# Patient Record
Sex: Female | Born: 1960 | Race: White | Hispanic: No | Marital: Married | Smoking: Former smoker
Health system: Southern US, Community
[De-identification: ages and names within clinical notes are randomized; demographics above are authoritative.]

## PROBLEM LIST (undated history)

## (undated) DIAGNOSIS — R42 Dizziness and giddiness: Secondary | ICD-10-CM

## (undated) DIAGNOSIS — T753XXA Motion sickness, initial encounter: Secondary | ICD-10-CM

## (undated) DIAGNOSIS — J189 Pneumonia, unspecified organism: Secondary | ICD-10-CM

## (undated) DIAGNOSIS — I10 Essential (primary) hypertension: Secondary | ICD-10-CM

## (undated) DIAGNOSIS — L409 Psoriasis, unspecified: Secondary | ICD-10-CM

## (undated) DIAGNOSIS — E785 Hyperlipidemia, unspecified: Secondary | ICD-10-CM

## (undated) DIAGNOSIS — Z972 Presence of dental prosthetic device (complete) (partial): Secondary | ICD-10-CM

## (undated) DIAGNOSIS — M8580 Other specified disorders of bone density and structure, unspecified site: Secondary | ICD-10-CM

## (undated) HISTORY — DX: Hyperlipidemia, unspecified: E78.5

## (undated) HISTORY — DX: Psoriasis, unspecified: L40.9

## (undated) HISTORY — DX: Other specified disorders of bone density and structure, unspecified site: M85.80

## (undated) HISTORY — PX: TUBAL LIGATION: SHX77

---

## 1988-04-09 HISTORY — PX: CHOLECYSTECTOMY: SHX55

## 2004-03-06 ENCOUNTER — Emergency Department: Payer: Self-pay | Admitting: Emergency Medicine

## 2004-03-07 ENCOUNTER — Emergency Department (HOSPITAL_COMMUNITY): Admission: EM | Admit: 2004-03-07 | Discharge: 2004-03-07 | Payer: Self-pay | Admitting: Emergency Medicine

## 2004-03-07 ENCOUNTER — Emergency Department: Payer: Self-pay | Admitting: General Practice

## 2009-06-14 ENCOUNTER — Ambulatory Visit: Payer: Self-pay | Admitting: Family Medicine

## 2010-10-03 ENCOUNTER — Ambulatory Visit: Payer: Self-pay | Admitting: Nephrology

## 2012-12-11 ENCOUNTER — Ambulatory Visit: Payer: Self-pay | Admitting: Family Medicine

## 2013-09-26 ENCOUNTER — Emergency Department: Payer: Self-pay | Admitting: Emergency Medicine

## 2014-06-22 ENCOUNTER — Encounter: Payer: Self-pay | Admitting: General Surgery

## 2014-06-22 ENCOUNTER — Ambulatory Visit (INDEPENDENT_AMBULATORY_CARE_PROVIDER_SITE_OTHER): Payer: BLUE CROSS/BLUE SHIELD | Admitting: General Surgery

## 2014-06-22 VITALS — BP 130/80 | HR 76 | Resp 12 | Ht 64.0 in | Wt 166.0 lb

## 2014-06-22 DIAGNOSIS — Z800 Family history of malignant neoplasm of digestive organs: Secondary | ICD-10-CM

## 2014-06-22 DIAGNOSIS — Z1211 Encounter for screening for malignant neoplasm of colon: Secondary | ICD-10-CM | POA: Diagnosis not present

## 2014-06-22 MED ORDER — POLYETHYLENE GLYCOL 3350 17 GM/SCOOP PO POWD: 1.0000 | Freq: Once | ORAL | Status: DC

## 2014-06-22 NOTE — Progress Notes (Signed)
Patient ID: Tanya Howell, female   DOB: August 03, 1960, 54 y.o.   MRN: [id number]  Chief Complaint  Patient presents with  . Other    screening colonoscopy    HPI Tanya Howell is a 54 y.o. female here today for evaluation of a screening colonoscopy. Patient states she has no GI problems except she noted a change in caliber of stool in recent past. Her brother had colon cancer. HPI  Past Medical History  Diagnosis Date  . Hyperlipidemia     Past Surgical History  Procedure Laterality Date  . Cholecystectomy  1990  . Tubal ligation      Family History  Problem Relation Age of Onset  . Colon cancer Brother     57   . Breast cancer Mother     Social History History  Substance Use Topics  . Smoking status: Former Smoker -- 1.00 packs/day for 28 years    Types: Cigarettes  . Smokeless tobacco: Not on file  . Alcohol Use: Not on file    No Known Allergies  Current Outpatient Prescriptions  Medication Sig Dispense Refill  . docusate sodium (COLACE) 100 MG capsule Take 100 mg by mouth daily.    . meclizine (ANTIVERT) 25 MG tablet Take 25 mg by mouth as needed for dizziness.    . polyethylene glycol powder (GLYCOLAX/MIRALAX) powder Take 255 g by mouth once. 255 g 0   No current facility-administered medications for this visit.    Review of Systems Review of Systems  Constitutional: Negative.   Respiratory: Negative.   Cardiovascular: Negative.   Gastrointestinal: Positive for constipation. Negative for nausea, vomiting, abdominal pain, diarrhea, blood in stool, abdominal distention, anal bleeding and rectal pain.    Blood pressure 130/80, pulse 76, resp. rate 12, height 5\' 4"  (1.626 m), weight 166 lb (75.297 kg).  Physical Exam Physical Exam  Constitutional: She is oriented to person, place, and time. She appears well-developed and well-nourished.  Eyes: Conjunctivae are normal. No scleral icterus.  Neck: Neck supple.  Cardiovascular: Normal rate, regular  rhythm and normal heart sounds.   Pulmonary/Chest: Effort normal and breath sounds normal.  Abdominal: Soft. Bowel sounds are normal. There is no tenderness.  Lymphadenopathy:    She has no cervical adenopathy.  Neurological: She is alert and oriented to person, place, and time.  Skin: Skin is warm and dry.    Data Reviewed Notes reviewed  Assessment FH of colon cancer. Pt needs colonoscopy    Plan    Discussed colonoscopy, procedure, risks and benefits explained. Pt agreeable. Patient is scheduled for a Colonoscopy at [facility] on 07/07/14. She is aware to pre register with the hospital at least 2 days prior. Miralax prescription has been sent into her pharmacy. Patient is aware of date and all instructions.        Keyla Milone G 06/23/2014, 2:58 PM

## 2014-06-22 NOTE — Patient Instructions (Addendum)
Colonoscopy A colonoscopy is an exam to look at the entire large intestine (colon). This exam can help find problems such as tumors, polyps, inflammation, and areas of bleeding. The exam takes about 1 hour.  LET [facility] CARE PROVIDER KNOW ABOUT:   Any allergies you have.  All medicines you are taking, including vitamins, herbs, eye drops, creams, and over-the-counter medicines.  Previous problems you or members of your family have had with the use of anesthetics.  Any blood disorders you have.  Previous surgeries you have had.  Medical conditions you have. RISKS AND COMPLICATIONS  Generally, this is a safe procedure. However, as with any procedure, complications can occur. Possible complications include:  Bleeding.  Tearing or rupture of the colon wall.  Reaction to medicines given during the exam.  Infection (rare). BEFORE THE PROCEDURE   Ask your health care provider about changing or stopping your regular medicines.  You may be prescribed an oral bowel prep. This involves drinking a large amount of medicated liquid, starting the day before your procedure. The liquid will cause you to have multiple loose stools until your stool is almost clear or light green. This cleans out your colon in preparation for the procedure.  Do not eat or drink anything else once you have started the bowel prep, unless your health care provider tells you it is safe to do so.  Arrange for someone to drive you home after the procedure. PROCEDURE   You will be given medicine to help you relax (sedative).  You will lie on your side with your knees bent.  A long, flexible tube with a light and camera on the end (colonoscope) will be inserted through the rectum and into the colon. The camera sends video back to a computer screen as it moves through the colon. The colonoscope also releases carbon dioxide gas to inflate the colon. This helps your health care provider see the area better.  During  the exam, your health care provider may take a small tissue sample (biopsy) to be examined under a microscope if any abnormalities are found.  The exam is finished when the entire colon has been viewed. AFTER THE PROCEDURE   Do not drive for 24 hours after the exam.  You may have a small amount of blood in your stool.  You may pass moderate amounts of gas and have mild abdominal cramping or bloating. This is caused by the gas used to inflate your colon during the exam.  Ask when your test results will be ready and how you will get your results. Make sure you get your test results. Document Released: 03/23/2000 Document Revised: 01/14/2013 Document Reviewed: 12/01/2012 [facility] Patient Information 2015 [place], [place]. This information is not intended to replace advice given to you by your health care provider. Make sure you discuss any questions you have with your health care provider.  Patient is scheduled for a Colonoscopy at [facility] on 07/07/14. She is aware to pre register with the hospital at least 2 days prior. Miralax prescription has been sent into her pharmacy. Patient is aware of date and all instructions.

## 2014-06-23 ENCOUNTER — Encounter: Payer: Self-pay | Admitting: General Surgery

## 2014-06-30 ENCOUNTER — Other Ambulatory Visit: Payer: Self-pay | Admitting: General Surgery

## 2014-06-30 DIAGNOSIS — Z1211 Encounter for screening for malignant neoplasm of colon: Secondary | ICD-10-CM

## 2014-07-07 ENCOUNTER — Ambulatory Visit
Admit: 2014-07-07 | Disposition: A | Discharge status: Home / Self Care | Payer: Self-pay | Attending: General Surgery | Admitting: General Surgery

## 2014-07-07 DIAGNOSIS — D125 Benign neoplasm of sigmoid colon: Secondary | ICD-10-CM | POA: Diagnosis not present

## 2014-07-07 DIAGNOSIS — Z1211 Encounter for screening for malignant neoplasm of colon: Secondary | ICD-10-CM | POA: Diagnosis not present

## 2014-07-09 ENCOUNTER — Encounter: Payer: Self-pay | Admitting: General Surgery

## 2014-07-13 ENCOUNTER — Telehealth: Payer: Self-pay | Admitting: *Deleted

## 2014-07-13 NOTE — Telephone Encounter (Signed)
-----   Message from Christene Lye, MD sent at 07/09/2014 12:48 PM EDT ----- Please let pt pt know the pathology was normal. She will need colonoscopy in 5 yrs

## 2014-07-13 NOTE — Telephone Encounter (Signed)
Pt placed in recalls.

## 2014-07-13 NOTE — Telephone Encounter (Signed)
Pt aware for results and that she needs to repeat colonoscopy in 5 years, pt placed in recalls

## 2014-08-02 LAB — SURGICAL PATHOLOGY

## 2014-11-01 ENCOUNTER — Telehealth: Payer: Self-pay | Admitting: Family Medicine

## 2014-11-01 MED ORDER — SCOPOLAMINE 1 MG/3DAYS TD PT72: 1.0000 | MEDICATED_PATCH | TRANSDERMAL | Status: DC

## 2014-11-01 NOTE — Telephone Encounter (Signed)
okay

## 2014-11-01 NOTE — Telephone Encounter (Signed)
PT CALLED IN REQUESTING PATCH FOR MOTION SICKNESS. SHE WOULD LIKE IT TO GO TO [address].

## 2015-11-01 ENCOUNTER — Other Ambulatory Visit: Payer: Self-pay | Admitting: Family Medicine

## 2015-11-01 ENCOUNTER — Encounter: Payer: Self-pay | Admitting: Family Medicine

## 2015-11-01 ENCOUNTER — Ambulatory Visit (INDEPENDENT_AMBULATORY_CARE_PROVIDER_SITE_OTHER): Payer: BLUE CROSS/BLUE SHIELD | Admitting: Family Medicine

## 2015-11-01 DIAGNOSIS — Z1239 Encounter for other screening for malignant neoplasm of breast: Secondary | ICD-10-CM | POA: Diagnosis not present

## 2015-11-01 DIAGNOSIS — Z0000 Encounter for general adult medical examination without abnormal findings: Secondary | ICD-10-CM | POA: Diagnosis not present

## 2015-11-01 DIAGNOSIS — Z124 Encounter for screening for malignant neoplasm of cervix: Secondary | ICD-10-CM | POA: Insufficient documentation

## 2015-11-01 DIAGNOSIS — N889 Noninflammatory disorder of cervix uteri, unspecified: Secondary | ICD-10-CM | POA: Insufficient documentation

## 2015-11-01 DIAGNOSIS — M8580 Other specified disorders of bone density and structure, unspecified site: Secondary | ICD-10-CM

## 2015-11-01 HISTORY — DX: Other specified disorders of bone density and structure, unspecified site: M85.80

## 2015-11-01 NOTE — Assessment & Plan Note (Signed)
Order DEXA, weight-bearing exercise, 3 servings calcium daily; 1000 iu vit D3 daily when not outdoors

## 2015-11-01 NOTE — Assessment & Plan Note (Signed)
Order mammo 

## 2015-11-01 NOTE — Assessment & Plan Note (Signed)
Refer to GYN. 

## 2015-11-01 NOTE — Progress Notes (Signed)
Patient ID: Tanya Howell, female   DOB: May 20, 1960, 55 y.o.   MRN: [id number]   Subjective:   Tanya Howell is a 55 y.o. female here for a complete physical exam  Interim issues since last visit: had some dental surgery, bottom row pulled; now all dentures; fitting fair, new since May  USPSTF grade A and B recommendations Alcohol: no Depression:  Depression screen [facility] 2/9 11/01/2015  Decreased Interest 0  Down, Depressed, Hopeless 0  PHQ - 2 Score 0   Hypertension: controlled Obesity: overweight, not obese; weight loss since last visit Tobacco use: former smoker; quit 2011 HIV, hep B, hep C: declined STD testing and prevention (chl/gon/syphilis): declined Lipids: today Glucose: today, last in Jan 103 Colorectal cancer: just done 2016, next due 2021 Breast cancer: order BRCA gene screening:no ovarian cancer Intimate partner violence: no Cervical cancer screening: due today Lung cancer: start low dose chest CT at age 70 Osteoporosis: no steroids; osteopenia previously, order DEXA Fall prevention/vitamin D: start 1000 iu  AAA: n/a Aspirin: n/a Diet: room for improvement Exercise: no regular exercise, but goes hiking, tries to stay active Skin cancer: spots on back   Past Medical History:  Diagnosis Date  . Hyperlipidemia   . Osteopenia 11/01/2015   Past Surgical History:  Procedure Laterality Date  . CHOLECYSTECTOMY  1990  . TUBAL LIGATION    MD notes: dental extraction  Family History  Problem Relation Age of Onset  . Colon cancer Brother     20   . Breast cancer Mother    Social History  Substance Use Topics  . Smoking status: Former Smoker    Packs/day: 1.00    Years: 28.00    Types: Cigarettes  . Smokeless tobacco: Not on file  . Alcohol use Not on file   Review of Systems  Constitutional: Negative for unexpected weight change (lost some with dentures).  Respiratory: Negative for cough and shortness of breath.   Cardiovascular: Negative for  chest pain and leg swelling.  Gastrointestinal: Negative for blood in stool.  Genitourinary: Negative for hematuria and pelvic pain.  Hematological: Does not bruise/bleed easily.   Objective:   Vitals:   11/01/15 1134  BP: 118/82  Pulse: 85  Resp: 14  Temp: 99.1 F (37.3 C)  TempSrc: Oral  SpO2: 96%  Weight: 158 lb (71.7 kg)   Body mass index is 27.12 kg/m. Wt Readings from Last 3 Encounters:  11/01/15 158 lb (71.7 kg)  06/22/14 166 lb (75.3 kg)   Physical Exam  Constitutional: She appears well-developed and well-nourished.  HENT:  Head: Normocephalic and atraumatic.  Eyes: Conjunctivae and EOM are normal. Right eye exhibits no hordeolum. Left eye exhibits no hordeolum. No scleral icterus.  Neck: Carotid bruit is not present. No thyromegaly present.  Cardiovascular: Normal rate, regular rhythm, S1 normal, S2 normal and normal heart sounds.   No extrasystoles are present.  Pulmonary/Chest: Effort normal and breath sounds normal. No respiratory distress. Right breast exhibits no inverted nipple, no mass, no nipple discharge, no skin change and no tenderness. Left breast exhibits no inverted nipple, no mass, no nipple discharge, no skin change and no tenderness. Breasts are symmetrical.  Abdominal: Soft. Normal appearance and bowel sounds are normal. She exhibits no distension, no abdominal bruit, no pulsatile midline mass and no mass. There is no hepatosplenomegaly. There is no tenderness. No hernia.  Genitourinary: Uterus normal. Pelvic exam was performed with patient prone. There is no rash or lesion on the right labia.  There is no rash or lesion on the left labia. Cervix exhibits no motion tenderness, no discharge and no friability. Right adnexum displays no mass, no tenderness and no fullness. Left adnexum displays no mass, no tenderness and no fullness. No erythema or bleeding in the vagina. No vaginal discharge found.  Genitourinary Comments: Transition zone versus abnormal  tissue at os with what appears to be polyp or irregular erythematous tissue, almost glandular in appearance at the 7 and 8 o'clock region of the cervix  Musculoskeletal: Normal range of motion. She exhibits no edema.  Lymphadenopathy:       Head (right side): No submandibular adenopathy present.       Head (left side): No submandibular adenopathy present.    She has no cervical adenopathy.    She has no axillary adenopathy.  Neurological: She is alert. She displays no tremor. No cranial nerve deficit. She exhibits normal muscle tone. Gait normal.  Skin: Skin is warm and dry. No bruising and no ecchymosis noted. No cyanosis. No pallor.  Psychiatric: Her speech is normal and behavior is normal. Thought content normal. Her mood appears not anxious. She does not exhibit a depressed mood.    Assessment/Plan:   Problem List Items Addressed This Visit      Musculoskeletal and Integument   Osteopenia    Order DEXA, weight-bearing exercise, 3 servings calcium daily; 1000 iu vit D3 daily when not outdoors      Relevant Orders   DG Bone Density     Genitourinary   Abnormal appearance of cervix    Refer to GYN      Relevant Orders   Ambulatory referral to Gynecology     Other   Preventative health care    USPSTF grade A and B recommendations reviewed with patient; age-appropriate recommendations, preventive care, screening tests, etc discussed and encouraged; healthy living encouraged; see AVS for patient education given to patient      Relevant Orders   Comprehensive metabolic panel   Lipid panel   Pap smear for cervical cancer screening    No abnormals previously, pap smear today      Relevant Orders   Pap liquid-based and HPV (high risk)   Breast cancer screening    Order mammo      Relevant Orders   MM DIGITAL SCREENING BILATERAL    Other Visit Diagnoses   None.     No orders of the defined types were placed in this encounter.  Orders Placed This Encounter   Procedures  . DG Bone Density    Order Specific Question:   Reason for Exam (SYMPTOM  OR DIAGNOSIS REQUIRED)    Answer:   screening    Order Specific Question:   Preferred imaging location?    Answer:   [facility]    Order Specific Question:   Is the patient pregnant?    Answer:   No  . MM DIGITAL SCREENING BILATERAL    Standing Status:   Future    Standing Expiration Date:   10/31/2016    Order Specific Question:   Reason for Exam (SYMPTOM  OR DIAGNOSIS REQUIRED)    Answer:   screening    Order Specific Question:   Is the patient pregnant?    Answer:   No    Order Specific Question:   Preferred imaging location?    Answer:   [facility]  . Comprehensive metabolic panel    Order Specific Question:   Has the patient fasted?  Answer:   Yes  . Lipid panel    Order Specific Question:   Has the patient fasted?    Answer:   Yes  . Ambulatory referral to Gynecology    Referral Priority:   Routine    Referral Type:   Consultation    Referral Reason:   Specialty Services Required    Requested Specialty:   Gynecology    Number of Visits Requested:   1   Follow up plan: Return in about 1 year (around 10/31/2016) for complete physical.  An after-visit summary was printed and given to the patient at [facility].  Please see the patient instructions which may contain other information and recommendations beyond what is mentioned above in the assessment and plan.

## 2015-11-01 NOTE — Patient Instructions (Addendum)
I've put in a referral for gynecologist to have them evaluate your cervix We'll get labs today If you have not heard anything from my staff in a week about any orders/referrals/studies from today, please contact us here to follow-up [phone] I'll be happy to see you soon for any other health issues  Health Maintenance, Female Adopting a healthy lifestyle and getting preventive care can go a long way to promote health and wellness. Talk with your health care provider about what schedule of regular examinations is right for you. This is a good chance for you to check in with your provider about disease prevention and staying healthy. In between checkups, there are plenty of things you can do on your own. Experts have done a lot of research about which lifestyle changes and preventive measures are most likely to keep you healthy. Ask your health care provider for more information. WEIGHT AND DIET  Eat a healthy diet  Be sure to include plenty of vegetables, fruits, low-fat dairy products, and lean protein.  Do not eat a lot of foods high in solid fats, added sugars, or salt.  Get regular exercise. This is one of the most important things you can do for your health.  Most adults should exercise for at least 150 minutes each week. The exercise should increase your heart rate and make you sweat (moderate-intensity exercise).  Most adults should also do strengthening exercises at least twice a week. This is in addition to the moderate-intensity exercise.  Maintain a healthy weight  Body mass index (BMI) is a measurement that can be used to identify possible weight problems. It estimates body fat based on height and weight. Your health care provider can help determine your BMI and help you achieve or maintain a healthy weight.  For females 65 years of age and older:   A BMI below 18.5 is considered underweight.  A BMI of 18.5 to 24.9 is normal.  A BMI of 25 to 29.9 is considered  overweight.  A BMI of 30 and above is considered obese.  Watch levels of cholesterol and blood lipids  You should start having your blood tested for lipids and cholesterol at 55 years of age, then have this test every 5 years.  You may need to have your cholesterol levels checked more often if:  Your lipid or cholesterol levels are high.  You are older than 55 years of age.  You are at high risk for heart disease.  CANCER SCREENING   Lung Cancer  Lung cancer screening is recommended for adults 32-57 years old who are at high risk for lung cancer because of a history of smoking.  A yearly low-dose CT scan of the lungs is recommended for people who:  Currently smoke.  Have quit within the past 15 years.  Have at least a 30-pack-year history of smoking. A pack year is smoking an average of one pack of cigarettes a day for 1 year.  Yearly screening should continue until it has been 15 years since you quit.  Yearly screening should stop if you develop a health problem that would prevent you from having lung cancer treatment.  Breast Cancer  Practice breast self-awareness. This means understanding how your breasts normally appear and feel.  It also means doing regular breast self-exams. Let your health care provider know about any changes, no matter how small.  If you are in your 20s or 30s, you should have a clinical breast exam (CBE) by a health  care provider every 1-3 years as part of a regular health exam.  If you are 74 or older, have a CBE every year. Also consider having a breast X-ray (mammogram) every year.  If you have a family history of breast cancer, talk to your health care provider about genetic screening.  If you are at high risk for breast cancer, talk to your health care provider about having an MRI and a mammogram every year.  Breast cancer gene (BRCA) assessment is recommended for women who have family members with BRCA-related cancers. BRCA-related  cancers include:  Breast.  Ovarian.  Tubal.  Peritoneal cancers.  Results of the assessment will determine the need for genetic counseling and BRCA1 and BRCA2 testing. Cervical Cancer Your health care provider may recommend that you be screened regularly for cancer of the pelvic organs (ovaries, uterus, and vagina). This screening involves a pelvic examination, including checking for microscopic changes to the surface of your cervix (Pap test). You may be encouraged to have this screening done every 3 years, beginning at age 69.  For women ages 62-65, health care providers may recommend pelvic exams and Pap testing every 3 years, or they may recommend the Pap and pelvic exam, combined with testing for human papilloma virus (HPV), every 5 years. Some types of HPV increase your risk of cervical cancer. Testing for HPV may also be done on women of any age with unclear Pap test results.  Other health care providers may not recommend any screening for nonpregnant women who are considered low risk for pelvic cancer and who do not have symptoms. Ask your health care provider if a screening pelvic exam is right for you.  If you have had past treatment for cervical cancer or a condition that could lead to cancer, you need Pap tests and screening for cancer for at least 20 years after your treatment. If Pap tests have been discontinued, your risk factors (such as having a new sexual partner) need to be reassessed to determine if screening should resume. Some women have medical problems that increase the chance of getting cervical cancer. In these cases, your health care provider may recommend more frequent screening and Pap tests. Colorectal Cancer  This type of cancer can be detected and often prevented.  Routine colorectal cancer screening usually begins at 55 years of age and continues through 55 years of age.  Your health care provider may recommend screening at an earlier age if you have risk  factors for colon cancer.  Your health care provider may also recommend using home test kits to check for hidden blood in the stool.  A small camera at the end of a tube can be used to examine your colon directly (sigmoidoscopy or colonoscopy). This is done to check for the earliest forms of colorectal cancer.  Routine screening usually begins at age 53.  Direct examination of the colon should be repeated every 5-10 years through 55 years of age. However, you may need to be screened more often if early forms of precancerous polyps or small growths are found. Skin Cancer  Check your skin from head to toe regularly.  Tell your health care provider about any new moles or changes in moles, especially if there is a change in a mole's shape or color.  Also tell your health care provider if you have a mole that is larger than the size of a pencil eraser.  Always use sunscreen. Apply sunscreen liberally and repeatedly throughout the day.  Protect  yourself by wearing long sleeves, pants, a wide-brimmed hat, and sunglasses whenever you are outside. HEART DISEASE, DIABETES, AND HIGH BLOOD PRESSURE   High blood pressure causes heart disease and increases the risk of stroke. High blood pressure is more likely to develop in:  People who have blood pressure in the high end of the normal range (130-139/85-89 mm Hg).  People who are overweight or obese.  People who are African American.  If you are 54-24 years of age, have your blood pressure checked every 3-5 years. If you are 27 years of age or older, have your blood pressure checked every year. You should have your blood pressure measured twice--once when you are at a hospital or clinic, and once when you are not at a hospital or clinic. Record the average of the two measurements. To check your blood pressure when you are not at a hospital or clinic, you can use:  An automated blood pressure machine at a pharmacy.  A home blood pressure  monitor.  If you are between 57 years and 2 years old, ask your health care provider if you should take aspirin to prevent strokes.  Have regular diabetes screenings. This involves taking a blood sample to check your fasting blood sugar level.  If you are at a normal weight and have a low risk for diabetes, have this test once every three years after 55 years of age.  If you are overweight and have a high risk for diabetes, consider being tested at a younger age or more often. PREVENTING INFECTION  Hepatitis B  If you have a higher risk for hepatitis B, you should be screened for this virus. You are considered at high risk for hepatitis B if:  You were born in a country where hepatitis B is common. Ask your health care provider which countries are considered high risk.  Your parents were born in a high-risk country, and you have not been immunized against hepatitis B (hepatitis B vaccine).  You have HIV or AIDS.  You use needles to inject street drugs.  You live with someone who has hepatitis B.  You have had sex with someone who has hepatitis B.  You get hemodialysis treatment.  You take certain medicines for conditions, including cancer, organ transplantation, and autoimmune conditions. Hepatitis C  Blood testing is recommended for:  Everyone born from [birth year] through 1965.  Anyone with known risk factors for hepatitis C. Sexually transmitted infections (STIs)  You should be screened for sexually transmitted infections (STIs) including gonorrhea and chlamydia if:  You are sexually active and are younger than 55 years of age.  You are older than 55 years of age and your health care provider tells you that you are at risk for this type of infection.  Your sexual activity has changed since you were last screened and you are at an increased risk for chlamydia or gonorrhea. Ask your health care provider if you are at risk.  If you do not have HIV, but are at risk, it may be  recommended that you take a prescription medicine daily to prevent HIV infection. This is called pre-exposure prophylaxis (PrEP). You are considered at risk if:  You are sexually active and do not regularly use condoms or know the HIV status of your partner(s).  You take drugs by injection.  You are sexually active with a partner who has HIV. Talk with your health care provider about whether you are at high risk of being infected with  HIV. If you choose to begin PrEP, you should first be tested for HIV. You should then be tested every 3 months for as long as you are taking PrEP.  PREGNANCY   If you are premenopausal and you may become pregnant, ask your health care provider about preconception counseling.  If you may become pregnant, take 400 to 800 micrograms (mcg) of folic acid every day.  If you want to prevent pregnancy, talk to your health care provider about birth control (contraception). OSTEOPOROSIS AND MENOPAUSE   Osteoporosis is a disease in which the bones lose minerals and strength with aging. This can result in serious bone fractures. Your risk for osteoporosis can be identified using a bone density scan.  If you are 72 years of age or older, or if you are at risk for osteoporosis and fractures, ask your health care provider if you should be screened.  Ask your health care provider whether you should take a calcium or vitamin D supplement to lower your risk for osteoporosis.  Menopause may have certain physical symptoms and risks.  Hormone replacement therapy may reduce some of these symptoms and risks. Talk to your health care provider about whether hormone replacement therapy is right for you.  HOME CARE INSTRUCTIONS   Schedule regular health, dental, and eye exams.  Stay current with your immunizations.   Do not use any tobacco products including cigarettes, chewing tobacco, or electronic cigarettes.  If you are pregnant, do not drink alcohol.  If you are  breastfeeding, limit how much and how often you drink alcohol.  Limit alcohol intake to no more than 1 drink per day for nonpregnant women. One drink equals 12 ounces of beer, 5 ounces of wine, or 1 ounces of hard liquor.  Do not use street drugs.  Do not share needles.  Ask your health care provider for help if you need support or information about quitting drugs.  Tell your health care provider if you often feel depressed.  Tell your health care provider if you have ever been abused or do not feel safe at home.   This information is not intended to replace advice given to you by your health care provider. Make sure you discuss any questions you have with your health care provider.   Document Released: 10/09/2010 Document Revised: 04/16/2014 Document Reviewed: 02/25/2013 Elsevier Interactive Patient Education [employer].

## 2015-11-01 NOTE — Assessment & Plan Note (Signed)
USPSTF grade A and B recommendations reviewed with patient; age-appropriate recommendations, preventive care, screening tests, etc discussed and encouraged; healthy living encouraged; see AVS for patient education given to patient  

## 2015-11-01 NOTE — Assessment & Plan Note (Signed)
No abnormals previously, pap smear today

## 2015-11-02 LAB — COMPREHENSIVE METABOLIC PANEL
ALT: 14 U/L (ref 6–29)
AST: 13 U/L (ref 10–35)
Albumin: 4.4 g/dL (ref 3.6–5.1)
Alkaline Phosphatase: 52 U/L (ref 33–130)
BUN: 13 mg/dL (ref 7–25)
CHLORIDE: 105 mmol/L (ref 98–110)
CO2: 26 mmol/L (ref 20–31)
CREATININE: 0.89 mg/dL (ref 0.50–1.05)
Calcium: 9.3 mg/dL (ref 8.6–10.4)
Glucose, Bld: 81 mg/dL (ref 65–99)
POTASSIUM: 4.5 mmol/L (ref 3.5–5.3)
SODIUM: 140 mmol/L (ref 135–146)
Total Bilirubin: 0.4 mg/dL (ref 0.2–1.2)
Total Protein: 6.7 g/dL (ref 6.1–8.1)

## 2015-11-02 LAB — LIPID PANEL
CHOLESTEROL: 203 mg/dL — AB (ref 125–200)
HDL: 51 mg/dL (ref >=46)
LDL Cholesterol: 123 mg/dL (ref <130)
TRIGLYCERIDES: 146 mg/dL (ref <150)
Total CHOL/HDL Ratio: 4 Ratio (ref <=5.0)
VLDL: 29 mg/dL (ref <30)

## 2015-11-07 ENCOUNTER — Telehealth: Payer: Self-pay | Admitting: Family Medicine

## 2015-11-08 DIAGNOSIS — Z0000 Encounter for general adult medical examination without abnormal findings: Secondary | ICD-10-CM | POA: Diagnosis not present

## 2015-11-08 LAB — PAP IG AND HPV HIGH-RISK: HPV DNA HIGH RISK: NOT DETECTED

## 2015-11-09 LAB — COMPREHENSIVE METABOLIC PANEL
ALK PHOS: 55 U/L (ref 33–130)
ALT: 17 U/L (ref 6–29)
AST: 18 U/L (ref 10–35)
Albumin: 4.5 g/dL (ref 3.6–5.1)
BILIRUBIN TOTAL: 0.4 mg/dL (ref 0.2–1.2)
BUN: 12 mg/dL (ref 7–25)
CALCIUM: 9.8 mg/dL (ref 8.6–10.4)
CO2: 25 mmol/L (ref 20–31)
Chloride: 104 mmol/L (ref 98–110)
Creat: 0.99 mg/dL (ref 0.50–1.05)
GLUCOSE: 99 mg/dL (ref 65–99)
POTASSIUM: 4.6 mmol/L (ref 3.5–5.3)
Sodium: 138 mmol/L (ref 135–146)
TOTAL PROTEIN: 6.7 g/dL (ref 6.1–8.1)

## 2015-11-09 LAB — LIPID PANEL
CHOL/HDL RATIO: 4.6 ratio (ref <=5.0)
CHOLESTEROL: 225 mg/dL — AB (ref 125–200)
HDL: 49 mg/dL (ref >=46)
LDL CALC: 139 mg/dL — AB (ref <130)
Triglycerides: 186 mg/dL — ABNORMAL HIGH (ref <150)
VLDL: 37 mg/dL — AB (ref <30)

## 2015-11-22 NOTE — Telephone Encounter (Signed)
DONE

## 2015-11-24 ENCOUNTER — Ambulatory Visit
Admission: RE | Admit: 2015-11-24 | Discharge: 2015-11-24 | Disposition: A | Discharge status: Home / Self Care | Payer: BLUE CROSS/BLUE SHIELD | Source: Ambulatory Visit | Attending: Family Medicine | Admitting: Family Medicine

## 2015-11-24 ENCOUNTER — Other Ambulatory Visit: Payer: Self-pay | Admitting: Family Medicine

## 2015-11-24 DIAGNOSIS — M8588 Other specified disorders of bone density and structure, other site: Secondary | ICD-10-CM | POA: Diagnosis not present

## 2015-11-24 DIAGNOSIS — Z13820 Encounter for screening for osteoporosis: Secondary | ICD-10-CM | POA: Insufficient documentation

## 2015-11-24 DIAGNOSIS — Z1239 Encounter for other screening for malignant neoplasm of breast: Secondary | ICD-10-CM

## 2015-11-24 DIAGNOSIS — Z780 Asymptomatic menopausal state: Secondary | ICD-10-CM | POA: Diagnosis not present

## 2015-11-24 DIAGNOSIS — Z1231 Encounter for screening mammogram for malignant neoplasm of breast: Secondary | ICD-10-CM | POA: Diagnosis not present

## 2015-11-28 ENCOUNTER — Ambulatory Visit: Payer: BLUE CROSS/BLUE SHIELD | Admitting: Family Medicine

## 2015-11-30 ENCOUNTER — Encounter: Payer: Self-pay | Admitting: Family Medicine

## 2015-11-30 ENCOUNTER — Ambulatory Visit (INDEPENDENT_AMBULATORY_CARE_PROVIDER_SITE_OTHER): Payer: BLUE CROSS/BLUE SHIELD | Admitting: Family Medicine

## 2015-11-30 DIAGNOSIS — L409 Psoriasis, unspecified: Secondary | ICD-10-CM

## 2015-11-30 DIAGNOSIS — E663 Overweight: Secondary | ICD-10-CM

## 2015-11-30 DIAGNOSIS — R42 Dizziness and giddiness: Secondary | ICD-10-CM

## 2015-11-30 DIAGNOSIS — L920 Granuloma annulare: Secondary | ICD-10-CM | POA: Diagnosis not present

## 2015-11-30 MED ORDER — CLOBETASOL PROPIONATE 0.05 % EX OINT: 1.0000 "application " | TOPICAL_OINTMENT | Freq: Two times a day (BID) | CUTANEOUS | 3 refills | Status: DC | PRN

## 2015-11-30 MED ORDER — MECLIZINE HCL 25 MG PO TABS: 25.0000 mg | ORAL_TABLET | Freq: Three times a day (TID) | ORAL | 3 refills | Status: DC | PRN

## 2015-11-30 NOTE — Assessment & Plan Note (Signed)
Try clobetasol on this area BID for 1-2 weeks; if not getting better in 2 weeks, refer to derm

## 2015-11-30 NOTE — Progress Notes (Signed)
BP 114/70   Pulse 83   Temp 98.7 F (37.1 C) (Oral)   Resp 14   Wt 158 lb 14.4 oz (72.1 kg)   SpO2 98%   BMI 27.28 kg/m    Subjective:    Patient ID: Tanya Howell, female    DOB: 1960-09-22, 55 y.o.   MRN: [id number]  HPI: Tanya Howell is a 55 y.o. female  Chief Complaint  Patient presents with  . Medication Refill    rash   Psoriasis; has had it for five years; not on scalp; usually on knees; may get patch on hands; nail changes; clobetasol helps  Intermittent inner ear and vertigo; meclizine helps  She has been working with life coach, has really improved her overall health through work; BMI improved  She has issues with stuff on her back, thinks it is a fungal thing; looks like ringworm, tried antifungal but it helped just a little bit; sometimes itching no scale and no redness of feet; no lumbar support   Relevant past medical, surgical, family and social history reviewed Past Medical History:  Diagnosis Date  . Hyperlipidemia   . Osteopenia 11/01/2015   Past Surgical History:  Procedure Laterality Date  . CHOLECYSTECTOMY  1990  . TUBAL LIGATION     Family History  Problem Relation Age of Onset  . Colon cancer Brother     33   . Breast cancer Mother   . Breast cancer Maternal Aunt    Social History  Substance Use Topics  . Smoking status: Former Smoker    Packs/day: 1.00    Years: 28.00    Types: Cigarettes  . Smokeless tobacco: Not on file  . Alcohol use Not on file  quit smoking Aug 2011  Interim medical history since last visit reviewed. Allergies and medications reviewed  Review of Systems Per HPI unless specifically indicated above     Objective:    BP 114/70   Pulse 83   Temp 98.7 F (37.1 C) (Oral)   Resp 14   Wt 158 lb 14.4 oz (72.1 kg)   SpO2 98%   BMI 27.28 kg/m   Wt Readings from Last 3 Encounters:  11/30/15 158 lb 14.4 oz (72.1 kg)  11/01/15 158 lb (71.7 kg)  06/22/14 166 lb (75.3 kg)    Physical Exam    Constitutional: She appears well-developed and well-nourished.  Overweight; weight down 8 pounds over last 5 months  HENT:  Right Ear: Hearing, tympanic membrane, external ear and ear canal normal.  Left Ear: Hearing, tympanic membrane, external ear and ear canal normal.  Nose: No rhinorrhea.  Mouth/Throat: Oropharynx is clear and moist.  Cardiovascular: Normal rate.   Pulmonary/Chest: Effort normal.  Skin: Lesion (several lesions on the lower back across left and right sides, left a little more than right, but easily cross the midline; not vesicular; slightly brownish-red, most are nummular, less than dime-sized; no discrete scale) noted.  Nail changes c/w psoriasis  Psychiatric: She has a normal mood and affect.    Results for orders placed or performed in visit on 11/01/15  Comprehensive metabolic panel  Result Value Ref Range   Sodium 140 135 - 146 mmol/L   Potassium 4.5 3.5 - 5.3 mmol/L   Chloride 105 98 - 110 mmol/L   CO2 26 20 - 31 mmol/L   Glucose, Bld 81 65 - 99 mg/dL   BUN 13 7 - 25 mg/dL   Creat 0.89 0.50 - 1.05 mg/dL  Total Bilirubin 0.4 0.2 - 1.2 mg/dL   Alkaline Phosphatase 52 33 - 130 U/L   AST 13 10 - 35 U/L   ALT 14 6 - 29 U/L   Total Protein 6.7 6.1 - 8.1 g/dL   Albumin 4.4 3.6 - 5.1 g/dL   Calcium 9.3 8.6 - 10.4 mg/dL  Lipid panel  Result Value Ref Range   Cholesterol 203 (H) 125 - 200 mg/dL   Triglycerides 146 <150 mg/dL   HDL 51 >=46 mg/dL   Total CHOL/HDL Ratio 4.0 <=5.0 Ratio   VLDL 29 <30 mg/dL   LDL Cholesterol 123 <130 mg/dL      Assessment & Plan:   Problem List Items Addressed This Visit      Musculoskeletal and Integument   Psoriasis    May use clobetasol for psoriatic lesions; cream is too strong for face, axilla, groin      Granuloma annulare    Try clobetasol on this area BID for 1-2 weeks; if not getting better in 2 weeks, refer to derm        Other   Vertigo    May use meclizine PRN      Overweight (BMI 25.0-29.9)     Praise given for patient's healthy lifestyle changes; she is trying to lose weight and eat better       Other Visit Diagnoses   None.      Follow up plan: Return if symptoms worsen or fail to improve.  An after-visit summary was printed and given to the patient at [facility].  Please see the patient instructions which may contain other information and recommendations beyond what is mentioned above in the assessment and plan.  Meds ordered this encounter  Medications  . clobetasol ointment (TEMOVATE) 0.05 %    Sig: Apply 1 application topically 2 (two) times daily as needed.    Dispense:  30 g    Refill:  3  . meclizine (ANTIVERT) 25 MG tablet    Sig: Take 1 tablet (25 mg total) by mouth 3 (three) times daily as needed for dizziness.    Dispense:  30 tablet    Refill:  3    No orders of the defined types were placed in this encounter.

## 2015-11-30 NOTE — Patient Instructions (Signed)
Use the clobetasol on the rash on your back and call me if not resolving in 2 weeks Keep up the good job with your lifestyle changes

## 2015-12-03 DIAGNOSIS — R42 Dizziness and giddiness: Secondary | ICD-10-CM | POA: Insufficient documentation

## 2015-12-03 DIAGNOSIS — L409 Psoriasis, unspecified: Secondary | ICD-10-CM | POA: Insufficient documentation

## 2015-12-03 DIAGNOSIS — E663 Overweight: Secondary | ICD-10-CM | POA: Insufficient documentation

## 2015-12-03 NOTE — Assessment & Plan Note (Signed)
Praise given for patient's healthy lifestyle changes; she is trying to lose weight and eat better

## 2015-12-03 NOTE — Assessment & Plan Note (Signed)
May use clobetasol for psoriatic lesions; cream is too strong for face, axilla, groin

## 2015-12-03 NOTE — Assessment & Plan Note (Signed)
May use meclizine PRN

## 2015-12-13 ENCOUNTER — Ambulatory Visit (INDEPENDENT_AMBULATORY_CARE_PROVIDER_SITE_OTHER): Payer: BLUE CROSS/BLUE SHIELD | Admitting: Obstetrics and Gynecology

## 2015-12-13 ENCOUNTER — Encounter: Payer: Self-pay | Admitting: Obstetrics and Gynecology

## 2015-12-13 VITALS — BP 133/78 | HR 80 | Ht 64.5 in | Wt 158.0 lb

## 2015-12-13 DIAGNOSIS — N841 Polyp of cervix uteri: Secondary | ICD-10-CM

## 2015-12-13 NOTE — Progress Notes (Signed)
GYN ENCOUNTER NOTE  Subjective:       Tanya Howell is a 55 y.o. G46P1001 female is here for gynecologic evaluation of the following issues:  1. Abnormal appearing cervix-referral from Dr Sanda Klein  Patient reports no history of abnormal Pap smears. Patient is not experiencing any abnormal uterine bleeding or vaginal discharge. Patient is not experiencing any pelvic pain or pain with intercourse. Lesion on cervix was noted at time of routine physical.     Gynecologic History No LMP recorded. Patient is postmenopausal.  Obstetric History OB History  Gravida Para Term Preterm AB Living  1 1 1     1   SAB TAB Ectopic Multiple Live Births          1    # Outcome Date GA Lbr Len/2nd Weight Sex Delivery Anes PTL Lv  1 Term 1986   6 lb (2.722 kg) F Vag-Spont   LIV      Past Medical History:  Diagnosis Date  . Hyperlipidemia   . Osteopenia 11/01/2015  . Psoriasis     Past Surgical History:  Procedure Laterality Date  . CHOLECYSTECTOMY  1990  . TUBAL LIGATION      Current Outpatient Prescriptions on File Prior to Visit  Medication Sig Dispense Refill  . clobetasol ointment (TEMOVATE) [id number] % Apply 1 application topically 2 (two) times daily as needed. 30 g 3  . meclizine (ANTIVERT) 25 MG tablet Take 1 tablet (25 mg total) by mouth 3 (three) times daily as needed for dizziness. 30 tablet 3   No current facility-administered medications on file prior to visit.     No Known Allergies  Social History   Social History  . Marital status: Married    Spouse name: N/A  . Number of children: N/A  . Years of education: N/A   Occupational History  . Not on file.   Social History Main Topics  . Smoking status: Former Smoker    Packs/day: 1.00    Years: 28.00    Types: Cigarettes  . Smokeless tobacco: Not on file  . Alcohol use No  . Drug use: No  . Sexual activity: Yes    Birth control/ protection: Surgical   Other Topics Concern  . Not on file   Social History  Narrative  . No narrative on file    Family History  Problem Relation Age of Onset  . Colon cancer Brother     63   . Breast cancer Mother   . Breast cancer Maternal Aunt     The following portions of the patient's history were reviewed and updated as appropriate: allergies, current medications, past family history, past medical history, past social history, past surgical history and problem list.  Review of Systems Review of Systems -per history of present illness Aunt Objective:   BP 133/78   Pulse 80   Ht 5' 4.5" (1.638 m)   Wt 158 lb (71.7 kg)   BMI 26.70 kg/m  CONSTITUTIONAL: Well-developed, well-nourished female in no acute distress.  HENT:  Normocephalic, atraumatic.  NECK: Not examined SKIN: Skin is warm and dry. No rash noted. Not diaphoretic. No erythema. No pallor. [place]: Alert and oriented to person, place, and time. PSYCHIATRIC: Normal mood and affect. Normal behavior. Normal judgment and thought content. CARDIOVASCULAR:Not Examined RESPIRATORY: Not Examined BREASTS: Not Examined ABDOMEN: Soft, non distended; Non tender.  No Organomegaly. PELVIC:  External Genitalia: Normal  BUS: Normal  Vagina: Normal  Cervix: 7 mm vascular cystic follow-up at os;  10 cm fleshy polyp at os  Uterus: Normal size, shape,consistency, mobile  Adnexa: Normal  RV: Normal   Bladder: Nontender MUSCULOSKELETAL: Normal range of motion. No tenderness.  No cyanosis, clubbing, or edema.   PROCEDURE: Cervical biopsy 2 Verbal consent is obtained. Patient was placed in the dorsal lithotomy position. Speculum was placed to provide visualization of the cervix. The 2 polyps were then removed using Tischler biopsy forceps with multiple biopsies. The 2 separate polyps were sent in separate pathology containers. Monsel solution was applied for hemostasis. Blood loss-minimal. Procedure was well-tolerated without complication.  Assessment:    1.  Cervical polyp 2 (7 mm vascular cystic  polyp; 10 mm fleshy polyp)  Plan:   1. Cervical biopsy 2 as noted 2. Post biopsy instructions given 3. Biopsy results will be made available to patient along with further instructions for follow-up.  Brayton Mars, MD  Note: This dictation was prepared with Dragon dictation along with smaller phrase technology. Any transcriptional errors that result from this process are unintentional.

## 2015-12-13 NOTE — Patient Instructions (Signed)
Cervical Biopsy, Care After Refer to this sheet in the next few weeks. These instructions provide you with information about caring for yourself after your procedure. Your health care provider may also give you more specific instructions. Your treatment has been planned according to current medical practices, but problems sometimes occur. Call your health care provider if you have any problems or questions after your procedure. WHAT TO EXPECT AFTER THE PROCEDURE After your procedure, it is common to have:  Cramping or mild pain for a few days. Slight bleeding from the vagina for a few days. Dark-colored vaginal discharge for a few days. HOME CARE INSTRUCTIONS Take over-the-counter and prescription medicines only as told by your health care provider. Return to your normal activities as told by your health care provider. Ask your health care provider what activities are safe for you. Use a sanitary napkin until bleeding and discharge stop. Do not use tampons until your health care provider approves. Do not douche until your health care provider approves. Do not have sex until your health care provider approves. Keep all follow-up visits as told by your health care provider. This is important. SEEK MEDICAL CARE IF:  You have a fever or chills. You have bad-smelling vaginal discharge. You have itching or irritation around the vagina. You have lower abdominal pain. SEEK IMMEDIATE MEDICAL CARE IF:  You develop heavy vaginal bleeding that soaks more than one sanitary pad an hour. You faint. You have very bad lower abdominal pain.   This information is not intended to replace advice given to you by your health care provider. Make sure you discuss any questions you have with your health care provider.   Document Released: 12/15/2014 Document Reviewed: 08/11/2014 Elsevier Interactive Patient Education 2016 [employer]   Cervical Biopsy A cervical biopsy is a procedure to remove a small  sample of tissue from the cervix. The cervix is the lowest part of the womb (uterus), which opens into the vagina (birth canal). You may have this procedure to check for cancer or growths that may become cancer. This procedure may also be done if the results of your Pap test were abnormal or if your health care provider saw an abnormality during a pelvic exam. LET [facility] CARE PROVIDER KNOW ABOUT:   Any allergies you have.   All medicines you are taking, including vitamins, herbs, eye drops, creams, and over-the-counter medicines.  Previous problems you or members of your family have had with the use of anesthetics.   Any blood disorders you have.  Previous surgeries you have had.  Any medical conditions you have.  Whether you are pregnant or may be pregnant.  Whether you are having your menstrual period or will be having your period at the time of the procedure. RISKS AND COMPLICATIONS Generally, this is a safe procedure. However, problems may occur, including:  Infection.  Bleeding.  Allergic reactions to medicines or dyes.  Damage to other structures or organs. BEFORE THE PROCEDURE  Do not douche, have sex, use tampons, or use any vaginal medicines before the procedure as told by your health care provider.  Follow instructions from your health care provider about eating or drinking restrictions.  Ask your health care provider about:  Changing or stopping your regular medicines. This is especially important if you are taking diabetes medicines or blood thinners.  Taking medicines such as aspirin and ibuprofen. These medicines can thin your blood. Do not take these medicines before your procedure if your health care provider instructs  you not to.  You may be given an over-the-counter pain medicine to take right before the procedure.  You may be asked to empty your bladder and bowel right before the procedure. PROCEDURE   You will undress from the waist  down.  You will lie on an examining table and put your feet in stirrups.  To reduce your risk of infection:  Your health care team will wash or sanitize their hands.  Your skin will be washed with soap.  Your health care provider will use a lubricated instrument (speculum) to open your vagina. An instrument that has a magnifying lens and a light (colposcope) will let your health care provider examine the cervix more closely.  You may be given a medicine to numb the area (local anesthetic).  Your health care provider will apply a solution to your cervix. This turns abnormal areas a pale color.  Your health care provider will use an instrument (biopsy forceps) to take one or more small pieces of tissue that appear abnormal.  If there seems to be an abnormal area in the part of your cervix that leads to the uterus (endocervical canal), your health care provider will use an instrument (curette) to scrape tissue from that area. This is called endocervical curettage.  Your health care provider will apply a paste over the biopsy areas to help control bleeding. The procedure may vary among health care providers and hospitals. AFTER THE PROCEDURE It is your responsibility to get the results of your procedure. Ask your health care provider or the department performing the procedure when your results will be ready.   This information is not intended to replace advice given to you by your health care provider. Make sure you discuss any questions you have with your health care provider.   Document Released: 03/26/2005 Document Revised: 12/15/2014 Document Reviewed: 08/11/2014 Elsevier Interactive Patient Education [employer].

## 2015-12-14 DIAGNOSIS — N841 Polyp of cervix uteri: Secondary | ICD-10-CM | POA: Diagnosis not present

## 2015-12-14 DIAGNOSIS — N840 Polyp of corpus uteri: Secondary | ICD-10-CM | POA: Diagnosis not present

## 2015-12-19 LAB — PATHOLOGY

## 2015-12-20 LAB — PATHOLOGY

## 2016-02-24 ENCOUNTER — Other Ambulatory Visit: Payer: Self-pay | Admitting: Family Medicine

## 2016-02-24 DIAGNOSIS — Z87891 Personal history of nicotine dependence: Secondary | ICD-10-CM | POA: Insufficient documentation

## 2016-02-24 NOTE — Assessment & Plan Note (Signed)
Order chest CT for lung cancer screen starting at age 55

## 2016-02-24 NOTE — Progress Notes (Signed)
Ordered chest CT; just turned 55

## 2016-03-09 ENCOUNTER — Telehealth: Payer: Self-pay | Admitting: *Deleted

## 2016-03-09 NOTE — Telephone Encounter (Signed)
Received referral for initial lung cancer screening scan. Contacted patient and obtained smoking history. Patient request to wait until after holidays for screening scan. Will call in January.

## 2016-04-04 ENCOUNTER — Telehealth: Payer: Self-pay | Admitting: *Deleted

## 2016-04-04 NOTE — Telephone Encounter (Signed)
Received referral for initial lung cancer screening scan. Contacted patient as requested to arrange for scan in January. Patient request to call me back when she is ready for scan, after holidays are over. Given contact number.

## 2016-04-04 NOTE — Telephone Encounter (Signed)
error 

## 2016-06-11 ENCOUNTER — Encounter: Payer: Self-pay | Admitting: *Deleted

## 2016-10-12 ENCOUNTER — Telehealth: Payer: Self-pay | Admitting: *Deleted

## 2016-10-12 NOTE — Telephone Encounter (Signed)
Received referral for initial lung cancer screening scan. Contacted patient who refuses lung screening scan at this time.  

## 2016-10-22 DIAGNOSIS — M79671 Pain in right foot: Secondary | ICD-10-CM | POA: Diagnosis not present

## 2016-10-24 DIAGNOSIS — M2580 Other specified joint disorders, unspecified joint: Secondary | ICD-10-CM | POA: Diagnosis not present

## 2016-10-24 DIAGNOSIS — M79671 Pain in right foot: Secondary | ICD-10-CM | POA: Diagnosis not present

## 2016-10-24 DIAGNOSIS — S93601A Unspecified sprain of right foot, initial encounter: Secondary | ICD-10-CM | POA: Diagnosis not present

## 2016-11-07 DIAGNOSIS — M2580 Other specified joint disorders, unspecified joint: Secondary | ICD-10-CM | POA: Diagnosis not present

## 2017-05-14 ENCOUNTER — Ambulatory Visit (INDEPENDENT_AMBULATORY_CARE_PROVIDER_SITE_OTHER): Payer: BLUE CROSS/BLUE SHIELD | Admitting: Family Medicine

## 2017-05-14 ENCOUNTER — Encounter: Payer: Self-pay | Admitting: Family Medicine

## 2017-05-14 VITALS — BP 136/76 | HR 97 | Temp 98.2°F | Resp 14 | Ht 64.0 in | Wt 171.1 lb

## 2017-05-14 DIAGNOSIS — Z0000 Encounter for general adult medical examination without abnormal findings: Secondary | ICD-10-CM

## 2017-05-14 DIAGNOSIS — Z1239 Encounter for other screening for malignant neoplasm of breast: Secondary | ICD-10-CM

## 2017-05-14 DIAGNOSIS — Z1231 Encounter for screening mammogram for malignant neoplasm of breast: Secondary | ICD-10-CM

## 2017-05-14 MED ORDER — MECLIZINE HCL 25 MG PO TABS: 25.0000 mg | ORAL_TABLET | Freq: Three times a day (TID) | ORAL | 3 refills | Status: DC | PRN

## 2017-05-14 NOTE — Patient Instructions (Addendum)
Your next bone density will be due on or after November 24, 2017 Vitamin D3 1000 iu daily if not outdoors  Consider getting the new shingles vaccine called Shingrix; that is available for individuals 57 years of age and older, and is recommended even if you have had shingles in the past and/or already received the old shingles vaccine (Zostavax); it is a two-part series, and is available at many local pharmacies  Return for fasting labs at your convenience Health Maintenance, Female Adopting a healthy lifestyle and getting preventive care can go a long way to promote health and wellness. Talk with your health care provider about what schedule of regular examinations is right for you. This is a good chance for you to check in with your provider about disease prevention and staying healthy. In between checkups, there are plenty of things you can do on your own. Experts have done a lot of research about which lifestyle changes and preventive measures are most likely to keep you healthy. Ask your health care provider for more information. Weight and diet Eat a healthy diet  Be sure to include plenty of vegetables, fruits, low-fat dairy products, and lean protein.  Do not eat a lot of foods high in solid fats, added sugars, or salt.  Get regular exercise. This is one of the most important things you can do for your health. ? Most adults should exercise for at least 150 minutes each week. The exercise should increase your heart rate and make you sweat (moderate-intensity exercise). ? Most adults should also do strengthening exercises at least twice a week. This is in addition to the moderate-intensity exercise.  Maintain a healthy weight  Body mass index (BMI) is a measurement that can be used to identify possible weight problems. It estimates body fat based on height and weight. Your health care provider can help determine your BMI and help you achieve or maintain a healthy weight.  For females 49  years of age and older: ? A BMI below 18.5 is considered underweight. ? A BMI of 18.5 to 24.9 is normal. ? A BMI of 25 to 29.9 is considered overweight. ? A BMI of 30 and above is considered obese.  Watch levels of cholesterol and blood lipids  You should start having your blood tested for lipids and cholesterol at 57 years of age, then have this test every 5 years.  You may need to have your cholesterol levels checked more often if: ? Your lipid or cholesterol levels are high. ? You are older than 57 years of age. ? You are at high risk for heart disease.  Cancer screening Lung Cancer  Lung cancer screening is recommended for adults 45-52 years old who are at high risk for lung cancer because of a history of smoking.  A yearly low-dose CT scan of the lungs is recommended for people who: ? Currently smoke. ? Have quit within the past 15 years. ? Have at least a 30-pack-year history of smoking. A pack year is smoking an average of one pack of cigarettes a day for 1 year.  Yearly screening should continue until it has been 15 years since you quit.  Yearly screening should stop if you develop a health problem that would prevent you from having lung cancer treatment.  Breast Cancer  Practice breast self-awareness. This means understanding how your breasts normally appear and feel.  It also means doing regular breast self-exams. Let your health care provider know about any changes, no matter  how small.  If you are in your 20s or 30s, you should have a clinical breast exam (CBE) by a health care provider every 1-3 years as part of a regular health exam.  If you are 37 or older, have a CBE every year. Also consider having a breast X-ray (mammogram) every year.  If you have a family history of breast cancer, talk to your health care provider about genetic screening.  If you are at high risk for breast cancer, talk to your health care provider about having an MRI and a mammogram every  year.  Breast cancer gene (BRCA) assessment is recommended for women who have family members with BRCA-related cancers. BRCA-related cancers include: ? Breast. ? Ovarian. ? Tubal. ? Peritoneal cancers.  Results of the assessment will determine the need for genetic counseling and BRCA1 and BRCA2 testing.  Cervical Cancer Your health care provider may recommend that you be screened regularly for cancer of the pelvic organs (ovaries, uterus, and vagina). This screening involves a pelvic examination, including checking for microscopic changes to the surface of your cervix (Pap test). You may be encouraged to have this screening done every 3 years, beginning at age 19.  For women ages 40-65, health care providers may recommend pelvic exams and Pap testing every 3 years, or they may recommend the Pap and pelvic exam, combined with testing for human papilloma virus (HPV), every 5 years. Some types of HPV increase your risk of cervical cancer. Testing for HPV may also be done on women of any age with unclear Pap test results.  Other health care providers may not recommend any screening for nonpregnant women who are considered low risk for pelvic cancer and who do not have symptoms. Ask your health care provider if a screening pelvic exam is right for you.  If you have had past treatment for cervical cancer or a condition that could lead to cancer, you need Pap tests and screening for cancer for at least 20 years after your treatment. If Pap tests have been discontinued, your risk factors (such as having a new sexual partner) need to be reassessed to determine if screening should resume. Some women have medical problems that increase the chance of getting cervical cancer. In these cases, your health care provider may recommend more frequent screening and Pap tests.  Colorectal Cancer  This type of cancer can be detected and often prevented.  Routine colorectal cancer screening usually begins at 57  years of age and continues through 57 years of age.  Your health care provider may recommend screening at an earlier age if you have risk factors for colon cancer.  Your health care provider may also recommend using home test kits to check for hidden blood in the stool.  A small camera at the end of a tube can be used to examine your colon directly (sigmoidoscopy or colonoscopy). This is done to check for the earliest forms of colorectal cancer.  Routine screening usually begins at age 4.  Direct examination of the colon should be repeated every 5-10 years through 57 years of age. However, you may need to be screened more often if early forms of precancerous polyps or small growths are found.  Skin Cancer  Check your skin from head to toe regularly.  Tell your health care provider about any new moles or changes in moles, especially if there is a change in a mole's shape or color.  Also tell your health care provider if you have a  mole that is larger than the size of a pencil eraser.  Always use sunscreen. Apply sunscreen liberally and repeatedly throughout the day.  Protect yourself by wearing long sleeves, pants, a wide-brimmed hat, and sunglasses whenever you are outside.  Heart disease, diabetes, and high blood pressure  High blood pressure causes heart disease and increases the risk of stroke. High blood pressure is more likely to develop in: ? People who have blood pressure in the high end of the normal range (130-139/85-89 mm Hg). ? People who are overweight or obese. ? People who are African American.  If you are 48-13 years of age, have your blood pressure checked every 3-5 years. If you are 67 years of age or older, have your blood pressure checked every year. You should have your blood pressure measured twice-once when you are at a hospital or clinic, and once when you are not at a hospital or clinic. Record the average of the two measurements. To check your blood pressure  when you are not at a hospital or clinic, you can use: ? An automated blood pressure machine at a pharmacy. ? A home blood pressure monitor.  If you are between 44 years and 38 years old, ask your health care provider if you should take aspirin to prevent strokes.  Have regular diabetes screenings. This involves taking a blood sample to check your fasting blood sugar level. ? If you are at a normal weight and have a low risk for diabetes, have this test once every three years after 57 years of age. ? If you are overweight and have a high risk for diabetes, consider being tested at a younger age or more often. Preventing infection Hepatitis B  If you have a higher risk for hepatitis B, you should be screened for this virus. You are considered at high risk for hepatitis B if: ? You were born in a country where hepatitis B is common. Ask your health care provider which countries are considered high risk. ? Your parents were born in a high-risk country, and you have not been immunized against hepatitis B (hepatitis B vaccine). ? You have HIV or AIDS. ? You use needles to inject street drugs. ? You live with someone who has hepatitis B. ? You have had sex with someone who has hepatitis B. ? You get hemodialysis treatment. ? You take certain medicines for conditions, including cancer, organ transplantation, and autoimmune conditions.  Hepatitis C  Blood testing is recommended for: ? Everyone born from [birth year] through 1965. ? Anyone with known risk factors for hepatitis C.  Sexually transmitted infections (STIs)  You should be screened for sexually transmitted infections (STIs) including gonorrhea and chlamydia if: ? You are sexually active and are younger than 57 years of age. ? You are older than 57 years of age and your health care provider tells you that you are at risk for this type of infection. ? Your sexual activity has changed since you were last screened and you are at an increased  risk for chlamydia or gonorrhea. Ask your health care provider if you are at risk.  If you do not have HIV, but are at risk, it may be recommended that you take a prescription medicine daily to prevent HIV infection. This is called pre-exposure prophylaxis (PrEP). You are considered at risk if: ? You are sexually active and do not regularly use condoms or know the HIV status of your partner(s). ? You take drugs by injection. ? You  are sexually active with a partner who has HIV.  Talk with your health care provider about whether you are at high risk of being infected with HIV. If you choose to begin PrEP, you should first be tested for HIV. You should then be tested every 3 months for as long as you are taking PrEP. Pregnancy  If you are premenopausal and you may become pregnant, ask your health care provider about preconception counseling.  If you may become pregnant, take 400 to 800 micrograms (mcg) of folic acid every day.  If you want to prevent pregnancy, talk to your health care provider about birth control (contraception). Osteoporosis and menopause  Osteoporosis is a disease in which the bones lose minerals and strength with aging. This can result in serious bone fractures. Your risk for osteoporosis can be identified using a bone density scan.  If you are 19 years of age or older, or if you are at risk for osteoporosis and fractures, ask your health care provider if you should be screened.  Ask your health care provider whether you should take a calcium or vitamin D supplement to lower your risk for osteoporosis.  Menopause may have certain physical symptoms and risks.  Hormone replacement therapy may reduce some of these symptoms and risks. Talk to your health care provider about whether hormone replacement therapy is right for you. Follow these instructions at home:  Schedule regular health, dental, and eye exams.  Stay current with your immunizations.  Do not use any  tobacco products including cigarettes, chewing tobacco, or electronic cigarettes.  If you are pregnant, do not drink alcohol.  If you are breastfeeding, limit how much and how often you drink alcohol.  Limit alcohol intake to no more than 1 drink per day for nonpregnant women. One drink equals 12 ounces of beer, 5 ounces of wine, or 1 ounces of hard liquor.  Do not use street drugs.  Do not share needles.  Ask your health care provider for help if you need support or information about quitting drugs.  Tell your health care provider if you often feel depressed.  Tell your health care provider if you have ever been abused or do not feel safe at home. This information is not intended to replace advice given to you by your health care provider. Make sure you discuss any questions you have with your health care provider. Document Released: 10/09/2010 Document Revised: 09/01/2015 Document Reviewed: 12/28/2014 Elsevier Interactive Patient Education  [employer].

## 2017-05-14 NOTE — Progress Notes (Signed)
Patient ID: Tanya Howell, female   DOB: 28-Apr-1960, 57 y.o.   MRN: [id number]   Subjective:   Tanya Howell is a 57 y.o. female here for a complete physical exam  Interim issues since last visit: had the cervical polyp removed by Dr. Keturah Barre USPSTF grade A and B recommendations Depression:  Depression screen [facility] 2/9 05/14/2017 11/01/2015  Decreased Interest 0 0  Down, Depressed, Hopeless 0 0  PHQ - 2 Score 0 0   Hypertension:; stressful week at work, really good usually BP Readings from Last 3 Encounters:  05/14/17 136/76  12/13/15 133/78  11/30/15 114/70   Obesity: put some weight on Wt Readings from Last 3 Encounters:  05/14/17 171 lb 1.6 oz (77.6 kg)  12/13/15 158 lb (71.7 kg)  11/30/15 158 lb 14.4 oz (72.1 kg)   BMI Readings from Last 3 Encounters:  05/14/17 29.37 kg/m  12/13/15 26.70 kg/m  11/30/15 27.28 kg/m    Skin cancer: no worrisome moles Lung cancer:  Previous smoker, quit 8-9 years ago; started around [age] or so, less than 1 ppd x 30 years Breast cancer: no lumps; staff will order Colorectal cancer: due 2021; half-brother had colon cancer (maternal side, no one else on mother's side has it) Cervical cancer screening: last pap normal  BRCA gene screening: family hx of breast and/or ovarian cancer and/or metastatic prostate cancer? Mother had breast cancer; maternal aunt breast cancer; no ovarian cancer; no met prostate cancer; not interested in talking with geneticist HIV, hep B, hep C: not intersted STD testing and prevention (chl/gon/syphilis): not interested Intimate partner violence: no abuse Contraception: n/a Osteoporosis: next DEXA due August 2019 Fall prevention/vitamin D: discussed Immunizations: declined flu shot; discussed shingrix Diet: in between, good days and bad days Exercise: not as active in winter, better in summer Alcohol: no Tobacco use: quit AAA: n/a Aspirin: will check Glucose: forgot to fast, had food Glucose, Bld  Date  Value Ref Range Status  11/08/2015 99 65 - 99 mg/dL Final  11/01/2015 81 65 - 99 mg/dL Final   Lipids:  Lab Results  Component Value Date   CHOL 225 (H) 11/08/2015   CHOL 203 (H) 11/01/2015   Lab Results  Component Value Date   HDL 49 11/08/2015   HDL 51 11/01/2015   Lab Results  Component Value Date   LDLCALC 139 (H) 11/08/2015   LDLCALC 123 11/01/2015   Lab Results  Component Value Date   TRIG 186 (H) 11/08/2015   TRIG 146 11/01/2015   Lab Results  Component Value Date   CHOLHDL 4.6 11/08/2015   CHOLHDL 4.0 11/01/2015   No results found for: LDLDIRECT   Past Medical History:  Diagnosis Date  . Hyperlipidemia   . Osteopenia 11/01/2015  . Psoriasis    Past Surgical History:  Procedure Laterality Date  . CHOLECYSTECTOMY  1990  . TUBAL LIGATION     Family History  Problem Relation Age of Onset  . Colon cancer Brother        73   . Breast cancer Mother   . Breast cancer Maternal Aunt   . Pneumonia Father   . Diabetes Maternal Grandmother    Social History   Tobacco Use  . Smoking status: Former Smoker    Packs/day: 1.00    Years: 28.00    Pack years: 28.00    Types: Cigarettes  . Smokeless tobacco: Never Used  Substance Use Topics  . Alcohol use: No    Alcohol/week: 0.0 oz  .  Drug use: No   Review of Systems  Objective:   Vitals:   05/14/17 1415  BP: 136/76  Pulse: 97  Resp: 14  Temp: 98.2 F (36.8 C)  TempSrc: Oral  SpO2: 95%  Weight: 171 lb 1.6 oz (77.6 kg)  Height: '5\' 4"'  (1.626 m)   Body mass index is 29.37 kg/m. Wt Readings from Last 3 Encounters:  05/14/17 171 lb 1.6 oz (77.6 kg)  12/13/15 158 lb (71.7 kg)  11/30/15 158 lb 14.4 oz (72.1 kg)   Physical Exam  Constitutional: She appears well-developed and well-nourished.  HENT:  Head: Normocephalic and atraumatic.  Right Ear: Hearing, tympanic membrane, external ear and ear canal normal.  Left Ear: Hearing, tympanic membrane, external ear and ear canal normal.  Eyes:  Conjunctivae and EOM are normal. Right eye exhibits no hordeolum. Left eye exhibits no hordeolum. No scleral icterus.  Neck: Carotid bruit is not present. No thyromegaly present.  Cardiovascular: Normal rate, regular rhythm, S1 normal, S2 normal and normal heart sounds.  No extrasystoles are present.  Pulmonary/Chest: Effort normal and breath sounds normal. No respiratory distress. Right breast exhibits no inverted nipple, no mass, no nipple discharge, no skin change and no tenderness. Left breast exhibits no inverted nipple, no mass, no nipple discharge, no skin change and no tenderness. Breasts are symmetrical.  Abdominal: Soft. Normal appearance and bowel sounds are normal. She exhibits no distension, no abdominal bruit, no pulsatile midline mass and no mass. There is no hepatosplenomegaly. There is no tenderness. No hernia.  Genitourinary: Cervix exhibits no discharge and no friability. No vaginal discharge found.  Musculoskeletal: Normal range of motion. She exhibits no edema.  Lymphadenopathy:       Head (right side): No submandibular adenopathy present.       Head (left side): No submandibular adenopathy present.    She has no cervical adenopathy.    She has no axillary adenopathy.  Neurological: She is alert. She displays no tremor. No cranial nerve deficit. She exhibits normal muscle tone. Gait normal.  Reflex Scores:      Patellar reflexes are 2+ on the right side and 2+ on the left side. Skin: Skin is warm and dry. No bruising and no ecchymosis noted. No cyanosis. No pallor.  Psychiatric: Her speech is normal and behavior is normal. Thought content normal. Her mood appears not anxious. She does not exhibit a depressed mood.    Assessment/Plan:   Problem List Items Addressed This Visit      Other   Preventative health care - Primary    USPSTF grade A and B recommendations reviewed with patient; age-appropriate recommendations, preventive care, screening tests, etc discussed and  encouraged; healthy living encouraged; see AVS for patient education given to patient       Relevant Orders   CBC with Differential/Platelet   COMPLETE METABOLIC PANEL WITH GFR   Lipid panel   TSH    Other Visit Diagnoses    Screening for breast cancer       Relevant Orders   MM Digital Screening       Meds ordered this encounter  Medications  . meclizine (ANTIVERT) 25 MG tablet    Sig: Take 1 tablet (25 mg total) by mouth 3 (three) times daily as needed for dizziness.    Dispense:  30 tablet    Refill:  3   Orders Placed This Encounter  Procedures  . MM Digital Screening    Standing Status:   Future  Standing Expiration Date:   07/13/2018    Order Specific Question:   Reason for Exam (SYMPTOM  OR DIAGNOSIS REQUIRED)    Answer:   screening for breast cancer    Order Specific Question:   Is the patient pregnant?    Answer:   No    Order Specific Question:   Preferred imaging location?    Answer:   [facility]  . CBC with Differential/Platelet  . COMPLETE METABOLIC PANEL WITH GFR  . Lipid panel  . TSH    Follow up plan: Return in about 1 year (around 05/14/2018) for complete physical.  An After Visit Summary was printed and given to the patient.

## 2017-05-14 NOTE — Assessment & Plan Note (Signed)
USPSTF grade A and B recommendations reviewed with patient; age-appropriate recommendations, preventive care, screening tests, etc discussed and encouraged; healthy living encouraged; see AVS for patient education given to patient  

## 2017-05-15 DIAGNOSIS — Z0000 Encounter for general adult medical examination without abnormal findings: Secondary | ICD-10-CM | POA: Diagnosis not present

## 2017-05-15 LAB — COMPLETE METABOLIC PANEL WITH GFR
AG Ratio: 1.8 (calc) (ref 1.0–2.5)
ALT: 20 U/L (ref 6–29)
AST: 15 U/L (ref 10–35)
Albumin: 4.6 g/dL (ref 3.6–5.1)
Alkaline phosphatase (APISO): 60 U/L (ref 33–130)
BUN: 13 mg/dL (ref 7–25)
CALCIUM: 9.9 mg/dL (ref 8.6–10.4)
CO2: 26 mmol/L (ref 20–32)
CREATININE: 0.87 mg/dL (ref 0.50–1.05)
Chloride: 107 mmol/L (ref 98–110)
GFR, EST AFRICAN AMERICAN: 86 mL/min/{1.73_m2} (ref >=60)
GFR, EST NON AFRICAN AMERICAN: 74 mL/min/{1.73_m2} (ref >=60)
GLUCOSE: 99 mg/dL (ref 65–99)
Globulin: 2.5 g/dL (calc) (ref 1.9–3.7)
Potassium: 4.2 mmol/L (ref 3.5–5.3)
Sodium: 141 mmol/L (ref 135–146)
TOTAL PROTEIN: 7.1 g/dL (ref 6.1–8.1)
Total Bilirubin: 0.4 mg/dL (ref 0.2–1.2)

## 2017-05-15 LAB — CBC WITH DIFFERENTIAL/PLATELET
BASOS PCT: 1.3 %
Basophils Absolute: 98 cells/uL (ref 0–200)
Eosinophils Absolute: 285 cells/uL (ref 15–500)
Eosinophils Relative: 3.8 %
HCT: 40.8 % (ref 35.0–45.0)
Hemoglobin: 14.1 g/dL (ref 11.7–15.5)
Lymphs Abs: 2355 cells/uL (ref 850–3900)
MCH: 28.7 pg (ref 27.0–33.0)
MCHC: 34.6 g/dL (ref 32.0–36.0)
MCV: 82.9 fL (ref 80.0–100.0)
MPV: 10.9 fL (ref 7.5–12.5)
Monocytes Relative: 6.9 %
Neutro Abs: 4245 cells/uL (ref 1500–7800)
Neutrophils Relative %: 56.6 %
PLATELETS: 317 10*3/uL (ref 140–400)
RBC: 4.92 10*6/uL (ref 3.80–5.10)
RDW: 12.8 % (ref 11.0–15.0)
TOTAL LYMPHOCYTE: 31.4 %
WBC: 7.5 10*3/uL (ref 3.8–10.8)
WBCMIX: 518 {cells}/uL (ref 200–950)

## 2017-05-15 LAB — LIPID PANEL
CHOL/HDL RATIO: 4.8 (calc) (ref <5.0)
Cholesterol: 249 mg/dL — ABNORMAL HIGH (ref <200)
HDL: 52 mg/dL (ref >50)
LDL CHOLESTEROL (CALC): 163 mg/dL — AB
NON-HDL CHOLESTEROL (CALC): 197 mg/dL — AB (ref <130)
TRIGLYCERIDES: 184 mg/dL — AB (ref <150)

## 2017-05-15 LAB — TSH: TSH: 1.4 m[IU]/L (ref 0.40–4.50)

## 2017-09-20 DIAGNOSIS — J209 Acute bronchitis, unspecified: Secondary | ICD-10-CM | POA: Diagnosis not present

## 2017-10-18 ENCOUNTER — Other Ambulatory Visit: Payer: Self-pay | Admitting: Family Medicine

## 2017-10-18 ENCOUNTER — Ambulatory Visit
Admission: RE | Admit: 2017-10-18 | Discharge: 2017-10-18 | Disposition: A | Discharge status: Home / Self Care | Payer: BLUE CROSS/BLUE SHIELD | Source: Ambulatory Visit | Attending: Family Medicine | Admitting: Family Medicine

## 2017-10-18 DIAGNOSIS — Z1239 Encounter for other screening for malignant neoplasm of breast: Secondary | ICD-10-CM

## 2017-10-18 DIAGNOSIS — Z1231 Encounter for screening mammogram for malignant neoplasm of breast: Secondary | ICD-10-CM | POA: Insufficient documentation

## 2017-10-21 ENCOUNTER — Other Ambulatory Visit: Payer: Self-pay | Admitting: Family Medicine

## 2017-10-21 DIAGNOSIS — N6310 Unspecified lump in the right breast, unspecified quadrant: Secondary | ICD-10-CM

## 2017-10-21 DIAGNOSIS — R928 Other abnormal and inconclusive findings on diagnostic imaging of breast: Secondary | ICD-10-CM

## 2017-10-24 ENCOUNTER — Ambulatory Visit
Admission: RE | Admit: 2017-10-24 | Discharge: 2017-10-24 | Disposition: A | Discharge status: Home / Self Care | Payer: BLUE CROSS/BLUE SHIELD | Source: Ambulatory Visit | Attending: Family Medicine | Admitting: Family Medicine

## 2017-10-24 DIAGNOSIS — N6310 Unspecified lump in the right breast, unspecified quadrant: Secondary | ICD-10-CM | POA: Diagnosis not present

## 2017-10-24 DIAGNOSIS — R922 Inconclusive mammogram: Secondary | ICD-10-CM | POA: Diagnosis not present

## 2017-10-24 DIAGNOSIS — R928 Other abnormal and inconclusive findings on diagnostic imaging of breast: Secondary | ICD-10-CM | POA: Diagnosis not present

## 2017-10-24 DIAGNOSIS — N6312 Unspecified lump in the right breast, upper inner quadrant: Secondary | ICD-10-CM | POA: Diagnosis not present

## 2017-10-29 ENCOUNTER — Telehealth: Payer: Self-pay | Admitting: Family Medicine

## 2017-10-29 DIAGNOSIS — M8580 Other specified disorders of bone density and structure, unspecified site: Secondary | ICD-10-CM

## 2017-10-29 NOTE — Telephone Encounter (Signed)
Please let pt know how to order her DEXA scan I've ordered it; due after November 24, 2017

## 2017-10-29 NOTE — Assessment & Plan Note (Signed)
Due for DEXA in August; order

## 2017-10-30 NOTE — Telephone Encounter (Signed)
Pt.notified

## 2018-04-02 ENCOUNTER — Telehealth: Payer: Self-pay | Admitting: Family Medicine

## 2018-04-02 DIAGNOSIS — R928 Other abnormal and inconclusive findings on diagnostic imaging of breast: Secondary | ICD-10-CM

## 2018-04-02 NOTE — Telephone Encounter (Signed)
Please let patient know that she will be due for repeat breast imaging on or after April 26, 2018

## 2018-04-02 NOTE — Telephone Encounter (Signed)
-----   Message from Arnetha Courser, MD sent at 10/26/2017  5:01 PM EDT ----- Regarding: Follow-up breast imaging due January 2020 RECOMMENDATION: Six-month follow-up right breast ultrasound is recommended.

## 2018-04-03 NOTE — Telephone Encounter (Signed)
Pt.notified

## 2018-04-08 ENCOUNTER — Other Ambulatory Visit: Payer: BLUE CROSS/BLUE SHIELD

## 2018-04-28 ENCOUNTER — Other Ambulatory Visit: Payer: BLUE CROSS/BLUE SHIELD

## 2018-05-06 ENCOUNTER — Other Ambulatory Visit: Payer: BLUE CROSS/BLUE SHIELD

## 2018-05-06 ENCOUNTER — Ambulatory Visit: Payer: BLUE CROSS/BLUE SHIELD | Admitting: Nurse Practitioner

## 2018-05-06 ENCOUNTER — Encounter: Payer: Self-pay | Admitting: Nurse Practitioner

## 2018-05-06 VITALS — BP 128/82 | HR 96 | Temp 98.5°F | Resp 16 | Wt 170.9 lb

## 2018-05-06 DIAGNOSIS — R05 Cough: Secondary | ICD-10-CM | POA: Diagnosis not present

## 2018-05-06 DIAGNOSIS — J029 Acute pharyngitis, unspecified: Secondary | ICD-10-CM | POA: Diagnosis not present

## 2018-05-06 DIAGNOSIS — R5383 Other fatigue: Secondary | ICD-10-CM | POA: Diagnosis not present

## 2018-05-06 DIAGNOSIS — J069 Acute upper respiratory infection, unspecified: Secondary | ICD-10-CM

## 2018-05-06 DIAGNOSIS — R059 Cough, unspecified: Secondary | ICD-10-CM

## 2018-05-06 LAB — POCT INFLUENZA A/B
INFLUENZA A, POC: NEGATIVE
Influenza B, POC: NEGATIVE

## 2018-05-06 MED ORDER — PROMETHAZINE-DM 6.25-15 MG/5ML PO SYRP: 2.5000 mL | ORAL_SOLUTION | Freq: Four times a day (QID) | ORAL | 0 refills | Status: DC | PRN

## 2018-05-06 NOTE — Progress Notes (Signed)
Name: Tanya Howell MRN: [id number] DOB: [birth year] Date:05/06/2018 Progress Note Subjective Chief Complaint Chief Complaint  Patient presents with  . Fatigue    started yesterday  . Sore Throat    left side predominantly - that started on Sunday. she stated that it is mostly gone now   HPI  Patient presents to the clinic with a dry, hacking nonproductive cough since Sunday. Denies fever or chills. Endorses a sore throat, however it has improved some.Denies chest pain. Recently in contact with someone with known influenza virus. Afraid she might have flu due to this. Complains of fatigue that started yesterday and feeling more tired than usual when completing normal daily tasks. Did not receive annual flu vaccine.    Patient Active Problem List   Diagnosis Date Noted  . Hx of tobacco use, presenting hazards to health 02/24/2016  . Psoriasis 12/03/2015  . Overweight (BMI 25.0-29.9) 12/03/2015  . Vertigo 12/03/2015  . 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 Preventative health care 11/01/2015  . Breast cancer screening 11/01/2015  . Osteopenia 11/01/2015  . Pap smear for cervical cancer screening 11/01/2015  . Abnormal appearance of cervix 11/01/2015   Past Medical History:  Diagnosis Date  . Hyperlipidemia   . Osteopenia 11/01/2015  . Psoriasis    Past Surgical History:  Procedure Laterality Date  . CHOLECYSTECTOMY  1990  . TUBAL LIGATION     Social History   Tobacco Use  . Smoking status: Former Smoker    Packs/day: 1.00    Years: 28.00    Pack years: 28.00    Types: Cigarettes  . Smokeless tobacco: Never Used  Substance Use Topics  . Alcohol use: No    Alcohol/week: 0.0 standard drinks    Current Outpatient Medications:  .  clobetasol ointment (TEMOVATE) 9.67 %, Apply 1 application topically 2 (two) times daily as needed., Disp: 30 g, Rfl: 3 .  meclizine (ANTIVERT) 25 MG tablet, Take 1 tablet (25 mg total) by mouth 3 (three) times daily as needed for dizziness.,  Disp: 30 tablet, Rfl: 3 .  promethazine-dextromethorphan (PROMETHAZINE-DM) 6.25-15 MG/5ML syrup, Take 2.5 mLs by mouth 4 (four) times daily as needed., Disp: 118 mL, Rfl: 0 No Known Allergies   Review of Systems  HENT: Negative for sinus pain.   Respiratory: Negative for shortness of breath and wheezing.   See HPI for other related symptoms  No other specific complaints in a complete review of systems (except as listed in HPI above). Objective Vitals:   05/06/18 0915  BP: 128/82  Pulse: 96  Resp: 16  Temp: 98.5 F (36.9 C)  TempSrc: Oral  SpO2: 97%  Weight: 170 lb 14.4 oz (77.5 kg)    Body mass index is 29.33 kg/m. Nursing Note and Vital Signs reviewed. Physical Exam HENT:     Head: Normocephalic.     Right Ear: Tympanic membrane normal.     Left Ear: Tympanic membrane normal.     Nose: Congestion present.     Mouth/Throat:     Mouth: Mucous membranes are moist.     Pharynx: Uvula midline. No pharyngeal swelling or posterior oropharyngeal erythema.     Tonsils: No tonsillar exudate. Swelling: 0 on the right. 0 on the left.  Cardiovascular:     Rate and Rhythm: Normal rate and regular rhythm.     Heart sounds: Normal heart sounds.  Pulmonary:     Effort: Pulmonary effort is normal.     Breath sounds: Normal breath sounds.  Lymphadenopathy:  Head:     Right side of head: No submental, tonsillar or preauricular adenopathy.     Left side of head: No submental, tonsillar or preauricular adenopathy.     Cervical: No cervical adenopathy.  Neurological:     Mental Status: She is alert.     ? Results for orders placed or performed in visit on 05/06/18 (from the past 48 hour(s))  POCT Influenza A/B     Status: Normal   Collection Time: 05/06/18  9:54 AM  Result Value Ref Range   Influenza A, POC Negative Negative   Influenza B, POC Negative Negative   Assessment & Plan  1. Fatigue, unspecified type Provided education about getting rest and drinking fluids. If  fatigue does not improve or accompanied with other symptoms, such as fever or chest pain then it is important to follow up at the clinic.   - POCT Influenza A/B  2. Sore throat Sore throat is currently improving per patient. Important to continue drinking fluids and getting rest. If sore throat worsen, then follow-up may be necessary.  - POCT Influenza A/B  3. Cough Non-productive cough present. Cough should improve within 1 week. Promethazine-DM prescribed for cough as needed. - POCT Influenza A/B - promethazine-dextromethorphan (PROMETHAZINE-DM) 6.25-15 MG/5ML syrup; Take 2.5 mLs by mouth 4 (four) times daily as needed.  Dispense: 118 mL; Refill: 0  4. Upper respiratory tract infection, unspecified type Negative for flu; likely viral URI. Three day course of cough and sore throat. POCT Influenza A/B negative. Education provided about proper fluids and rest. Negative fever, sinus tenderness, or lymphadenopathy. No concerns for bacterial infection given patient presentation. Education provided about symptoms that would indicate a bacterial infection, such as fever or chest pain.    -Red flags and when to present for emergency care or RTC including fever >101.67F, chest pain, shortness of breath, new/worsening/un-resolving symptoms, reviewed with patient at time of visit. Follow up and care instructions discussed and provided in AVS.

## 2018-05-06 NOTE — Patient Instructions (Signed)
You likely have a viral upper respiratory infection (URI). Antibiotics will not reduce the number of days you are ill or prevent you from getting bacterial rhinosinusitis. A URI can take up to 14 days to resolve, but typically last between 7-11 days. Your body is so smart and strong that it will be fighting this illness off for you but it is important that you drink plenty of fluids, rest. Cover your nose/mouth when you cough or sneeze and wash your hands well and often. Here are some helpful things you can use or pick up over the counter from the pharmacy to help with your symptoms:   For Fever/Pain: Acetaminophen every 6 hours as needed (maximum of 3000mg  a day). If you are still uncomfortable you can add ibuprofen OR naproxen  For coughing: try dextromethorphan for a cough suppressant, and/or a cool mist humidifier, lozenges  For sore throat: saline gargles, honey herbal tea, lozenges, throat spray  To dry out your nose: try an antihistamine like loratadine (non-sedating) or diphenhydramine (sedating) or others To relieve a stuffy nose: try an oral decongestant  Like pseudoephedrine if you are under the age of 66 and do not have high blood pressure, neti pot To make blowing your nose easier and relieve chest congestion: guaifenesin 400mg  every 4-6 hours of guaifenesin ER [phone] mg every 12 hours. Do not take more than 2,400mg  a day.

## 2018-05-14 ENCOUNTER — Ambulatory Visit
Admission: RE | Admit: 2018-05-14 | Discharge: 2018-05-14 | Disposition: A | Discharge status: Home / Self Care | Payer: BLUE CROSS/BLUE SHIELD | Source: Ambulatory Visit | Attending: Family Medicine | Admitting: Family Medicine

## 2018-05-14 DIAGNOSIS — R928 Other abnormal and inconclusive findings on diagnostic imaging of breast: Secondary | ICD-10-CM | POA: Diagnosis not present

## 2018-05-14 DIAGNOSIS — N6011 Diffuse cystic mastopathy of right breast: Secondary | ICD-10-CM | POA: Diagnosis not present

## 2018-05-15 ENCOUNTER — Other Ambulatory Visit: Payer: Self-pay | Admitting: Family Medicine

## 2018-05-15 DIAGNOSIS — N6310 Unspecified lump in the right breast, unspecified quadrant: Secondary | ICD-10-CM

## 2018-05-15 DIAGNOSIS — Z1231 Encounter for screening mammogram for malignant neoplasm of breast: Secondary | ICD-10-CM

## 2018-05-16 ENCOUNTER — Encounter: Payer: BLUE CROSS/BLUE SHIELD | Admitting: Family Medicine

## 2018-11-04 DIAGNOSIS — Z23 Encounter for immunization: Secondary | ICD-10-CM | POA: Diagnosis not present

## 2018-11-04 DIAGNOSIS — S61012A Laceration without foreign body of left thumb without damage to nail, initial encounter: Secondary | ICD-10-CM | POA: Diagnosis not present

## 2018-11-06 ENCOUNTER — Encounter: Payer: Self-pay | Admitting: General Surgery

## 2019-05-07 DIAGNOSIS — Z20828 Contact with and (suspected) exposure to other viral communicable diseases: Secondary | ICD-10-CM | POA: Diagnosis not present

## 2019-06-16 ENCOUNTER — Telehealth: Payer: Self-pay

## 2019-06-16 NOTE — Telephone Encounter (Signed)
The patient is due for her 5 year Colonoscopy exam. Message left for the patient to see where she would like to be referred in order to have her Colonoscopy as Dr Bary Castilla is no longer with this office.

## 2019-08-19 IMAGING — MG MM DIGITAL SCREENING BILAT W/ TOMO W/ CAD
6 of 10 series · 6 of 30 positions shown · non-contrast
Comparison: Previous exam(s).

CLINICAL DATA: Screening.

EXAM:
DIGITAL SCREENING BILATERAL MAMMOGRAM WITH TOMO AND CAD

[L CC synth-2D]
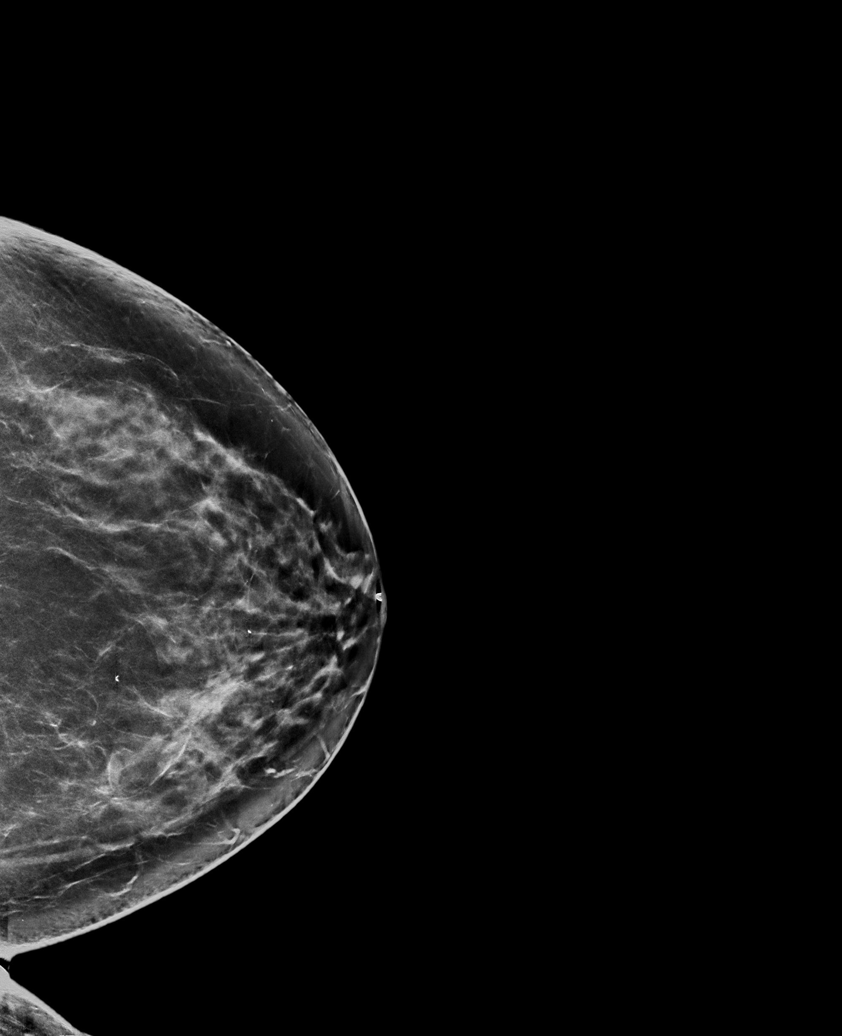

[L MLO synth-2D]
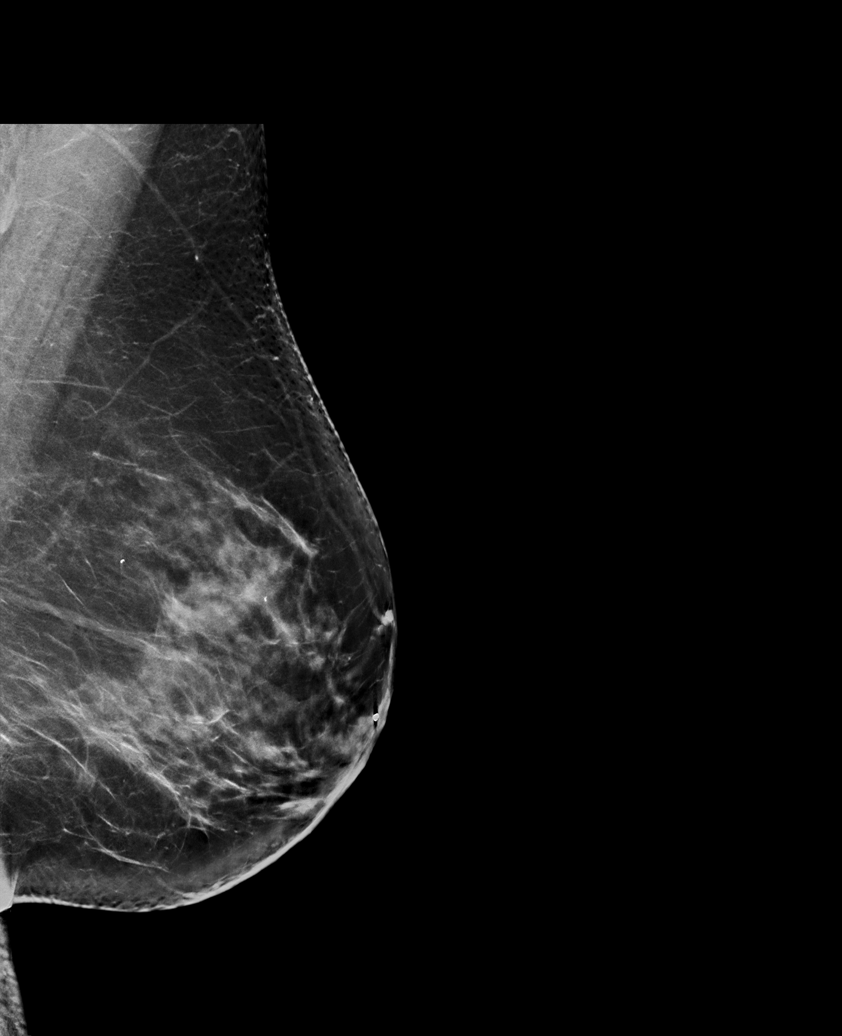

[R MLO synth-2D (1 of 2)]
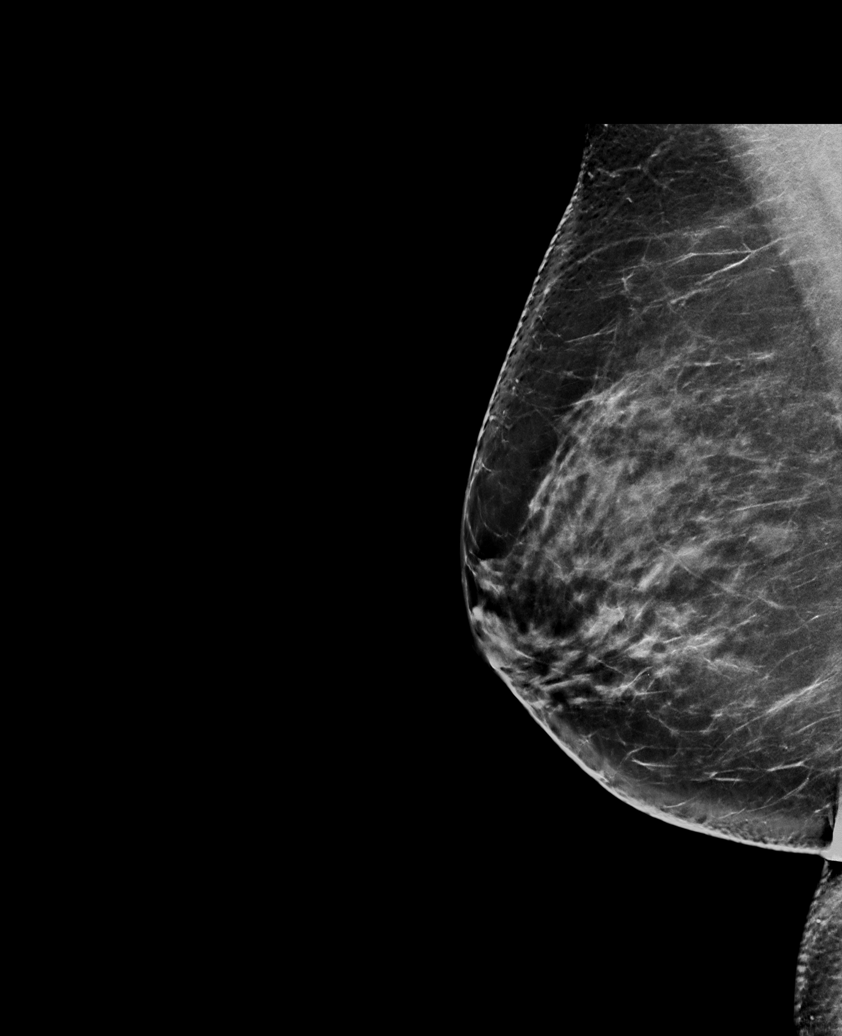

[R CC synth-2D]
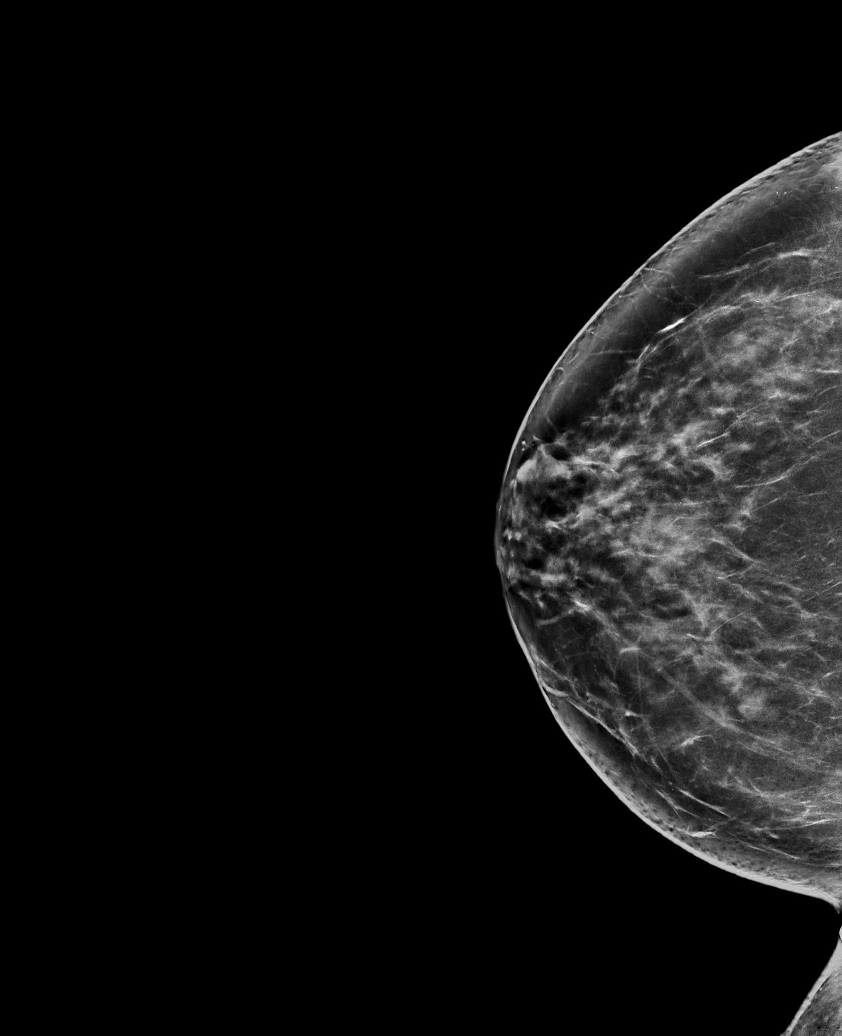

[R MLO synth-2D (2 of 2)]
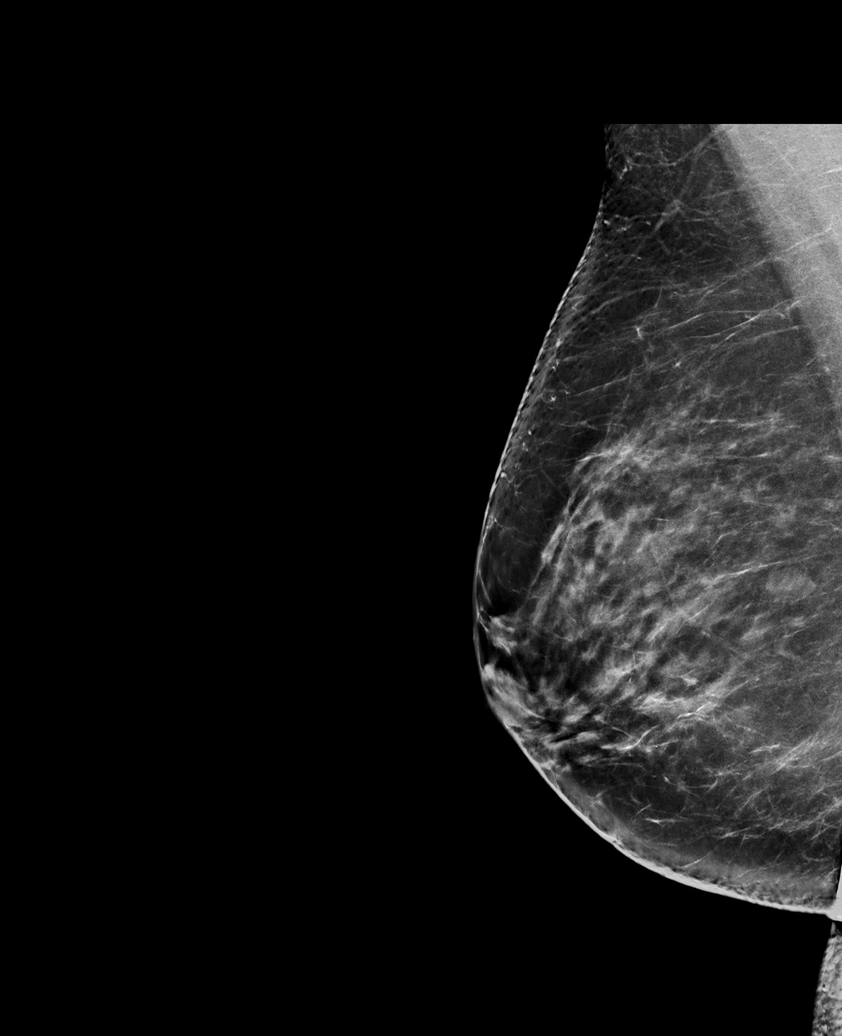

[R MLO tomo · tomo slice 43/86.0]
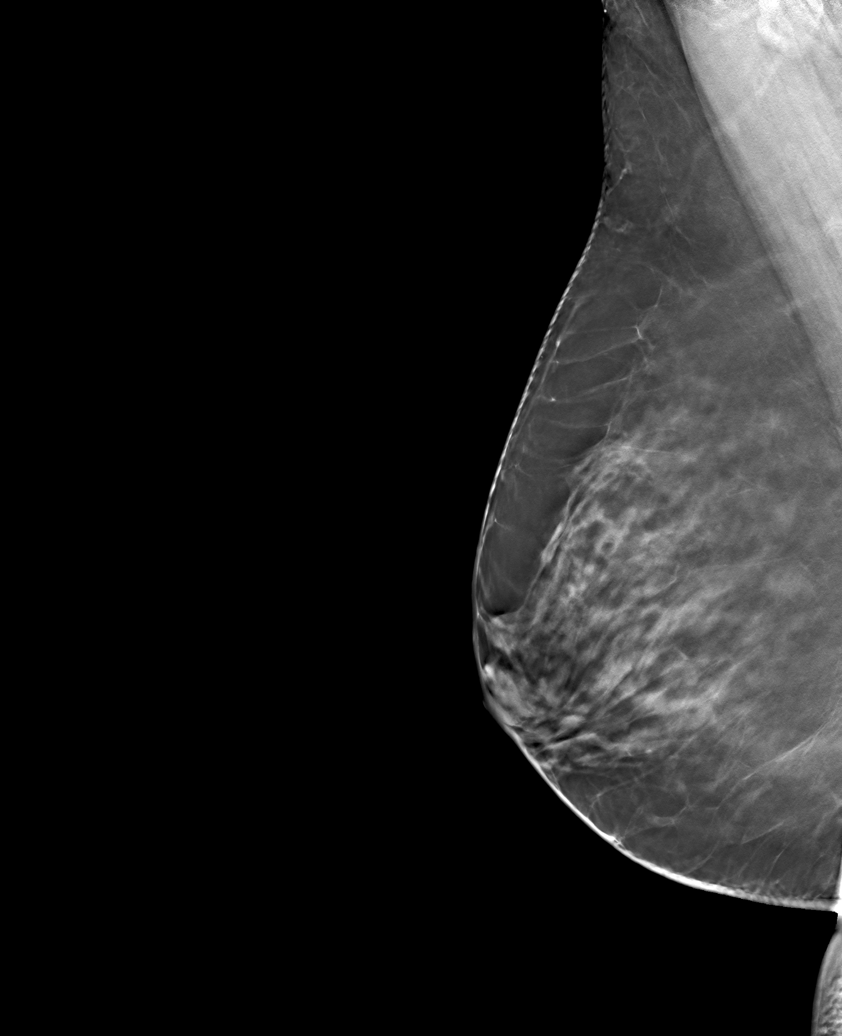

[6 of 30 positions shown; findings below may reference images not displayed]

ACR Breast Density Category c: The breast tissue is heterogeneously
dense, which may obscure small masses.
FINDINGS: In the right breast, a possible mass warrants further evaluation.
This possible mass is seen within the inner RIGHT breast, at
posterior depth, tomosynthesis CC slice 44 and MLO slice 58.

In the left breast, no findings suspicious for malignancy. Images
were processed with CAD.
IMPRESSION: Further evaluation is suggested for possible mass in the right
breast.

RECOMMENDATION:
Diagnostic mammogram and possibly ultrasound of the right breast.
(Code:UT-4-NN1)

The patient will be contacted regarding the findings, and additional
imaging will be scheduled.

BI-RADS CATEGORY  0: Incomplete. Need additional imaging evaluation
and/or prior mammograms for comparison.

## 2019-11-10 ENCOUNTER — Ambulatory Visit (INDEPENDENT_AMBULATORY_CARE_PROVIDER_SITE_OTHER): Payer: BC Managed Care – PPO | Admitting: Family Medicine

## 2019-11-10 ENCOUNTER — Other Ambulatory Visit (HOSPITAL_COMMUNITY)
Admission: RE | Admit: 2019-11-10 | Discharge: 2019-11-10 | Disposition: A | Discharge status: Home / Self Care | Payer: BC Managed Care – PPO | Source: Ambulatory Visit | Attending: Family Medicine | Admitting: Family Medicine

## 2019-11-10 ENCOUNTER — Other Ambulatory Visit: Payer: Self-pay

## 2019-11-10 ENCOUNTER — Encounter: Payer: Self-pay | Admitting: Family Medicine

## 2019-11-10 VITALS — BP 130/78 | HR 94 | Temp 97.5°F | Resp 16 | Ht 64.0 in | Wt 169.1 lb

## 2019-11-10 DIAGNOSIS — Z124 Encounter for screening for malignant neoplasm of cervix: Secondary | ICD-10-CM | POA: Diagnosis not present

## 2019-11-10 DIAGNOSIS — Z0000 Encounter for general adult medical examination without abnormal findings: Secondary | ICD-10-CM | POA: Insufficient documentation

## 2019-11-10 DIAGNOSIS — E785 Hyperlipidemia, unspecified: Secondary | ICD-10-CM

## 2019-11-10 DIAGNOSIS — Z1159 Encounter for screening for other viral diseases: Secondary | ICD-10-CM | POA: Diagnosis not present

## 2019-11-10 DIAGNOSIS — Z780 Asymptomatic menopausal state: Secondary | ICD-10-CM

## 2019-11-10 DIAGNOSIS — R928 Other abnormal and inconclusive findings on diagnostic imaging of breast: Secondary | ICD-10-CM | POA: Diagnosis not present

## 2019-11-10 DIAGNOSIS — Z1231 Encounter for screening mammogram for malignant neoplasm of breast: Secondary | ICD-10-CM

## 2019-11-10 NOTE — Progress Notes (Signed)
  Patient: Tanya Howell, Female    DOB: 12/18/1960, 58 y.o.   MRN: [id number] Lada, Melinda P, MD Visit Date: 11/10/2019  Today's Provider: Leisa Tapia, PA-C   Chief Complaint  Patient presents with  . Annual Exam    with pap   Subjective:  Pt new to me, last CPE was 2019 with prior PCP Dr. Lada who left the practice last year, only seen once since for acute illness, here for CPE today  Annual physical exam:  Tanya Howell is a 58 y.o. female who presents today for complete physical exam:  Exercise/Activity:  Working from home sitting a lot more -  Diet/nutrition:  No particular diet  Sleep:  Not the best sleeper   The 10-year ASCVD risk score (Goff DC Jr., et al., 2013) is: 3.5%   Values used to calculate the score:     Age: 58 years     Sex: Female     Is Non-Hispanic African American: No     Diabetic: No     Tobacco smoker: No     Systolic Blood Pressure: 130 mmHg     Is BP treated: No     HDL Cholesterol: 52 mg/dL     Total Cholesterol: 249 mg/dL   Hyperlipidemia Currently treated - not on meds Took statins in the past with Dr. Lada - LDL very high in the past Last Lipids: Lab Results  Component Value Date   CHOL 249 (H) 05/15/2017   HDL 52 05/15/2017   LDLCALC 163 (H) 05/15/2017   TRIG 184 (H) 05/15/2017   CHOLHDL 4.8 05/15/2017   - Denies: Chest pain, shortness of breath, myalgias, claudication   Advised pt of separate visit billing/coding  USPSTF grade A and B recommendations - reviewed and addressed today  Depression:  Phq 9 completed today by patient, was reviewed by me with patient in the room PHQ score is neg, pt feels good, has some anxiety PHQ 2/9 Scores 05/06/2018 05/14/2017 11/01/2015  PHQ - 2 Score 0 0 0   Depression screen PHQ 2/9 05/06/2018 05/14/2017 11/01/2015  Decreased Interest 0 0 0  Down, Depressed, Hopeless 0 0 0  PHQ - 2 Score 0 0 0    Alcohol screening:   Office Visit from 05/06/2018 in [facility]    AUDIT-C Score 0      Immunizations and Health Maintenance: Health Maintenance  Topic Date Due  . Hepatitis C Screening  Never done  . COVID-19 Vaccine (1) Never done  . HIV Screening  Never done  . TETANUS/TDAP  Never done  . MAMMOGRAM  10/19/2018  . PAP SMEAR-Modifier  11/01/2018  . COLONOSCOPY  07/07/2019  . INFLUENZA VACCINE  11/08/2019     Hep C Screening: due  STD testing and prevention (HIV/chl/gon/syphilis):  see above, no additional testing desired by pt today  Intimate partner violence:  Safe   Sexual History/Pain during Intercourse: Married  Menstrual History/LMP/Abnormal Bleeding:  Menopause  No LMP recorded. Patient is postmenopausal.  Incontinence Symptoms: denies  Breast cancer:  Overdue for close f/up from last mammogram RECOMMENDATION: Short-term interval follow-up bilateral mammogram and right breast ultrasound in July of 2020 is recommended. Last Mammogram: *see HM list above BRCA gene screening: no known past testing  Cervical cancer screening: due today   Osteoporosis:   Discussion on osteoporosis per age, including high calcium and vitamin D supplementation, weight bearing exercises  Skin cancer:  Hx of skin CA -  NO     Discussed atypical lesions   Colorectal cancer:   Colonoscopy is due - 5 year repeat went to [facility] Discussed concerning signs and sx of CRC, pt denies melena, hematochezia no bowel changes   Lung cancer:   Low Dose CT Chest recommended if Age 55-80 years, 30 pack-year currently smoking OR have quit w/in 15years. Patient does qualify.    Quit in 2011?  Social History   Tobacco Use  . Smoking status: Former Smoker    Packs/day: 1.00    Years: 28.00    Pack years: 28.00    Types: Cigarettes  . Smokeless tobacco: Never Used  Vaping Use  . Vaping Use: Former  Substance Use Topics  . Alcohol use: No    Alcohol/week: 0.0 standard drinks  . Drug use: No       Office Visit from 05/06/2018 in [facility]  AUDIT-C Score 0      Family History  Problem Relation Age of Onset  . Colon cancer Brother        68   . Breast cancer Mother   . Breast cancer Maternal Aunt   . Pneumonia Father   . Diabetes Maternal Grandmother      Blood pressure/Hypertension: BP Readings from Last 3 Encounters:  11/10/19 130/78  05/06/18 128/82  05/14/17 136/76    Weight/Obesity: Wt Readings from Last 3 Encounters:  05/06/18 170 lb 14.4 oz (77.5 kg)  05/14/17 171 lb 1.6 oz (77.6 kg)  12/13/15 158 lb (71.7 kg)   BMI Readings from Last 3 Encounters:  05/06/18 29.33 kg/m  05/14/17 29.37 kg/m  12/13/15 26.70 kg/m     Lipids:  Lab Results  Component Value Date   CHOL 249 (H) 05/15/2017   CHOL 225 (H) 11/08/2015   CHOL 203 (H) 11/01/2015   Lab Results  Component Value Date   HDL 52 05/15/2017   HDL 49 11/08/2015   HDL 51 11/01/2015   Lab Results  Component Value Date   LDLCALC 163 (H) 05/15/2017   LDLCALC 139 (H) 11/08/2015   LDLCALC 123 11/01/2015   Lab Results  Component Value Date   TRIG 184 (H) 05/15/2017   TRIG 186 (H) 11/08/2015   TRIG 146 11/01/2015   Lab Results  Component Value Date   CHOLHDL 4.8 05/15/2017   CHOLHDL 4.6 11/08/2015   CHOLHDL 4.0 11/01/2015   No results found for: LDLDIRECT Based on the results of lipid panel his/her cardiovascular risk factor ( using Poole Cohort )  in the next 10 years is: The 10-year ASCVD risk score (Goff DC Jr., et al., 2013) is: 3.4%   Values used to calculate the score:     Age: 58 years     Sex: Female     Is Non-Hispanic African American: No     Diabetic: No     Tobacco smoker: No     Systolic Blood Pressure: 130 mmHg     Is BP treated: No     HDL Cholesterol: 50 mg/dL     Total Cholesterol: 230 mg/dL Glucose:  Glucose, Bld  Date Value Ref Range Status  05/15/2017 99 65 - 99 mg/dL Final    Comment:    .            Fasting reference interval .   11/08/2015 99 65 - 99 mg/dL Final  11/01/2015 81 65 - 99  mg/dL Final   Hypertension: BP Readings from Last 3 Encounters:  11/10/19 130/78  05/06/18 128/82  05/14/17   136/76   Obesity: Wt Readings from Last 3 Encounters:  11/10/19 169 lb 1.6 oz (76.7 kg)  05/06/18 170 lb 14.4 oz (77.5 kg)  05/14/17 171 lb 1.6 oz (77.6 kg)   BMI Readings from Last 3 Encounters:  11/10/19 29.03 kg/m  05/06/18 29.33 kg/m  05/14/17 29.37 kg/m     Advanced Care Planning:  A voluntary discussion about advance care planning including the explanation and discussion of advance directives.     Social History      She        Social History   Socioeconomic History  . Marital status: Married    Spouse name: Mark  . Number of children: 1  . Years of education: Not on file  . Highest education level: High school graduate  Occupational History  . Not on file  Tobacco Use  . Smoking status: Former Smoker    Packs/day: 1.00    Years: 28.00    Pack years: 28.00    Types: Cigarettes  . Smokeless tobacco: Never Used  Vaping Use  . Vaping Use: Former  Substance and Sexual Activity  . Alcohol use: No    Alcohol/week: 0.0 standard drinks  . Drug use: No  . Sexual activity: Yes    Birth control/protection: Surgical  Other Topics Concern  . Not on file  Social History Narrative  . Not on file   Social Determinants of Health   Financial Resource Strain:   . Difficulty of Paying Living Expenses:   Food Insecurity:   . Worried About Running Out of Food in the Last Year:   . Ran Out of Food in the Last Year:   Transportation Needs: No Transportation Needs  . Lack of Transportation (Medical): No  . Lack of Transportation (Non-Medical): No  Physical Activity:   . Days of Exercise per Week:   . Minutes of Exercise per Session:   Stress:   . Feeling of Stress :   Social Connections:   . Frequency of Communication with Friends and Family:   . Frequency of Social Gatherings with Friends and Family:   . Attends Religious Services:   . Active Member  of Clubs or Organizations:   . Attends Club or Organization Meetings:   . Marital Status:     Family History        Family History  Problem Relation Age of Onset  . Colon cancer Brother        68   . Breast cancer Mother   . Breast cancer Maternal Aunt   . Pneumonia Father   . Diabetes Maternal Grandmother     Patient Active Problem List   Diagnosis Date Noted  . Hx of tobacco use, presenting hazards to health 02/24/2016  . Psoriasis 12/03/2015  . Overweight (BMI 25.0-29.9) 12/03/2015  . Vertigo 12/03/2015  . 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 Preventative health care 11/01/2015  . Breast cancer screening 11/01/2015  . Osteopenia 11/01/2015  . Pap smear for cervical cancer screening 11/01/2015  . Abnormal appearance of cervix 11/01/2015    Past Surgical History:  Procedure Laterality Date  . CHOLECYSTECTOMY  1990  . TUBAL LIGATION       Current Outpatient Medications:  .  clobetasol ointment (TEMOVATE) 0.05 %, Apply 1 application topically 2 (two) times daily as needed., Disp: 30 g, Rfl: 3 .  meclizine (ANTIVERT) 25 MG tablet, Take 1 tablet (25 mg total) by mouth 3 (three) times daily as needed for dizziness., Disp: 30 tablet, Rfl:   3 .  promethazine-dextromethorphan (PROMETHAZINE-DM) 6.25-15 MG/5ML syrup, Take 2.5 mLs by mouth 4 (four) times daily as needed., Disp: 118 mL, Rfl: 0  No Known Allergies  Patient Care Team: Tapia, Leisa, PA-C as PCP - General (Family Medicine) Lada, Melinda P, MD as Attending Physician (Family Medicine) Sankar, Seeplaputhur G, MD (General Surgery)  Review of Systems  10 Systems reviewed and are negative for acute change except as noted in the HPI.   I personally reviewed active problem list, medication list, allergies, family history, social history, health maintenance, notes from last encounter, lab results, imaging with the patient/caregiver today.        Objective:   Vitals:  Vitals:   11/10/19 1121  BP: 130/78  Pulse: 94    Resp: 16  Temp: (!) 97.5 F (36.4 C)  TempSrc: Temporal  SpO2: 99%  Weight: 169 lb 1.6 oz (76.7 kg)  Height: 5' 4" (1.626 m)    Body mass index is 29.03 kg/m.  Physical Exam Vitals and nursing note reviewed. Exam conducted with a chaperone present.  Constitutional:      General: She is not in acute distress.    Appearance: Normal appearance. She is well-developed. She is not toxic-appearing or diaphoretic.  HENT:     Head: Normocephalic and atraumatic.     Right Ear: External ear normal.     Left Ear: External ear normal.     Nose: Nose normal.     Mouth/Throat:     Pharynx: Uvula midline.  Eyes:     General: Lids are normal.     Conjunctiva/sclera: Conjunctivae normal.     Pupils: Pupils are equal, round, and reactive to light.  Neck:     Trachea: Phonation normal. No tracheal deviation.  Cardiovascular:     Rate and Rhythm: Normal rate and regular rhythm.     Pulses: Normal pulses.          Radial pulses are 2+ on the right side and 2+ on the left side.       Posterior tibial pulses are 2+ on the right side and 2+ on the left side.     Heart sounds: Normal heart sounds. No murmur heard.  No friction rub. No gallop.   Pulmonary:     Effort: Pulmonary effort is normal. No respiratory distress.     Breath sounds: Normal breath sounds. No stridor. No wheezing, rhonchi or rales.  Chest:     Chest wall: No tenderness.  Abdominal:     General: Bowel sounds are normal. There is no distension.     Palpations: Abdomen is soft.     Tenderness: There is no abdominal tenderness. There is no guarding or rebound.  Genitourinary:    Vagina: Normal.     Cervix: Normal.     Uterus: Normal.      Adnexa: Left adnexa normal.     Comments: PAP completed Musculoskeletal:        General: No deformity. Normal range of motion.     Cervical back: Normal range of motion and neck supple.  Lymphadenopathy:     Cervical: No cervical adenopathy.  Skin:    General: Skin is warm and dry.      Capillary Refill: Capillary refill takes less than 2 seconds.     Coloration: Skin is not pale.     Findings: No rash.  Neurological:     Mental Status: She is alert and oriented to person, place, and time.     Motor:   No abnormal muscle tone.     Gait: Gait normal.  Psychiatric:        Speech: Speech normal.        Behavior: Behavior normal.       Fall Risk: Fall Risk  11/10/2019 05/06/2018 05/14/2017 11/01/2015  Falls in the past year? 0 0 No No  Number falls in past yr: 0 0 - -  Injury with Fall? 0 0 - -  Follow up Falls evaluation completed - - -    Functional Status Survey: Is the patient deaf or have difficulty hearing?: No Does the patient have difficulty seeing, even when wearing glasses/contacts?: No Does the patient have difficulty concentrating, remembering, or making decisions?: No Does the patient have difficulty walking or climbing stairs?: No Does the patient have difficulty dressing or bathing?: No Does the patient have difficulty doing errands alone such as visiting a doctor's office or shopping?: No   Assessment & Plan:    CPE completed today  . USPSTF grade A and B recommendations reviewed with patient; age-appropriate recommendations, preventive care, screening tests, etc discussed and encouraged; healthy living encouraged; see AVS for patient education given to patient  . Discussed importance of 150 minutes of physical activity weekly, AHA exercise recommendations given to pt in AVS/handout  . Discussed importance of healthy diet:  eating lean meats and proteins, avoiding trans fats and saturated fats, avoid simple sugars and excessive carbs in diet, eat 6 servings of fruit/vegetables daily and drink plenty of water and avoid sweet beverages.    . Recommended pt to do annual eye exam and routine dental exams/cleanings  . Depression, alcohol, fall screening completed as documented above and per flowsheets  . Reviewed Health Maintenance: Health  Maintenance  Topic Date Due  . Hepatitis C Screening  Never done  . COVID-19 Vaccine (1) Never done  . HIV Screening  Never done  . TETANUS/TDAP  Never done  . MAMMOGRAM  10/19/2018  . PAP SMEAR-Modifier  11/01/2018  . COLONOSCOPY  07/07/2019  . INFLUENZA VACCINE  11/08/2019      ICD-10-CM   1. Annual physical exam  Z00.00 Hepatitis C Antibody    Cytology - PAP    Lipid panel    CBC w/Diff/Platelet    COMPLETE METABOLIC PANEL WITH GFR    TSH  2. Screening for cervical cancer  Z12.4 Cytology - PAP   hx of abnormal pap in the past  3. Encounter for hepatitis C screening test for low risk patient  Z11.59 Hepatitis C Antibody  4. Abnormal finding on breast imaging  R92.8 US BREAST LTD UNI RIGHT INC AXILLA   diagnostic bilateral mammogram and unilateral US breast and axilla ordered - pt will f/up at [place]  5. Encounter for screening mammogram for malignant neoplasm of breast  Z12.31 US BREAST LTD UNI RIGHT INC AXILLA  6. Postmenopausal estrogen deficiency  Z78.0 DG Bone Density  7. Hyperlipidemia, unspecified hyperlipidemia type  E78.5    very high LDL in the past, discussed diet/lifestyle and statins with pt today        Leisa Tapia, PA-C 11/10/19 11:19 AM  [facility] 

## 2019-11-10 NOTE — Patient Instructions (Signed)
[facility] at [facility] [address] [place],  [place]  [address] Get Driving Directions Main: [phone]   The 10-year ASCVD risk score Denman George DC Montez Hageman., et al., 2013) is: 3.5%   Values used to calculate the score:     Age: 59 years     Sex: Female     Is Non-Hispanic African American: No     Diabetic: No     Tobacco smoker: No     Systolic Blood Pressure: 130 mmHg     Is BP treated: No     HDL Cholesterol: 52 mg/dL     Total Cholesterol: 249 mg/dL   Recommendations on cholesterol and starting statins.    There is a benefit from LDL-C (bad cholesterol) lowering with statin therapy at virtually all levels of cardiovascular risk.  If statin therapy had no side effects and caused no financial burden, it might be reasonable to recommend it to virtually all at-risk individuals, similar to a healthy diet and exercise  It is this good of a medication!!  It reduces risk in almost everyone.   There are possible side effects with ALL medications and with statins there is a small subset of the population who may not metabolize it well, which causing muscle aches as a side effect (~5%).  We monitor for this, can test for this, and usually are careful to work with you to get a medication that gives you the benefits with minimal side effects.  Some people are sensitive to medications in general and we try to use the highest dose tolerated to give the most benefit.   American Heart Association/American College of Cardiology cholesterol and statin guidelines are as follows:  In adults 44 to 59 years of age without diabetes mellitus and with LDL-C levels ?70, at a 10-year atherosclerotic cardiovascular disease risk of ?7.5 percent, start a moderate-intensity statin if a discussion of treatment options favors statin therapy  If LDL is >160, statins are indicated.  Patients with other significant risk factors would also benefit from statin.  Some of these factors include a  family history of premature cardiovascular disease, chronic kidney disease, or chronic inflammatory disorder (such as chronic human immunodeficiency viral infection).   Can get more information at the following website:  CobrandedAffiliateProgram.fi       Preventive Care 12-33 Years Old, Female Preventive care refers to visits with your health care provider and lifestyle choices that can promote health and wellness. This includes:  A yearly physical exam. This may also be called an annual well check.  Regular dental visits and eye exams.  Immunizations.  Screening for certain conditions.  Healthy lifestyle choices, such as eating a healthy diet, getting regular exercise, not using drugs or products that contain nicotine and tobacco, and limiting alcohol use. What can I expect for my preventive care visit? Physical exam Your health care provider will check your:  Height and weight. This may be used to calculate body mass index (BMI), which tells if you are at a healthy weight.  Heart rate and blood pressure.  Skin for abnormal spots. Counseling Your health care provider may ask you questions about your:  Alcohol, tobacco, and drug use.  Emotional well-being.  Home and relationship well-being.  Sexual activity.  Eating habits.  Work and work Astronomer.  Method of birth control.  Menstrual cycle.  Pregnancy history. What immunizations do I need?  Influenza (flu) vaccine  This is recommended every year. Tetanus, diphtheria, and pertussis (Tdap)  vaccine  You may need a Td booster every 10 years. Varicella (chickenpox) vaccine  You may need this if you have not been vaccinated. Zoster (shingles) vaccine  You may need this after age 35. Measles, mumps, and rubella (MMR) vaccine  You may need at least one dose of MMR if you were born in 1957 or later. You may also need a second dose. Pneumococcal  conjugate (PCV13) vaccine  You may need this if you have certain conditions and were not previously vaccinated. Pneumococcal polysaccharide (PPSV23) vaccine  You may need one or two doses if you smoke cigarettes or if you have certain conditions. Meningococcal conjugate (MenACWY) vaccine  You may need this if you have certain conditions. Hepatitis A vaccine  You may need this if you have certain conditions or if you travel or work in places where you may be exposed to hepatitis A. Hepatitis B vaccine  You may need this if you have certain conditions or if you travel or work in places where you may be exposed to hepatitis B. Haemophilus influenzae type b (Hib) vaccine  You may need this if you have certain conditions. Human papillomavirus (HPV) vaccine  If recommended by your health care provider, you may need three doses over 6 months. You may receive vaccines as individual doses or as more than one vaccine together in one shot (combination vaccines). Talk with your health care provider about the risks and benefits of combination vaccines. What tests do I need? Blood tests  Lipid and cholesterol levels. These may be checked every 5 years, or more frequently if you are over 2 years old.  Hepatitis C test.  Hepatitis B test. Screening  Lung cancer screening. You may have this screening every year starting at age 72 if you have a 30-pack-year history of smoking and currently smoke or have quit within the past 15 years.  Colorectal cancer screening. All adults should have this screening starting at age 9 and continuing until age 31. Your health care provider may recommend screening at age 85 if you are at increased risk. You will have tests every 1-10 years, depending on your results and the type of screening test.  Diabetes screening. This is done by checking your blood sugar (glucose) after you have not eaten for a while (fasting). You may have this done every 1-3  years.  Mammogram. This may be done every 1-2 years. Talk with your health care provider about when you should start having regular mammograms. This may depend on whether you have a family history of breast cancer.  BRCA-related cancer screening. This may be done if you have a family history of breast, ovarian, tubal, or peritoneal cancers.  Pelvic exam and Pap test. This may be done every 3 years starting at age 1. Starting at age 72, this may be done every 5 years if you have a Pap test in combination with an HPV test. Other tests  Sexually transmitted disease (STD) testing.  Bone density scan. This is done to screen for osteoporosis. You may have this scan if you are at high risk for osteoporosis. Follow these instructions at home: Eating and drinking  Eat a diet that includes fresh fruits and vegetables, whole grains, lean protein, and low-fat dairy.  Take vitamin and mineral supplements as recommended by your health care provider.  Do not drink alcohol if: ? Your health care provider tells you not to drink. ? You are pregnant, may be pregnant, or are planning to become pregnant.  If you drink alcohol: ? Limit how much you have to 0-1 drink a day. ? Be aware of how much alcohol is in your drink. In the [place] one drink equals one 12 oz bottle of beer (355 mL), one 5 oz glass of wine (148 mL), or one 1 oz glass of hard liquor (44 mL). Lifestyle  Take daily care of your teeth and gums.  Stay active. Exercise for at least 30 minutes on 5 or more days each week.  Do not use any products that contain nicotine or tobacco, such as cigarettes, e-cigarettes, and chewing tobacco. If you need help quitting, ask your health care provider.  If you are sexually active, practice safe sex. Use a condom or other form of birth control (contraception) in order to prevent pregnancy and STIs (sexually transmitted infections).  If told by your health care provider, take low-dose aspirin daily  starting at age 73. What's next?  Visit your health care provider once a year for a well check visit.  Ask your health care provider how often you should have your eyes and teeth checked.  Stay up to date on all vaccines. This information is not intended to replace advice given to you by your health care provider. Make sure you discuss any questions you have with your health care provider. Document Revised: 12/05/2017 Document Reviewed: 12/05/2017 Elsevier Patient Education  2020 [employer].

## 2019-11-11 DIAGNOSIS — Z0000 Encounter for general adult medical examination without abnormal findings: Secondary | ICD-10-CM | POA: Diagnosis not present

## 2019-11-11 DIAGNOSIS — Z1159 Encounter for screening for other viral diseases: Secondary | ICD-10-CM | POA: Diagnosis not present

## 2019-11-12 LAB — CBC WITH DIFFERENTIAL/PLATELET
Absolute Monocytes: 483 cells/uL (ref 200–950)
Basophils Absolute: 104 cells/uL (ref 0–200)
Basophils Relative: 1.5 %
Eosinophils Absolute: 200 cells/uL (ref 15–500)
Eosinophils Relative: 2.9 %
HCT: 41.2 % (ref 35.0–45.0)
Hemoglobin: 13.8 g/dL (ref 11.7–15.5)
Lymphs Abs: 2594 cells/uL (ref 850–3900)
MCH: 28.7 pg (ref 27.0–33.0)
MCHC: 33.5 g/dL (ref 32.0–36.0)
MCV: 85.7 fL (ref 80.0–100.0)
MPV: 11.1 fL (ref 7.5–12.5)
Monocytes Relative: 7 %
Neutro Abs: 3519 cells/uL (ref 1500–7800)
Neutrophils Relative %: 51 %
Platelets: 277 10*3/uL (ref 140–400)
RBC: 4.81 10*6/uL (ref 3.80–5.10)
RDW: 13.1 % (ref 11.0–15.0)
Total Lymphocyte: 37.6 %
WBC: 6.9 10*3/uL (ref 3.8–10.8)

## 2019-11-12 LAB — COMPLETE METABOLIC PANEL WITH GFR
AG Ratio: 1.8 (calc) (ref 1.0–2.5)
ALT: 22 U/L (ref 6–29)
AST: 17 U/L (ref 10–35)
Albumin: 4.6 g/dL (ref 3.6–5.1)
Alkaline phosphatase (APISO): 61 U/L (ref 37–153)
BUN: 14 mg/dL (ref 7–25)
CO2: 27 mmol/L (ref 20–32)
Calcium: 9.9 mg/dL (ref 8.6–10.4)
Chloride: 105 mmol/L (ref 98–110)
Creat: 0.91 mg/dL (ref 0.50–1.05)
GFR, Est African American: 81 mL/min/{1.73_m2} (ref >=60)
GFR, Est Non African American: 70 mL/min/{1.73_m2} (ref >=60)
Globulin: 2.6 g/dL (calc) (ref 1.9–3.7)
Glucose, Bld: 93 mg/dL (ref 65–99)
Potassium: 4 mmol/L (ref 3.5–5.3)
Sodium: 140 mmol/L (ref 135–146)
Total Bilirubin: 0.5 mg/dL (ref 0.2–1.2)
Total Protein: 7.2 g/dL (ref 6.1–8.1)

## 2019-11-12 LAB — HEPATITIS C ANTIBODY
Hepatitis C Ab: NONREACTIVE
SIGNAL TO CUT-OFF: 0.01 (ref <1.00)

## 2019-11-12 LAB — LIPID PANEL
Cholesterol: 230 mg/dL — ABNORMAL HIGH (ref <200)
HDL: 50 mg/dL (ref >=50)
LDL Cholesterol (Calc): 144 mg/dL (calc) — ABNORMAL HIGH
Non-HDL Cholesterol (Calc): 180 mg/dL (calc) — ABNORMAL HIGH (ref <130)
Total CHOL/HDL Ratio: 4.6 (calc) (ref <5.0)
Triglycerides: 218 mg/dL — ABNORMAL HIGH (ref <150)

## 2019-11-12 LAB — TSH: TSH: 1.55 mIU/L (ref 0.40–4.50)

## 2019-11-13 ENCOUNTER — Encounter: Payer: Self-pay | Admitting: Family Medicine

## 2019-11-13 LAB — CYTOLOGY - PAP
Comment: NEGATIVE
Diagnosis: NEGATIVE
High risk HPV: NEGATIVE

## 2019-11-15 ENCOUNTER — Encounter: Payer: Self-pay | Admitting: Family Medicine

## 2019-11-16 ENCOUNTER — Encounter: Payer: Self-pay | Admitting: Family Medicine

## 2019-11-16 DIAGNOSIS — R928 Other abnormal and inconclusive findings on diagnostic imaging of breast: Secondary | ICD-10-CM

## 2019-12-01 ENCOUNTER — Ambulatory Visit
Admission: RE | Admit: 2019-12-01 | Discharge: 2019-12-01 | Disposition: A | Discharge status: Home / Self Care | Payer: BC Managed Care – PPO | Source: Ambulatory Visit | Attending: Family Medicine | Admitting: Family Medicine

## 2019-12-01 ENCOUNTER — Other Ambulatory Visit: Payer: Self-pay

## 2019-12-01 DIAGNOSIS — Z1231 Encounter for screening mammogram for malignant neoplasm of breast: Secondary | ICD-10-CM | POA: Diagnosis not present

## 2019-12-01 DIAGNOSIS — R928 Other abnormal and inconclusive findings on diagnostic imaging of breast: Secondary | ICD-10-CM

## 2019-12-01 DIAGNOSIS — R922 Inconclusive mammogram: Secondary | ICD-10-CM | POA: Diagnosis not present

## 2019-12-01 DIAGNOSIS — N6011 Diffuse cystic mastopathy of right breast: Secondary | ICD-10-CM | POA: Diagnosis not present

## 2020-02-18 DIAGNOSIS — Z20822 Contact with and (suspected) exposure to covid-19: Secondary | ICD-10-CM | POA: Diagnosis not present

## 2020-02-18 DIAGNOSIS — R42 Dizziness and giddiness: Secondary | ICD-10-CM | POA: Diagnosis not present

## 2020-04-20 ENCOUNTER — Encounter: Payer: Self-pay | Admitting: Family Medicine

## 2020-04-20 DIAGNOSIS — Z1211 Encounter for screening for malignant neoplasm of colon: Secondary | ICD-10-CM

## 2020-05-09 ENCOUNTER — Telehealth: Payer: Self-pay

## 2020-05-09 DIAGNOSIS — Z1211 Encounter for screening for malignant neoplasm of colon: Secondary | ICD-10-CM

## 2020-05-09 NOTE — Telephone Encounter (Signed)
Copied from [place] [phone]. Topic: General - Other >> May 09, 2020  9:17 AM Hinda Lenis D wrote: PT requesting her lab work from 11/11/19 to be print so she can pick it up / please advise

## 2020-05-09 NOTE — Telephone Encounter (Signed)
Patient notified paperwork ay front desk to be picked up

## 2020-05-19 ENCOUNTER — Other Ambulatory Visit: Payer: Self-pay

## 2020-05-19 ENCOUNTER — Telehealth (INDEPENDENT_AMBULATORY_CARE_PROVIDER_SITE_OTHER): Payer: Self-pay | Admitting: Gastroenterology

## 2020-05-19 DIAGNOSIS — Z86010 Personal history of colonic polyps: Secondary | ICD-10-CM

## 2020-05-19 MED ORDER — PEG 3350-KCL-NA BICARB-NACL 420 G PO SOLR: 4000.0000 mL | Freq: Once | ORAL | 0 refills | Status: AC

## 2020-05-19 NOTE — Progress Notes (Signed)
Gastroenterology Pre-Procedure Review  Request Date: 07/15/20 Requesting Physician: Dr. Allen Norris  PATIENT REVIEW QUESTIONS: The patient responded to the following health history questions as indicated:    1. Are you having any GI issues? no 2. Do you have a personal history of Polyps? yes (07/07/14 performed by Dr. Jamal Collin) 3. Do you have a family history of Colon Cancer or Polyps? yes (half brother colon cancer) 4. Diabetes Mellitus? no 5. Joint replacements in the past 12 months?no 6. Major health problems in the past 3 months?no 7. Any artificial heart valves, MVP, or defibrillator?no    MEDICATIONS & ALLERGIES:    Patient reports the following regarding taking any anticoagulation/antiplatelet therapy:   Plavix, Coumadin, Eliquis, Xarelto, Lovenox, Pradaxa, Brilinta, or Effient? no Aspirin? no  Patient confirms/reports the following medications:  Current Outpatient Medications  Medication Sig Dispense Refill  . clobetasol ointment (TEMOVATE) 7.02 % Apply 1 application topically 2 (two) times daily as needed. 30 g 3  . meclizine (ANTIVERT) 25 MG tablet Take 1 tablet (25 mg total) by mouth 3 (three) times daily as needed for dizziness. 30 tablet 3  . promethazine-dextromethorphan (PROMETHAZINE-DM) 6.25-15 MG/5ML syrup Take 2.5 mLs by mouth 4 (four) times daily as needed. (Patient not taking: No sig reported) 118 mL 0   No current facility-administered medications for this visit.    Patient confirms/reports the following allergies:  No Known Allergies  No orders of the defined types were placed in this encounter.   AUTHORIZATION INFORMATION Primary Insurance: 1D#: Group #:  Secondary Insurance: 1D#: Group #:  SCHEDULE INFORMATION: Date: 07/15/20 Time: Location: [place]

## 2020-07-11 ENCOUNTER — Other Ambulatory Visit: Payer: Self-pay

## 2020-07-11 ENCOUNTER — Encounter: Payer: Self-pay | Admitting: Gastroenterology

## 2020-07-13 ENCOUNTER — Other Ambulatory Visit
Admission: RE | Admit: 2020-07-13 | Discharge: 2020-07-13 | Disposition: A | Discharge status: Home / Self Care | Payer: BC Managed Care – PPO | Source: Ambulatory Visit | Attending: Gastroenterology | Admitting: Gastroenterology

## 2020-07-13 ENCOUNTER — Other Ambulatory Visit: Payer: Self-pay

## 2020-07-13 DIAGNOSIS — Z8616 Personal history of COVID-19: Secondary | ICD-10-CM | POA: Diagnosis not present

## 2020-07-13 DIAGNOSIS — K640 First degree hemorrhoids: Secondary | ICD-10-CM | POA: Diagnosis not present

## 2020-07-13 DIAGNOSIS — Z01812 Encounter for preprocedural laboratory examination: Secondary | ICD-10-CM | POA: Insufficient documentation

## 2020-07-13 DIAGNOSIS — Z86010 Personal history of colonic polyps: Secondary | ICD-10-CM | POA: Diagnosis not present

## 2020-07-13 DIAGNOSIS — Z1211 Encounter for screening for malignant neoplasm of colon: Secondary | ICD-10-CM | POA: Diagnosis not present

## 2020-07-13 DIAGNOSIS — Z87891 Personal history of nicotine dependence: Secondary | ICD-10-CM | POA: Diagnosis not present

## 2020-07-13 DIAGNOSIS — Z20822 Contact with and (suspected) exposure to covid-19: Secondary | ICD-10-CM | POA: Insufficient documentation

## 2020-07-13 LAB — SARS CORONAVIRUS 2 (TAT 6-24 HRS): SARS Coronavirus 2: NEGATIVE

## 2020-07-14 NOTE — Discharge Instructions (Signed)

## 2020-07-15 ENCOUNTER — Ambulatory Visit: Payer: BC Managed Care – PPO | Admitting: Anesthesiology

## 2020-07-15 ENCOUNTER — Ambulatory Visit
Admission: RE | Admit: 2020-07-15 | Discharge: 2020-07-15 | Disposition: A | Discharge status: Home / Self Care | Payer: BC Managed Care – PPO | Attending: Gastroenterology | Admitting: Gastroenterology

## 2020-07-15 ENCOUNTER — Encounter
Admission: RE | Disposition: A | Discharge status: Home / Self Care | Payer: Self-pay | Source: Home / Self Care | Attending: Gastroenterology

## 2020-07-15 ENCOUNTER — Other Ambulatory Visit: Payer: Self-pay

## 2020-07-15 ENCOUNTER — Encounter: Payer: Self-pay | Admitting: Gastroenterology

## 2020-07-15 DIAGNOSIS — K640 First degree hemorrhoids: Secondary | ICD-10-CM | POA: Diagnosis not present

## 2020-07-15 DIAGNOSIS — Z87891 Personal history of nicotine dependence: Secondary | ICD-10-CM | POA: Insufficient documentation

## 2020-07-15 DIAGNOSIS — Z860100 Personal history of colon polyps, unspecified: Secondary | ICD-10-CM

## 2020-07-15 DIAGNOSIS — Z8616 Personal history of COVID-19: Secondary | ICD-10-CM | POA: Insufficient documentation

## 2020-07-15 DIAGNOSIS — Z1211 Encounter for screening for malignant neoplasm of colon: Secondary | ICD-10-CM | POA: Diagnosis not present

## 2020-07-15 DIAGNOSIS — Z20822 Contact with and (suspected) exposure to covid-19: Secondary | ICD-10-CM | POA: Diagnosis not present

## 2020-07-15 HISTORY — DX: Dizziness and giddiness: R42

## 2020-07-15 HISTORY — DX: Presence of dental prosthetic device (complete) (partial): Z97.2

## 2020-07-15 HISTORY — DX: Motion sickness, initial encounter: T75.3XXA

## 2020-07-15 HISTORY — PX: COLONOSCOPY WITH PROPOFOL: SHX5780

## 2020-07-15 SURGERY — COLONOSCOPY WITH PROPOFOL
Anesthesia: General | Site: Rectum

## 2020-07-15 MED ORDER — STERILE WATER FOR IRRIGATION IR SOLN: Status: DC | PRN

## 2020-07-15 MED ORDER — LIDOCAINE HCL (CARDIAC) PF 100 MG/5ML IV SOSY
PREFILLED_SYRINGE | INTRAVENOUS | Status: DC | PRN
  Administered 2020-07-15: 50 mg via INTRAVENOUS

## 2020-07-15 MED ORDER — PROPOFOL 10 MG/ML IV BOLUS
INTRAVENOUS | Status: DC | PRN
  Administered 2020-07-15: 100 mg via INTRAVENOUS
  Administered 2020-07-15 (×2): 50 mg via INTRAVENOUS

## 2020-07-15 MED ORDER — ACETAMINOPHEN 325 MG PO TABS: 325.0000 mg | ORAL_TABLET | Freq: Once | ORAL | Status: DC

## 2020-07-15 MED ORDER — ACETAMINOPHEN 160 MG/5ML PO SOLN: 325.0000 mg | Freq: Once | ORAL | Status: DC

## 2020-07-15 MED ORDER — LACTATED RINGERS IV SOLN: INTRAVENOUS | Status: DC

## 2020-07-15 SURGICAL SUPPLY — 6 items
GOWN CVR UNV OPN BCK APRN NK (MISCELLANEOUS) ×2 IMPLANT
GOWN ISOL THUMB LOOP REG UNIV (MISCELLANEOUS) ×4
KIT PRC NS LF DISP ENDO (KITS) ×1 IMPLANT
KIT PROCEDURE OLYMPUS (KITS) ×2
MANIFOLD NEPTUNE II (INSTRUMENTS) ×2 IMPLANT
WATER STERILE IRR 250ML POUR (IV SOLUTION) ×2 IMPLANT

## 2020-07-15 NOTE — Transfer of Care (Signed)
Immediate Anesthesia Transfer of Care Note  Patient: Tanya Howell  Procedure(s) Performed: COLONOSCOPY WITH PROPOFOL (N/A Rectum)  Patient Location: PACU  Anesthesia Type: General  Level of Consciousness: awake, alert  and patient cooperative  Airway and Oxygen Therapy: Patient Spontanous Breathing and Patient connected to supplemental oxygen  Post-op Assessment: Post-op Vital signs reviewed, Patient's Cardiovascular Status Stable, Respiratory Function Stable, Patent Airway and No signs of Nausea or vomiting  Post-op Vital Signs: Reviewed and stable  Complications: No complications documented.

## 2020-07-15 NOTE — H&P (Signed)
Tanya Lame, MD [address] [place], [place] [address] [phone] Fax : [phone]  Primary Care Physician:  Delsa Grana, PA-C Primary Gastroenterologist:  Dr. Allen Norris  Pre-Procedure History & Physical: HPI:  Tanya Howell is a 60 y.o. female is here for an colonoscopy.   Past Medical History:  Diagnosis Date  . COVID-19 07/2018  . Hyperlipidemia   . Motion sickness    amuesment park rides  . Osteopenia 11/01/2015  . Psoriasis   . Vertigo    none for over 5 yrs  . Wears dentures    full upper and lower    Past Surgical History:  Procedure Laterality Date  . CHOLECYSTECTOMY  1990  . TUBAL LIGATION      Prior to Admission medications   Medication Sig Start Date End Date Taking? Authorizing Provider  clobetasol ointment (TEMOVATE) 2.95 % Apply 1 application topically 2 (two) times daily as needed. 11/30/15  Yes Lada, Satira Anis, MD  meclizine (ANTIVERT) 25 MG tablet Take 1 tablet (25 mg total) by mouth 3 (three) times daily as needed for dizziness. 05/14/17  Yes Arnetha Courser, MD    Allergies as of 05/19/2020  . (No Known Allergies)    Family History  Problem Relation Age of Onset  . Colon cancer Brother        32   . Breast cancer Mother   . Breast cancer Maternal Aunt   . Pneumonia Father   . Diabetes Maternal Grandmother     Social History   Socioeconomic History  . Marital status: Married    Spouse name: Elta Guadeloupe  . Number of children: 1  . Years of education: Not on file  . Highest education level: High school graduate  Occupational History  . Not on file  Tobacco Use  . Smoking status: Former Smoker    Packs/day: 1.00    Years: 28.00    Pack years: 28.00    Types: Cigarettes    Quit date: 2011    Years since quitting: 11.2  . Smokeless tobacco: Never Used  Vaping Use  . Vaping Use: Former  Substance and Sexual Activity  . Alcohol use: No    Alcohol/week: 0.0 standard drinks    Comment: rare  . Drug use: No  .  Sexual activity: Yes    Birth control/protection: Surgical  Other Topics Concern  . Not on file  Social History Narrative  . Not on file   Social Determinants of Health   Financial Resource Strain: Not on file  Food Insecurity: Not on file  Transportation Needs: No Transportation Needs  . Lack of Transportation (Medical): No  . Lack of Transportation (Non-Medical): No  Physical Activity: Not on file  Stress: Not on file  Social Connections: Not on file  Intimate Partner Violence: Not At Risk  . Fear of Current or Ex-Partner: No  . Emotionally Abused: No  . Physically Abused: No  . Sexually Abused: No    Review of Systems: See HPI, otherwise negative ROS  Physical Exam: BP (!) 155/80   Pulse 94   Temp (!) 97 F (36.1 C) (Temporal)   Ht 5\' 4"  (1.626 m)   Wt 77.1 kg   SpO2 100%   BMI 29.18 kg/m  General:   Alert,  pleasant and cooperative in NAD Head:  Normocephalic and atraumatic. Neck:  Supple; no masses or thyromegaly. Lungs:  Clear throughout to auscultation.    Heart:  Regular rate and rhythm. Abdomen:  Soft, nontender and nondistended. Normal bowel sounds, without guarding, and without rebound.   Neurologic:  Alert and  oriented x4;  grossly normal neurologically.  Impression/Plan: Tanya Howell is here for an colonoscopy to be performed for a history of adenomatous polyps on 06/2014   Risks, benefits, limitations, and alternatives regarding  colonoscopy have been reviewed with the patient.  Questions have been answered.  All parties agreeable.   Tanya Lame, MD  07/15/2020, 10:07 AM

## 2020-07-15 NOTE — Anesthesia Preprocedure Evaluation (Signed)
Anesthesia Evaluation  Patient identified by MRN, date of birth, ID band Patient awake    Reviewed: Allergy & Precautions, H&P , NPO status , Patient's Chart, lab work & pertinent test results  Airway Mallampati: II  TM Distance: >3 FB Neck ROM: full    Dental no notable dental hx.    Pulmonary former smoker,    Pulmonary exam normal breath sounds clear to auscultation       Cardiovascular Normal cardiovascular exam Rhythm:regular Rate:Normal     Neuro/Psych    GI/Hepatic   Endo/Other    Renal/GU      Musculoskeletal   Abdominal   Peds  Hematology   Anesthesia Other Findings   Reproductive/Obstetrics                             Anesthesia Physical Anesthesia Plan  ASA: II  Anesthesia Plan: General   Post-op Pain Management:    Induction: Intravenous  PONV Risk Score and Plan: 3 and Treatment may vary due to age or medical condition, Propofol infusion and TIVA  Airway Management Planned: Natural Airway  Additional Equipment:   Intra-op Plan:   Post-operative Plan:   Informed Consent: I have reviewed the patients History and Physical, chart, labs and discussed the procedure including the risks, benefits and alternatives for the proposed anesthesia with the patient or authorized representative who has indicated his/her understanding and acceptance.     Dental Advisory Given  Plan Discussed with: CRNA  Anesthesia Plan Comments:         Anesthesia Quick Evaluation

## 2020-07-15 NOTE — Anesthesia Postprocedure Evaluation (Signed)
Anesthesia Post Note  Patient: Tanya Howell  Procedure(s) Performed: COLONOSCOPY WITH PROPOFOL (N/A Rectum)     Patient location during evaluation: PACU Anesthesia Type: General Level of consciousness: awake and alert and oriented Pain management: satisfactory to patient Vital Signs Assessment: post-procedure vital signs reviewed and stable Respiratory status: spontaneous breathing, nonlabored ventilation and respiratory function stable Cardiovascular status: blood pressure returned to baseline and stable Postop Assessment: Adequate PO intake and No signs of nausea or vomiting Anesthetic complications: no   No complications documented.  Raliegh Ip

## 2020-07-15 NOTE — Op Note (Signed)
[facility] Gastroenterology Patient Name: Tanya Howell Procedure Date: 07/15/2020 10:04 AM MRN: [id number] Account #: [id number] Date of Birth: 21-Feb-1961 Admit Type: Outpatient Age: 60 Room: [facility] OR ROOM 01 Gender: Female Note Status: Finalized Procedure:             Colonoscopy Indications:           High risk colon cancer surveillance: Personal history                         of colonic polyps Providers:             Lucilla Lame MD, MD Referring MD:          Delsa Grana (Referring MD) Medicines:             Propofol per Anesthesia Complications:         No immediate complications. Procedure:             Pre-Anesthesia Assessment:                        - Prior to the procedure, a History and Physical was                         performed, and patient medications and allergies were                         reviewed. The patient's tolerance of previous                         anesthesia was also reviewed. The risks and benefits                         of the procedure and the sedation options and risks                         were discussed with the patient. All questions were                         answered, and informed consent was obtained. Prior                         Anticoagulants: The patient has taken no previous                         anticoagulant or antiplatelet agents. ASA Grade                         Assessment: II - A patient with mild systemic disease.                         After reviewing the risks and benefits, the patient                         was deemed in satisfactory condition to undergo the                         procedure.  After obtaining informed consent, the colonoscope was                         passed under direct vision. Throughout the procedure,                         the patient's blood pressure, pulse, and oxygen                         saturations were monitored continuously. The                          Colonoscope was introduced through the anus and                         advanced to the the cecum, identified by appendiceal                         orifice and ileocecal valve. The colonoscopy was                         performed without difficulty. The patient tolerated                         the procedure well. The quality of the bowel                         preparation was excellent. Findings:      The perianal and digital rectal examinations were normal.      Non-bleeding internal hemorrhoids were found during retroflexion. The       hemorrhoids were Grade I (internal hemorrhoids that do not prolapse). Impression:            - Non-bleeding internal hemorrhoids.                        - No specimens collected. Recommendation:        - Discharge patient to home.                        - Resume previous diet.                        - Continue present medications.                        - Repeat colonoscopy in 7 years for surveillance. Procedure Code(s):     --- Professional ---                        [phone], Colonoscopy, flexible; diagnostic, including                         collection of specimen(s) by brushing or washing, when                         performed (separate procedure) Diagnosis Code(s):     --- Professional ---                        Z86.010, Personal history of colonic polyps  CPT copyright 2019 American Medical Association. All rights reserved. The codes documented in this report are preliminary and upon coder review may  be revised to meet current compliance requirements. Lucilla Lame MD, MD 07/15/2020 10:28:46 AM This report has been signed electronically. Number of Addenda: 0 Note Initiated On: 07/15/2020 10:04 AM Scope Withdrawal Time: 0 hours 7 minutes 23 seconds  Total Procedure Duration: 0 hours 9 minutes 17 seconds  Estimated Blood Loss:  Estimated blood loss: none.      [facility]

## 2020-07-15 NOTE — Anesthesia Procedure Notes (Signed)
Procedure Name: General with mask airway Date/Time: 07/15/2020 10:14 AM Performed by: Jeannene Patella, CRNA Pre-anesthesia Checklist: Patient identified, Emergency Drugs available, Suction available, Timeout performed and Patient being monitored Patient Re-evaluated:Patient Re-evaluated prior to induction Oxygen Delivery Method: Nasal cannula Placement Confirmation: positive ETCO2

## 2020-07-18 ENCOUNTER — Encounter: Payer: Self-pay | Admitting: Gastroenterology

## 2020-11-22 ENCOUNTER — Ambulatory Visit: Payer: BC Managed Care – PPO | Admitting: Adult Health

## 2020-11-24 ENCOUNTER — Encounter: Payer: Self-pay | Admitting: Adult Health

## 2020-12-05 ENCOUNTER — Encounter: Payer: Self-pay | Admitting: Adult Health

## 2020-12-19 ENCOUNTER — Other Ambulatory Visit: Payer: Self-pay

## 2020-12-19 ENCOUNTER — Encounter: Payer: Self-pay | Admitting: Family Medicine

## 2020-12-19 ENCOUNTER — Ambulatory Visit: Payer: BC Managed Care – PPO | Admitting: Family Medicine

## 2020-12-19 VITALS — BP 119/62 | HR 83 | Temp 97.8°F | Resp 16 | Ht 64.0 in | Wt 173.4 lb

## 2020-12-19 DIAGNOSIS — Z114 Encounter for screening for human immunodeficiency virus [HIV]: Secondary | ICD-10-CM

## 2020-12-19 DIAGNOSIS — Z1231 Encounter for screening mammogram for malignant neoplasm of breast: Secondary | ICD-10-CM

## 2020-12-19 DIAGNOSIS — E663 Overweight: Secondary | ICD-10-CM

## 2020-12-19 DIAGNOSIS — Z87891 Personal history of nicotine dependence: Secondary | ICD-10-CM

## 2020-12-19 DIAGNOSIS — E785 Hyperlipidemia, unspecified: Secondary | ICD-10-CM | POA: Diagnosis not present

## 2020-12-19 DIAGNOSIS — M8580 Other specified disorders of bone density and structure, unspecified site: Secondary | ICD-10-CM | POA: Diagnosis not present

## 2020-12-19 DIAGNOSIS — Z0000 Encounter for general adult medical examination without abnormal findings: Secondary | ICD-10-CM | POA: Diagnosis not present

## 2020-12-19 LAB — LIPID PANEL
Cholesterol: 255 mg/dL — ABNORMAL HIGH (ref <200)
HDL: 53 mg/dL (ref >=50)
LDL Cholesterol (Calc): 166 mg/dL (calc) — ABNORMAL HIGH
Non-HDL Cholesterol (Calc): 202 mg/dL (calc) — ABNORMAL HIGH (ref <130)
Total CHOL/HDL Ratio: 4.8 (calc) (ref <5.0)
Triglycerides: 202 mg/dL — ABNORMAL HIGH (ref <150)

## 2020-12-19 MED ORDER — MECLIZINE HCL 25 MG PO TABS: 25.0000 mg | ORAL_TABLET | Freq: Three times a day (TID) | ORAL | 3 refills | Status: DC | PRN

## 2020-12-19 NOTE — Assessment & Plan Note (Signed)
LDL 190 on bloodwork received through work, ASCVD risk 3.7%. will repeat fasting labs today.

## 2020-12-19 NOTE — Progress Notes (Signed)
BP 119/62   Pulse 83   Temp 97.8 F (36.6 C)   Resp 16   Ht '5\' 4"'$  (1.626 m)   Wt 173 lb 6.4 oz (78.7 kg)   SpO2 98%   BMI 29.76 kg/m    Subjective:    Patient ID: Tanya Howell, female    DOB: 1960/11/04, 60 y.o.   MRN: [id number]  HPI: Tanya Howell is a 60 y.o. female presenting on 12/19/2020 for comprehensive medical examination. Current medical complaints include:none  Depression Screen done today and results listed below:  Depression screen [facility] 2/9 12/19/2020 11/10/2019 05/06/2018 05/14/2017 11/01/2015  Decreased Interest 0 0 0 0 0  Down, Depressed, Hopeless 0 0 0 0 0  PHQ - 2 Score 0 0 0 0 0  Altered sleeping 0 1 - - -  Tired, decreased energy 0 0 - - -  Change in appetite 0 0 - - -  Feeling bad or failure about yourself  0 0 - - -  Trouble concentrating 0 0 - - -  Moving slowly or fidgety/restless 0 0 - - -  Suicidal thoughts 0 0 - - -  PHQ-9 Score 0 1 - - -  Difficult doing work/chores Not difficult at all - - - -    Past Medical History:  Past Medical History:  Diagnosis Date   COVID-19 07/2018   Hyperlipidemia    Motion sickness    amuesment park rides   Osteopenia 11/01/2015   Psoriasis    Vertigo    none for over 5 yrs   Wears dentures    full upper and lower    Surgical History:  Past Surgical History:  Procedure Laterality Date   CHOLECYSTECTOMY  1990   COLONOSCOPY WITH PROPOFOL N/A 07/15/2020   Procedure: COLONOSCOPY WITH PROPOFOL;  Surgeon: Lucilla Lame, MD;  Location: [facility];  Service: Endoscopy;  Laterality: N/A;  priority 4   TUBAL LIGATION      Medications:  Current Outpatient Medications on File Prior to Visit  Medication Sig   clobetasol ointment (TEMOVATE) [id number] % Apply 1 application topically 2 (two) times daily as needed.   No current facility-administered medications on file prior to visit.    Allergies:  No Known Allergies  Social History:  Social History   Socioeconomic History   Marital status: Married     Spouse name: Elta Guadeloupe   Number of children: 1   Years of education: Not on file   Highest education level: High school graduate  Occupational History   Not on file  Tobacco Use   Smoking status: Former    Packs/day: 1.00    Years: 28.00    Pack years: 28.00    Types: Cigarettes    Quit date: 2011    Years since quitting: 11.7   Smokeless tobacco: Never  Vaping Use   Vaping Use: Former  Substance and Sexual Activity   Alcohol use: No    Alcohol/week: 0.0 standard drinks    Comment: rare   Drug use: No   Sexual activity: Yes    Birth control/protection: Surgical  Other Topics Concern   Not on file  Social History Narrative   Not on file   Social Determinants of Health   Financial Resource Strain: Not on file  Food Insecurity: Not on file  Transportation Needs: Not on file  Physical Activity: Not on file  Stress: Not on file  Social Connections: Not on file  Intimate Partner Violence: Not on  file   Social History   Tobacco Use  Smoking Status Former   Packs/day: 1.00   Years: 28.00   Pack years: 28.00   Types: Cigarettes   Quit date: 2011   Years since quitting: 11.7  Smokeless Tobacco Never   Social History   Substance and Sexual Activity  Alcohol Use No   Alcohol/week: 0.0 standard drinks   Comment: rare    Family History:  Family History  Problem Relation Age of Onset   Colon cancer Brother        70    Breast cancer Mother    Breast cancer Maternal Aunt    Pneumonia Father    Diabetes Maternal Grandmother     Past medical history, surgical history, medications, allergies, family history and social history reviewed with patient today and changes made to appropriate areas of the chart.      Objective:    BP 119/62   Pulse 83   Temp 97.8 F (36.6 C)   Resp 16   Ht '5\' 4"'$  (1.626 m)   Wt 173 lb 6.4 oz (78.7 kg)   SpO2 98%   BMI 29.76 kg/m   Wt Readings from Last 3 Encounters:  12/19/20 173 lb 6.4 oz (78.7 kg)  07/15/20 170 lb (77.1 kg)   11/10/19 169 lb 1.6 oz (76.7 kg)    Physical Exam Constitutional:      Appearance: Normal appearance.  HENT:     Head: Normocephalic.  Cardiovascular:     Rate and Rhythm: Normal rate and regular rhythm.     Heart sounds: Normal heart sounds.  Pulmonary:     Effort: Pulmonary effort is normal.     Breath sounds: Normal breath sounds.  Abdominal:     General: Bowel sounds are normal.     Palpations: Abdomen is soft.     Tenderness: There is no abdominal tenderness.  Musculoskeletal:        General: Normal range of motion.     Right lower leg: No edema.     Left lower leg: No edema.  Skin:    General: Skin is warm and dry.  Neurological:     Mental Status: She is alert and oriented to person, place, and time. Mental status is at baseline.  Psychiatric:        Mood and Affect: Mood normal.        Behavior: Behavior normal.    Results for orders placed or performed during the hospital encounter of 07/13/20  SARS CORONAVIRUS 2 (TAT 6-24 HRS) Nasopharyngeal Nasopharyngeal Swab   Specimen: Nasopharyngeal Swab  Result Value Ref Range   SARS Coronavirus 2 NEGATIVE NEGATIVE      Assessment & Plan:   Problem List Items Addressed This Visit       Musculoskeletal and Integument   Osteopenia   Relevant Orders   DG Bone Density     Other   Preventative health care   Breast cancer screening - Primary   Relevant Orders   MM DIGITAL SCREENING BILATERAL   Overweight (BMI 25.0-29.9)   Hx of tobacco use, presenting hazards to health    Recommend lung cancer screening, patient declines for now, will check with insurance to assess for coverage first.      HLD (hyperlipidemia)    LDL 190 on bloodwork received through work, ASCVD risk 3.7%. will repeat fasting labs today.      Relevant Orders   Lipid Profile   Other Visit Diagnoses  Screening for HIV (human immunodeficiency virus)            Follow up plan: Return in about 1 year (around 12/19/2021) for  cpe.   LABORATORY TESTING:  - Pap smear: up to date  IMMUNIZATIONS:   - Tdap: Tetanus vaccination status reviewed: last tetanus booster within 10 years. - Influenza: Refused - Pneumococcal: Not applicable - HPV: Not applicable - Shingrix vaccine:  due - COVID vaccine: has received 2 doses of mRNA vaccine  SCREENING: -Mammogram: Ordered today  - Colonoscopy: Up to date  - Bone Density: Ordered today  - Lung Cancer Screening: Refused, will check with insurance   Hep C Screening: UTD STD testing and prevention (HIV/chl/gon/syphilis): n/a Menstrual History/LMP/Abnormal Bleeding: post-menopausal  Osteoporosis: Discussed high calcium and vitamin D supplementation, weight bearing exercises  Advanced Care Planning: A voluntary discussion about advance care planning including the explanation and discussion of advance directives.  Discussed health care proxy and Living will, and the patient was able to identify a health care proxy as husband.  Patient does not have a living will at present time. If patient does have living will, I have requested they bring this to the clinic to be scanned in to their chart.  PATIENT COUNSELING:   Advised to take 1 mg of folate supplement per day if capable of pregnancy.   Sexuality: Discussed sexually transmitted diseases, partner selection, use of condoms, avoidance of unintended pregnancy  and contraceptive alternatives.   Advised to avoid cigarette smoking.  I discussed with the patient that most people either abstain from alcohol or drink within safe limits (<=14/week and <=4 drinks/occasion for males, <=7/weeks and <= 3 drinks/occasion for females) and that the risk for alcohol disorders and other health effects rises proportionally with the number of drinks per week and how often a drinker exceeds daily limits.  Discussed cessation/primary prevention of drug use and availability of treatment for abuse.   Diet: Encouraged to adjust caloric intake to  maintain  or achieve ideal body weight, to reduce intake of dietary saturated fat and total fat, to limit sodium intake by avoiding high sodium foods and not adding table salt, and to maintain adequate dietary potassium and calcium preferably from fresh fruits, vegetables, and low-fat dairy products.    stressed the importance of regular exercise  Injury prevention: Discussed safety belts, safety helmets, smoke detector, smoking near bedding or upholstery.   Dental health: Discussed importance of regular tooth brushing, flossing, and dental visits.    NEXT PREVENTATIVE PHYSICAL DUE IN 1 YEAR. Return in about 1 year (around 12/19/2021) for cpe.

## 2020-12-19 NOTE — Assessment & Plan Note (Signed)
Recommend lung cancer screening, patient declines for now, will check with insurance to assess for coverage first.

## 2020-12-19 NOTE — Patient Instructions (Addendum)
It was great to see you!  Our plans for today: - Check with your insurance about lung cancer screening and hemoglobin A1c.  - Check with the pharmacy about your Shingles vaccine. - Call to schedule your mammogram and bone density scan.  - We are rechecking your cholesterol, we will release these results to your MyChart.  Take care and seek immediate care sooner if you develop any concerns.   Dr. Ky Barban

## 2020-12-27 ENCOUNTER — Ambulatory Visit: Payer: BC Managed Care – PPO | Admitting: Adult Health

## 2021-01-16 ENCOUNTER — Ambulatory Visit: Payer: BC Managed Care – PPO | Admitting: Adult Health

## 2021-03-23 ENCOUNTER — Telehealth: Payer: Self-pay

## 2021-03-23 ENCOUNTER — Other Ambulatory Visit: Payer: Self-pay | Admitting: Family Medicine

## 2021-03-23 DIAGNOSIS — Z1231 Encounter for screening mammogram for malignant neoplasm of breast: Secondary | ICD-10-CM

## 2021-03-23 NOTE — Telephone Encounter (Signed)
Could you please order the mammogram for her Dr.Rumball, I see on your last visit with her you noted you had ordered it but on my behalf I do not see it.

## 2021-03-23 NOTE — Telephone Encounter (Signed)
Copied from [place] [id number]. Topic: General - Other >> Mar 23, 2021  2:12 PM Pawlus, Brayton Layman A wrote: Reason for CRM: Pt called in regarding having a mammogram done, last provider pt saw was Dr Ky Barban who is not currently at [facility], pt wanted to know if she needs to see Delsa Grana or if an order for her to get a mammogram can be sent in, please advise.

## 2021-03-26 ENCOUNTER — Other Ambulatory Visit: Payer: Self-pay | Admitting: Family Medicine

## 2021-03-26 DIAGNOSIS — Z1231 Encounter for screening mammogram for malignant neoplasm of breast: Secondary | ICD-10-CM

## 2021-03-27 NOTE — Telephone Encounter (Signed)
Pt was informed and phone number given to get her scheduled.

## 2021-03-29 NOTE — Addendum Note (Signed)
Addended by: Salomon Fick on: 03/29/2021 11:56 AM   Modules accepted: Orders

## 2021-03-29 NOTE — Telephone Encounter (Signed)
Patient was informed by [facility] they will not accept mamo orders from Dr.Rumball because she is no longer a provider at [facility] and requesting a new mamo order. Patient states she was last seen by Dr. Ky Barban on 12/19/2020 and does not want to come back in the office. Patient would like a follow up call

## 2021-03-29 NOTE — Telephone Encounter (Signed)
Pt was informed order has been put in for her mammogram.

## 2021-05-16 ENCOUNTER — Other Ambulatory Visit: Payer: Self-pay

## 2021-05-16 ENCOUNTER — Ambulatory Visit
Admission: RE | Admit: 2021-05-16 | Discharge: 2021-05-16 | Disposition: A | Discharge status: Home / Self Care | Payer: BC Managed Care – PPO | Source: Ambulatory Visit | Attending: Physician Assistant | Admitting: Physician Assistant

## 2021-05-16 DIAGNOSIS — Z1231 Encounter for screening mammogram for malignant neoplasm of breast: Secondary | ICD-10-CM | POA: Insufficient documentation

## 2021-07-11 ENCOUNTER — Ambulatory Visit: Payer: BC Managed Care – PPO | Admitting: Family Medicine

## 2021-07-11 ENCOUNTER — Encounter: Payer: Self-pay | Admitting: Family Medicine

## 2021-07-11 VITALS — BP 132/78 | HR 89 | Temp 97.7°F | Resp 16 | Ht 64.0 in | Wt 174.0 lb

## 2021-07-11 DIAGNOSIS — R030 Elevated blood-pressure reading, without diagnosis of hypertension: Secondary | ICD-10-CM

## 2021-07-11 DIAGNOSIS — E663 Overweight: Secondary | ICD-10-CM

## 2021-07-11 DIAGNOSIS — E785 Hyperlipidemia, unspecified: Secondary | ICD-10-CM | POA: Diagnosis not present

## 2021-07-11 DIAGNOSIS — Z23 Encounter for immunization: Secondary | ICD-10-CM

## 2021-07-11 NOTE — Patient Instructions (Addendum)

## 2021-07-11 NOTE — Progress Notes (Signed)
? ?Name: Tanya Howell   MRN: [id number]    DOB: [birth year]   Date:07/11/2021 ? ?     Progress Note ? ?Chief Complaint  ?Patient presents with  ? Headache  ?  Pt has notice elevated BP readings above 150 at home randomly. Pt is concerned since has not been diagnosed with HTN. Also notice some forehead pressure when readings are high.  ? ? ?Subjective:  ? ?Tanya Howell is a 61 y.o. female, presents to clinic for elevated BP ? ?BP - higher than last OV, higher readings at home ?No past DX or medications ?BP Readings from Last 3 Encounters:  ?07/11/21 132/78  ?12/19/20 119/62  ?07/15/20 125/77  ?Pt denies CP, SOB, exertional sx, LE edema, palpitation, Ha's, visual disturbances, lightheadedness, hypotension, syncope. ?No past HTN ?Past tachycardia issues ?Less active than usual - tends to occur in winter and spring summer she gets more active  ? ? ? ?No significant weight changes  ?Wt Readings from Last 5 Encounters:  ?07/11/21 174 lb (78.9 kg)  ?12/19/20 173 lb 6.4 oz (78.7 kg)  ?07/15/20 170 lb (77.1 kg)  ?11/10/19 169 lb 1.6 oz (76.7 kg)  ?05/06/18 170 lb 14.4 oz (77.5 kg)  ? ?BMI Readings from Last 5 Encounters:  ?07/11/21 29.87 kg/m?  ?12/19/20 29.76 kg/m?  ?07/15/20 29.18 kg/m?  ?11/10/19 29.03 kg/m?  ?05/06/18 29.33 kg/m?  ? ? ? ? ?Current Outpatient Medications:  ?  clobetasol ointment (TEMOVATE) 7.82 %, Apply 1 application topically 2 (two) times daily as needed., Disp: 30 g, Rfl: 3 ?  meclizine (ANTIVERT) 25 MG tablet, Take 1 tablet (25 mg total) by mouth 3 (three) times daily as needed for dizziness., Disp: 30 tablet, Rfl: 3 ? ?Patient Active Problem List  ? Diagnosis Date Noted  ? HLD (hyperlipidemia) 12/19/2020  ? Personal history of colonic polyps   ? Hx of tobacco use, presenting hazards to health 02/24/2016  ? Psoriasis 12/03/2015  ? Overweight (BMI 25.0-29.9) 12/03/2015  ? Vertigo 12/03/2015  ? 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 Preventative health care 11/01/2015  ? Breast cancer screening  11/01/2015  ? Osteopenia 11/01/2015  ? Pap smear for cervical cancer screening 11/01/2015  ? Abnormal appearance of cervix 11/01/2015  ? ? ?Past Surgical History:  ?Procedure Laterality Date  ? CHOLECYSTECTOMY  1990  ? COLONOSCOPY WITH PROPOFOL N/A 07/15/2020  ? Procedure: COLONOSCOPY WITH PROPOFOL;  Surgeon: Lucilla Lame, MD;  Location: [place];  Service: Endoscopy;  Laterality: N/A;  priority 4  ? TUBAL LIGATION    ? ? ?Family History  ?Problem Relation Age of Onset  ? Colon cancer Brother   ?     7   ? Breast cancer Mother   ? Breast cancer Maternal Aunt   ? Pneumonia Father   ? Diabetes Maternal Grandmother   ? ? ?Social History  ? ?Tobacco Use  ? Smoking status: Former  ?  Packs/day: 1.00  ?  Years: 28.00  ?  Pack years: 28.00  ?  Types: Cigarettes  ?  Quit date: 2011  ?  Years since quitting: 12.2  ? Smokeless tobacco: Never  ?Vaping Use  ? Vaping Use: Former  ?Substance Use Topics  ? Alcohol use: No  ?  Alcohol/week: 0.0 standard drinks  ?  Comment: rare  ? Drug use: No  ?  ? ?No Known Allergies ? ?Health Maintenance  ?Topic Date Due  ? COVID-19 Vaccine (3 - Booster for Moderna series) 07/27/2021 (Originally 12/15/2019)  ?  Zoster Vaccines- Shingrix (1 of 2) 10/10/2021 (Originally 02/18/2011)  ? INFLUENZA VACCINE  11/07/2021  ? MAMMOGRAM  05/16/2022  ? PAP SMEAR-Modifier  11/09/2024  ? COLONOSCOPY (Pts 45-33yr Insurance coverage will need to be confirmed)  07/16/2027  ? TETANUS/TDAP  11/03/2028  ? Hepatitis C Screening  Completed  ? HIV Screening  Completed  ? HPV VACCINES  Aged Out  ? ? ?Chart Review Today: ?I personally reviewed active problem list, medication list, allergies, family history, social history, health maintenance, notes from last encounter, lab results, imaging with the patient/caregiver today. ? ? ?Review of Systems  ?Constitutional: Negative.   ?HENT: Negative.    ?Eyes: Negative.   ?Respiratory: Negative.    ?Cardiovascular: Negative.   ?Gastrointestinal: Negative.   ?Endocrine:  Negative.   ?Genitourinary: Negative.   ?Musculoskeletal: Negative.   ?Skin: Negative.   ?Allergic/Immunologic: Negative.   ?Neurological: Negative.   ?Hematological: Negative.   ?Psychiatric/Behavioral: Negative.    ?All other systems reviewed and are negative.  ? ?Objective:  ? ?Vitals:  ? 07/11/21 1337  ?BP: 132/78  ?Pulse: 89  ?Resp: 16  ?Temp: 97.7 ?F (36.5 ?C)  ?TempSrc: Oral  ?SpO2: 98%  ?Weight: 174 lb (78.9 kg)  ?Height: '5\' 4"'$  (1.626 m)  ? ? ?Body mass index is 29.87 kg/m?. ? ?Physical Exam ?Vitals and nursing note reviewed.  ?Constitutional:   ?   General: She is not in acute distress. ?   Appearance: Normal appearance. She is well-developed. She is not ill-appearing, toxic-appearing or diaphoretic.  ?   Interventions: Face mask in place.  ?HENT:  ?   Head: Normocephalic and atraumatic.  ?   Right Ear: External ear normal.  ?   Left Ear: External ear normal.  ?   Nose: Nose normal.  ?   Mouth/Throat:  ?   Mouth: Mucous membranes are moist.  ?   Pharynx: Oropharynx is clear. No oropharyngeal exudate or posterior oropharyngeal erythema.  ?Eyes:  ?   General: Lids are normal. No scleral icterus.    ?   Right eye: No discharge.     ?   Left eye: No discharge.  ?   Extraocular Movements: Extraocular movements intact.  ?   Conjunctiva/sclera: Conjunctivae normal.  ?   Pupils: Pupils are equal, round, and reactive to light.  ?Neck:  ?   Trachea: Phonation normal. No tracheal deviation.  ?Cardiovascular:  ?   Rate and Rhythm: Normal rate and regular rhythm.  ?   Pulses: Normal pulses.     ?     Radial pulses are 2+ on the right side and 2+ on the left side.  ?     Posterior tibial pulses are 2+ on the right side and 2+ on the left side.  ?   Heart sounds: Normal heart sounds. No murmur heard. ?  No friction rub. No gallop.  ?Pulmonary:  ?   Effort: Pulmonary effort is normal. No respiratory distress.  ?   Breath sounds: Normal breath sounds. No stridor. No wheezing, rhonchi or rales.  ?Chest:  ?   Chest wall: No  tenderness.  ?Abdominal:  ?   General: Bowel sounds are normal. There is no distension.  ?   Palpations: Abdomen is soft.  ?Musculoskeletal:  ?   Right lower leg: No edema.  ?   Left lower leg: No edema.  ?Skin: ?   General: Skin is warm and dry.  ?   Coloration: Skin is not jaundiced or pale.  ?  Findings: No rash.  ?Neurological:  ?   General: No focal deficit present.  ?   Mental Status: She is alert.  ?   Cranial Nerves: Cranial nerves 2-12 are intact.  ?   Sensory: Sensation is intact.  ?   Motor: Motor function is intact. No abnormal muscle tone.  ?   Coordination: Coordination is intact.  ?   Gait: Gait is intact. Gait normal.  ?Psychiatric:     ?   Mood and Affect: Mood normal.     ?   Speech: Speech normal.     ?   Behavior: Behavior normal.  ?  ? ? ? ? ?Assessment & Plan:  ? ?1. Elevated BP without diagnosis of hypertension ?BP borderline here today ?Overall her intermittent headache I do not find concerning nor related to her blood pressure ?She is going to work on [place], increase physical activity, and monitor her BP at home and return with readings. ?At this time we will not start blood pressure medication but if her average is over 130 we discussed how a blood pressure medication would be helpful ?I do not know if there have been some weight, diet changes, increase stressors in her life or she may be having some chronic changes with her blood vessels due to long term hyperlipidemia?  We will check her kidney function today ?Patient can send in readings from home or follow-up in clinic for BP recheck, if uncontrolled she will need an encounter to start the medications and discuss ?- COMPLETE METABOLIC PANEL WITH GFR ?With her mild headache complaint I did a neurological evaluation and there was no focal neurological deficit ? ?2. Hyperlipidemia, unspecified hyperlipidemia type ? ?Reviewed patient's last lipids, encouraged diet lifestyle changes, she will work on this and we will recheck labs at her  follow-up appointment or annually at her CPE if she wishes ?Currently ASCVD risk is 4.3% and a statin medication is not indicated ?The 10-year ASCVD risk score (Arnett DK, et al., 2019) is: 4.3% ?  Values used to

## 2021-08-10 ENCOUNTER — Encounter: Payer: Self-pay | Admitting: Family Medicine

## 2021-08-10 ENCOUNTER — Telehealth (INDEPENDENT_AMBULATORY_CARE_PROVIDER_SITE_OTHER): Payer: BC Managed Care – PPO | Admitting: Family Medicine

## 2021-08-10 VITALS — Ht 64.0 in | Wt 170.0 lb

## 2021-08-10 DIAGNOSIS — E785 Hyperlipidemia, unspecified: Secondary | ICD-10-CM

## 2021-08-10 DIAGNOSIS — I10 Essential (primary) hypertension: Secondary | ICD-10-CM | POA: Diagnosis not present

## 2021-08-10 MED ORDER — LISINOPRIL 10 MG PO TABS: 10.0000 mg | ORAL_TABLET | Freq: Every day | ORAL | 3 refills | Status: DC

## 2021-08-10 MED ORDER — ATORVASTATIN CALCIUM 10 MG PO TABS: 10.0000 mg | ORAL_TABLET | Freq: Every day | ORAL | 3 refills | Status: DC

## 2021-08-10 NOTE — Progress Notes (Signed)
? ?Name: Tanya Howell   MRN: [id number]    DOB: 29-Sep-1960   Date:08/10/2021 ? ?     Progress Note ? ?Subjective:  ? ? ?Chief Complaint ? ?Chief Complaint  ?Patient presents with  ? Follow-up  ? ? ?I connected with  Tanya Howell  on 08/10/21 at  9:20 AM EDT by a video enabled telemedicine application and verified that I am speaking with the correct person using two identifiers.  I discussed the limitations of evaluation and management by telemedicine and the availability of in person appointments. The patient expressed understanding and agreed to proceed. Staff also discussed with the patient that there may be a patient responsible charge related to this service. ?Patient Location:  home ?Provider Location: [facility] clinic office ?Additional Individuals present: none ? ?HPI ?Presents for BP f/up - does not know what her BP has been, she can't find her cuff - shortly after visit one month ago she recalls monitoring BP at home for a bit with getting 130-140's/78-82 ? ?Life screen labs done - she relays a few abnormal labs - will send in the rest through mychart ?Total cholesterol 234 ?LDL 162 ?She states BUN was elevated ? ?No significant diet/lifestyle efforts and low sodium attempted in the past month since OV ? ? ? ? ?Patient Active Problem List  ? Diagnosis Date Noted  ? HLD (hyperlipidemia) 12/19/2020  ? Personal history of colonic polyps   ? Hx of tobacco use, presenting hazards to health 02/24/2016  ? Psoriasis 12/03/2015  ? Overweight (BMI 25.0-29.9) 12/03/2015  ? Vertigo 12/03/2015  ? 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 Preventative health care 11/01/2015  ? Breast cancer screening 11/01/2015  ? Osteopenia 11/01/2015  ? Pap smear for cervical cancer screening 11/01/2015  ? Abnormal appearance of cervix 11/01/2015  ? ? ?Social History  ? ?Tobacco Use  ? Smoking status: Former  ?  Packs/day: 1.00  ?  Years: 28.00  ?  Pack years: 28.00  ?  Types: Cigarettes  ?  Quit date: 2011  ?  Years since quitting: 12.3  ?  Smokeless tobacco: Never  ?Substance Use Topics  ? Alcohol use: No  ?  Alcohol/week: 0.0 standard drinks  ?  Comment: rare  ? ? ? ?Current Outpatient Medications:  ?  clobetasol ointment (TEMOVATE) 7.10 %, Apply 1 application topically 2 (two) times daily as needed., Disp: 30 g, Rfl: 3 ?  meclizine (ANTIVERT) 25 MG tablet, Take 1 tablet (25 mg total) by mouth 3 (three) times daily as needed for dizziness., Disp: 30 tablet, Rfl: 3 ? ?No Known Allergies ? ?I personally reviewed active problem list, medication list, allergies, family history, social history, health maintenance, notes from last encounter, lab results, imaging with the patient/caregiver today. ? ? ?Review of Systems  ?Constitutional: Negative.  Negative for unexpected weight change.  ?HENT: Negative.    ?Eyes: Negative.   ?Respiratory: Negative.    ?Cardiovascular: Negative.  Negative for chest pain and palpitations.  ?Gastrointestinal: Negative.   ?Endocrine: Negative.   ?Genitourinary: Negative.   ?Musculoskeletal: Negative.   ?Skin: Negative.   ?Allergic/Immunologic: Negative.   ?Neurological: Negative.   ?Hematological: Negative.   ?Psychiatric/Behavioral: Negative.    ?All other systems reviewed and are negative. ? ? ?Objective:  ? ?Virtual encounter, vitals limited, only able to obtain the following ?Today's Vitals  ? 08/10/21 0913  ?Weight: 170 lb (77.1 kg)  ?Height: '5\' 4"'$  (1.626 m)  ? ?Body mass index is 29.18 kg/m?Marland Kitchen ?Nursing Note and Vital  Signs reviewed. ? ?Physical Exam ?Vitals and nursing note reviewed.  ?Constitutional:   ?   Appearance: Normal appearance. She is obese.  ?HENT:  ?   Head: Normocephalic and atraumatic.  ?Neurological:  ?   Mental Status: She is alert.  ?Psychiatric:     ?   Mood and Affect: Mood normal.  ? ? ?PE limited by virtual encounter ? ?No results found for this or any previous visit (from the past 72 hour(s)). ? ?Assessment and Plan:  ? ?1. Hyperlipidemia, unspecified hyperlipidemia type ?LDL over 160 - discussed  indications to start statin ?Will need to get full labs from pt to recalculate ASCVD risk - this may continue to be low, but with LDL repeatedly over 160 and increasing BP I do believe that a statin is indicated and will be beneficial. ?Advised also working on diet/lifestyle efforts as well ?- atorvastatin (LIPITOR) 10 MG tablet; Take 1 tablet (10 mg total) by mouth at bedtime.  Dispense: 90 tablet; Refill: 3 ?- COMPLETE METABOLIC PANEL WITH GFR ?- Lipid panel ? ?2. Primary hypertension ?Still need to get labs ?Can start low dose lisinopril, DASH ?3 month F/up ?- lisinopril (ZESTRIL) 10 MG tablet; Take 1 tablet (10 mg total) by mouth daily.  Dispense: 90 tablet; Refill: 3 ?- COMPLETE METABOLIC PANEL WITH GFR ?  ? ?-Red flags and when to present for emergency care or RTC including fever >101.24F, chest pain, shortness of breath, new/worsening/un-resolving symptoms, reviewed with patient at time of visit. Follow up and care instructions discussed and provided in AVS. ?- I discussed the assessment and treatment plan with the patient. The patient was provided an opportunity to ask questions and all were answered. The patient agreed with the plan and demonstrated an understanding of the instructions. ? ?I provided 22+ minutes of non-face-to-face time during this encounter. ? ?Delsa Grana, PA-C ?08/10/21 9:22 AM ? ? ? ?

## 2021-10-11 DIAGNOSIS — N23 Unspecified renal colic: Secondary | ICD-10-CM | POA: Diagnosis not present

## 2021-10-11 DIAGNOSIS — R109 Unspecified abdominal pain: Secondary | ICD-10-CM | POA: Diagnosis not present

## 2021-10-12 ENCOUNTER — Other Ambulatory Visit: Payer: Self-pay

## 2021-10-12 ENCOUNTER — Emergency Department
Admission: EM | Admit: 2021-10-12 | Discharge: 2021-10-12 | Disposition: A | Discharge status: Home / Self Care | Payer: BC Managed Care – PPO | Attending: Emergency Medicine | Admitting: Emergency Medicine

## 2021-10-12 DIAGNOSIS — R109 Unspecified abdominal pain: Secondary | ICD-10-CM

## 2021-10-12 DIAGNOSIS — N23 Unspecified renal colic: Secondary | ICD-10-CM

## 2021-10-12 LAB — COMPREHENSIVE METABOLIC PANEL
ALT: 28 U/L (ref 0–44)
AST: 21 U/L (ref 15–41)
Albumin: 4.7 g/dL (ref 3.5–5.0)
Alkaline Phosphatase: 65 U/L (ref 38–126)
Anion gap: 9 (ref 5–15)
BUN: 16 mg/dL (ref 6–20)
CO2: 26 mmol/L (ref 22–32)
Calcium: 9.7 mg/dL (ref 8.9–10.3)
Chloride: 103 mmol/L (ref 98–111)
Creatinine, Ser: 0.97 mg/dL (ref 0.44–1.00)
GFR, Estimated: >60 mL/min (ref >60)
Glucose, Bld: 113 mg/dL — ABNORMAL HIGH (ref 70–99)
Potassium: 3.5 mmol/L (ref 3.5–5.1)
Sodium: 138 mmol/L (ref 135–145)
Total Bilirubin: 0.7 mg/dL (ref 0.3–1.2)
Total Protein: 7.4 g/dL (ref 6.5–8.1)

## 2021-10-12 LAB — CBC
HCT: 40.6 % (ref 36.0–46.0)
Hemoglobin: 13.3 g/dL (ref 12.0–15.0)
MCH: 27.8 pg (ref 26.0–34.0)
MCHC: 32.8 g/dL (ref 30.0–36.0)
MCV: 84.8 fL (ref 80.0–100.0)
Platelets: 287 10*3/uL (ref 150–400)
RBC: 4.79 MIL/uL (ref 3.87–5.11)
RDW: 12.9 % (ref 11.5–15.5)
WBC: 9.1 10*3/uL (ref 4.0–10.5)
nRBC: 0 % (ref 0.0–0.2)

## 2021-10-12 LAB — URINALYSIS, ROUTINE W REFLEX MICROSCOPIC
Bilirubin Urine: NEGATIVE
Glucose, UA: NEGATIVE mg/dL
Ketones, ur: NEGATIVE mg/dL
Leukocytes,Ua: NEGATIVE
Nitrite: NEGATIVE
Protein, ur: NEGATIVE mg/dL
Specific Gravity, Urine: 1.017 (ref 1.005–1.030)
pH: 6 (ref 5.0–8.0)

## 2021-10-12 NOTE — ED Triage Notes (Signed)
Pt in with co right flank pain that started tonight, no hx of the same.

## 2021-10-12 NOTE — ED Provider Notes (Signed)
[facility] Provider Note    Event Date/Time   First MD Initiated Contact with Patient 10/12/21 0208     (approximate)   History   Flank Pain   HPI  Tanya Howell is a 61 y.o. female who presents to the ED for evaluation of Flank Pain   I reviewed PCP visit from 4/4.  Intermittent elevated blood pressure at home without diagnosis of hypertension, or any medications.  Patient self-reports a family history of kidney stones.  She has no personal history of this though.  She reports this evening around 11 PM she had rather sudden onset of severe right flank pain that she has never felt before.  They began driving to the hospital, the pain migrated down towards her right groin and then resolved spontaneously prior to arrival.  Reports the pain resolved just as she was checking in and she feels much better now.  She waited for about 3 hours prior to being roomed in my evaluation and throughout this time has not had any worsening of her pain, recurrence of the pain, emesis.  Reports she feels fine right now.   Physical Exam   Triage Vital Signs: ED Triage Vitals [10/12/21 0011]  Enc Vitals Group     BP (!) 191/90     Pulse Rate 91     Resp 18     Temp 98.3 F (36.8 C)     Temp Source Oral     SpO2 98 %     Weight 168 lb (76.2 kg)     Height '5\' 4"'$  (1.626 m)     Head Circumference      Peak Flow      Pain Score 4     Pain Loc      Pain Edu?      Excl. in [place]?     Most recent vital signs: Vitals:   10/12/21 0011 10/12/21 0201  BP: (!) 191/90 (!) 141/81  Pulse: 91 82  Resp: 18 18  Temp: 98.3 F (36.8 C) 98.2 F (36.8 C)  SpO2: 98% 99%    General: Awake, no distress.  CV:  Good peripheral perfusion.  Resp:  Normal effort.  Abd:  No distention.  Soft and benign MSK:  No deformity noted.  No signs of trauma to the flank, skin changes or tenderness.  No CVA tenderness bilaterally. Neuro:  No focal deficits appreciated. Other:     ED  Results / Procedures / Treatments   Labs (all labs ordered are listed, but only abnormal results are displayed) Labs Reviewed  COMPREHENSIVE METABOLIC PANEL - Abnormal; Notable for the following components:      Result Value   Glucose, Bld 113 (*)    All other components within normal limits  URINALYSIS, ROUTINE W REFLEX MICROSCOPIC - Abnormal; Notable for the following components:   Color, Urine YELLOW (*)    APPearance HAZY (*)    Hgb urine dipstick SMALL (*)    Bacteria, UA RARE (*)    All other components within normal limits  CBC    EKG   RADIOLOGY   Official radiology report(s): No results found.  PROCEDURES and INTERVENTIONS:  Procedures  Medications - No data to display   IMPRESSION / MDM / [place] / ED COURSE  I reviewed the triage vital signs and the nursing notes.  Differential diagnosis includes, but is not limited to, ureteral colic, aortic dissection, ACS, muscular spasm  {Patient presents with symptoms  of an acute illness or injury that is potentially life-threatening.  61 year old female presents to the ED with resolving right flank pain, likely ureteral colic but ultimately suitable for outpatient management.  She looks systemically well and is asymptomatic when I evaluate the patient with a normal examination.  Blood work is reassuring with normal CBC and metabolic panel.  Urine without infectious features.  Small hemoglobin.  Her symptomatology is most consistent with ureteral colic.  Discussed the possibility of more severe derangement such as aortic pathology, but her history and examination are not suggestive of this.  We discussed possibly doing a CTA to evaluate for this, but ultimately decided upon outpatient management with close return precautions.      FINAL CLINICAL IMPRESSION(S) / ED DIAGNOSES   Final diagnoses:  Right flank pain  Ureteral colic     Rx / DC Orders   ED Discharge Orders     None        Note:   This document was prepared using Dragon voice recognition software and may include unintentional dictation errors.   Vladimir Crofts, MD 10/12/21 [phone]

## 2021-10-12 NOTE — Discharge Instructions (Addendum)
I suspect you passed a kidney stone.  If your pain gets worse then please return to the ED.

## 2021-11-14 ENCOUNTER — Ambulatory Visit: Payer: BC Managed Care – PPO | Admitting: Family Medicine

## 2022-02-05 ENCOUNTER — Other Ambulatory Visit: Payer: Self-pay | Admitting: Family Medicine

## 2022-02-06 NOTE — Telephone Encounter (Signed)
Requested medication (s) are due for refill today expired Rx  Requested medication (s) are on the active medication list -yes  Future visit scheduled -no  Last refill: 12/28/20 #30 3RF  Notes to clinic: expired Rx, non delegated Rx  Requested Prescriptions  Pending Prescriptions Disp Refills   meclizine (ANTIVERT) 25 MG tablet [Pharmacy Med Name: Meclizine HCl 25 MG Oral Tablet] 30 tablet 0    Sig: TAKE 1 TABLET BY MOUTH THREE TIMES DAILY AS NEEDED FOR DIZZINESS     Not Delegated - Gastroenterology: Antiemetics Failed - 02/05/2022 11:11 AM      Failed - This refill cannot be delegated      Passed - Valid encounter within last 6 months    Recent Outpatient Visits           6 months ago Hyperlipidemia, unspecified hyperlipidemia type   [facility] Delsa Grana, PA-C   7 months ago Elevated BP without diagnosis of hypertension   [facility] Delsa Grana, PA-C   1 year ago Encounter for screening mammogram for malignant neoplasm of breast   [place], DO   2 years ago Annual physical exam   [facility] Delsa Grana, PA-C   3 years ago Upper respiratory tract infection, unspecified type   [place], NP                 Requested Prescriptions  Pending Prescriptions Disp Refills   meclizine (ANTIVERT) 25 MG tablet [Pharmacy Med Name: Meclizine HCl 25 MG Oral Tablet] 30 tablet 0    Sig: TAKE 1 TABLET BY MOUTH THREE TIMES DAILY AS NEEDED FOR DIZZINESS     Not Delegated - Gastroenterology: Antiemetics Failed - 02/05/2022 11:11 AM      Failed - This refill cannot be delegated      Passed - Valid encounter within last 6 months    Recent Outpatient Visits           6 months ago Hyperlipidemia, unspecified hyperlipidemia type   [facility] Delsa Grana, PA-C   7 months ago Elevated BP without diagnosis of  hypertension   [facility] Delsa Grana, PA-C   1 year ago Encounter for screening mammogram for malignant neoplasm of breast   [facility] Myles Gip, DO   2 years ago Annual physical exam   [facility] Delsa Grana, PA-C   3 years ago Upper respiratory tract infection, unspecified type   [place], NP

## 2022-05-14 ENCOUNTER — Ambulatory Visit: Payer: BC Managed Care – PPO | Admitting: Nurse Practitioner

## 2022-08-17 ENCOUNTER — Other Ambulatory Visit: Payer: Self-pay | Admitting: Family Medicine

## 2022-08-17 DIAGNOSIS — E785 Hyperlipidemia, unspecified: Secondary | ICD-10-CM

## 2022-11-13 ENCOUNTER — Encounter: Payer: Self-pay | Admitting: Family Medicine

## 2022-11-13 ENCOUNTER — Ambulatory Visit: Payer: No Typology Code available for payment source | Admitting: Family Medicine

## 2022-11-13 VITALS — BP 137/85 | HR 93 | Temp 98.4°F | Ht 64.96 in | Wt 172.8 lb

## 2022-11-13 DIAGNOSIS — J069 Acute upper respiratory infection, unspecified: Secondary | ICD-10-CM

## 2022-11-13 DIAGNOSIS — E785 Hyperlipidemia, unspecified: Secondary | ICD-10-CM

## 2022-11-13 DIAGNOSIS — I10 Essential (primary) hypertension: Secondary | ICD-10-CM | POA: Insufficient documentation

## 2022-11-13 DIAGNOSIS — Z114 Encounter for screening for human immunodeficiency virus [HIV]: Secondary | ICD-10-CM

## 2022-11-13 DIAGNOSIS — Z1231 Encounter for screening mammogram for malignant neoplasm of breast: Secondary | ICD-10-CM

## 2022-11-13 LAB — URINALYSIS, ROUTINE W REFLEX MICROSCOPIC
Bilirubin, UA: NEGATIVE
Glucose, UA: NEGATIVE
Ketones, UA: NEGATIVE
Leukocytes,UA: NEGATIVE
Nitrite, UA: NEGATIVE
Specific Gravity, UA: >1.03 — ABNORMAL HIGH (ref 1.005–1.030)
Urobilinogen, Ur: 0.2 mg/dL (ref 0.2–1.0)
pH, UA: 5.5 (ref 5.0–7.5)

## 2022-11-13 LAB — RAPID STREP SCREEN (MED CTR MEBANE ONLY): Strep Gp A Ag, IA W/Reflex: NEGATIVE

## 2022-11-13 LAB — MICROSCOPIC EXAMINATION: Bacteria, UA: NONE SEEN

## 2022-11-13 LAB — MICROALBUMIN, URINE WAIVED
Creatinine, Urine Waived: 300 mg/dL (ref 10–300)
Microalb, Ur Waived: 80 mg/L — ABNORMAL HIGH (ref 0–19)

## 2022-11-13 LAB — CULTURE, GROUP A STREP

## 2022-11-13 MED ORDER — MECLIZINE HCL 25 MG PO TABS: 25.0000 mg | ORAL_TABLET | Freq: Three times a day (TID) | ORAL | 3 refills | Status: AC | PRN

## 2022-11-13 MED ORDER — ATORVASTATIN CALCIUM 10 MG PO TABS: 10.0000 mg | ORAL_TABLET | Freq: Every day | ORAL | 1 refills | Status: DC

## 2022-11-13 MED ORDER — LISINOPRIL 5 MG PO TABS: 5.0000 mg | ORAL_TABLET | Freq: Every day | ORAL | 1 refills | Status: DC

## 2022-11-13 NOTE — Patient Instructions (Signed)
Your mammogram is scheduled for 11/22/22 at 2:40 PM at [facility] at [facility].  Address: [address], [place], [place] [address] Phone: [phone]

## 2022-11-13 NOTE — Progress Notes (Signed)
BP 137/85   Pulse 93   Temp 98.4 F (36.9 C) (Oral)   Ht 5' 4.96" (1.65 m)   Wt 172 lb 12.8 oz (78.4 kg)   SpO2 98%   BMI 28.79 kg/m    Subjective:    Patient ID: Tanya Howell, female    DOB: 1961/03/03, 62 y.o.   MRN: [id number]  HPI: Tanya Howell is a 62 y.o. female  Chief Complaint  Patient presents with   Hypertension   Hypothyroidism   Sore Throat    For the last 2 days    UPPER RESPIRATORY TRACT INFECTION Duration: 2 days Worst symptom: sore throat and body aches Fever: yes Cough: yes Shortness of breath: no Wheezing: no Chest pain: no Chest tightness: no Chest congestion: no Nasal congestion: yes Runny nose: yes Post nasal drip: yes Sneezing: yes Sore throat: yes Swollen glands: no Sinus pressure: yes Headache: yes Face pain: no Toothache: no Ear pain: no  Ear pressure: no  Eyes red/itching:no Eye drainage/crusting: no  Vomiting: no Rash: no Fatigue: yes Sick contacts: no Strep contacts: no  Context: better Recurrent sinusitis: no Relief with OTC cold/cough medications: no  Treatments attempted: none  HYPERTENSION / HYPERLIPIDEMIA Satisfied with current treatment? 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   Psoriasis 12/03/2015   Vertigo 12/03/2015   HLD (hyperlipidemia) 12/19/2020   HTN (hypertension) 11/13/2022   Resolved Ambulatory Problems    Diagnosis Date Noted    Preventative health care 11/01/2015   Breast cancer screening 11/01/2015   Pap smear for cervical cancer screening 11/01/2015   Abnormal appearance of cervix 11/01/2015   Overweight (BMI 25.0-29.9) 12/03/2015   Hx of tobacco use, presenting hazards to health 02/24/2016   Personal history of colonic polyps    Past Medical History:  Diagnosis Date   COVID-19 07/2018   Hyperlipidemia    Motion sickness    Wears dentures    Past Surgical History:  Procedure Laterality Date   CHOLECYSTECTOMY  1990   COLONOSCOPY WITH PROPOFOL N/A 07/15/2020   Procedure: COLONOSCOPY WITH PROPOFOL;  Surgeon: Midge Minium, MD;  Location: [facility];  Service: Endoscopy;  Laterality: N/A;  priority 4   TUBAL LIGATION     Outpatient Encounter Medications as of 11/13/2022  Medication Sig Note   atorvastatin (LIPITOR) 10 MG tablet Take 1 tablet (10 mg total) by mouth at bedtime.    lisinopril (ZESTRIL) 5 MG tablet Take 1 tablet (5 mg total) by mouth daily.    meclizine (ANTIVERT) 25 MG tablet Take 1 tablet (25 mg total) by mouth 3 (three) times daily as needed for dizziness.    [DISCONTINUED] atorvastatin (LIPITOR) 10 MG tablet Take 1 tablet (10 mg total) by mouth at bedtime. (Patient not taking: Reported on 11/13/2022)    [DISCONTINUED] clobetasol ointment (TEMOVATE) 0.05 % Apply 1 application topically 2 (two) times daily as needed. (Patient not taking: Reported on 11/13/2022) 05/06/2018: PRN   [DISCONTINUED] lisinopril (ZESTRIL) 10 MG tablet Take 1 tablet (10 mg total) by mouth daily. (Patient not taking: Reported on  11/13/2022)    [DISCONTINUED] meclizine (ANTIVERT) 25 MG tablet Take 1 tablet (25 mg total) by mouth 3 (three) times daily as needed for dizziness. (Patient not taking: Reported on 11/13/2022)    No facility-administered encounter medications on file as of 11/13/2022.   No Known Allergies Social History   Socioeconomic History   Marital status: Married    Spouse name: Loraine Leriche   Number of  children: 1   Years of education: Not on file   Highest education level: High school graduate  Occupational History   Not on file  Tobacco Use   Smoking status: Former    Current packs/day: 0.00    Average packs/day: 1 pack/day for 28.0 years (28.0 ttl pk-yrs)    Types: Cigarettes    Start date: 73    Quit date: 2011    Years since quitting: 13.6   Smokeless tobacco: Never  Vaping Use   Vaping status: Former  Substance and Sexual Activity   Alcohol use: No    Alcohol/week: 0.0 standard drinks of alcohol    Comment: rare   Drug use: No   Sexual activity: Yes    Birth control/protection: Surgical  Other Topics Concern   Not on file  Social History Narrative   Not on file   Social Determinants of Health   Financial Resource Strain: Low Risk  (05/06/2018)   Overall Financial Resource Strain (CARDIA)    Difficulty of Paying Living Expenses: Not hard at all  Food Insecurity: No Food Insecurity (05/06/2018)   Hunger Vital Sign    Worried About Running Out of Food in the Last Year: Never true    Ran Out of Food in the Last Year: Never true  Transportation Needs: No Transportation Needs (11/10/2019)   PRAPARE - Administrator, Civil Service (Medical): No    Lack of Transportation (Non-Medical): No  Physical Activity: Sufficiently Active (05/06/2018)   Exercise Vital Sign    Days of Exercise per Week: 7 days    Minutes of Exercise per Session: 30 min  Stress: No Stress Concern Present (05/06/2018)   [employer] of Occupational Health - Occupational Stress Questionnaire    Feeling of Stress : Not at all  Social Connections: Somewhat Isolated (05/06/2018)   Social Connection and Isolation Panel [NHANES]    Frequency of Communication with Friends and Family: More than three times a week    Frequency of Social Gatherings with Friends and Family: Twice a week    Attends Religious Services: Never    Database administrator or Organizations: No    Attends Museum/gallery exhibitions officer: Never    Marital Status: Married   Family History  Problem Relation Age of Onset   Colon cancer Brother        55    Breast cancer Mother    Breast cancer Maternal Aunt    Pneumonia Father    Diabetes Maternal Grandmother     Review of Systems  Constitutional:  Positive for chills, diaphoresis and fever. Negative for activity change, appetite change, fatigue and unexpected weight change.  HENT:  Positive for congestion, postnasal drip, rhinorrhea, sinus pressure and sore throat. Negative for dental problem, drooling, ear discharge, ear pain, facial swelling, hearing loss, mouth sores, nosebleeds, sinus pain, sneezing, tinnitus, trouble swallowing and voice change.   Eyes: Negative.   Respiratory:  Positive for cough. Negative for apnea, choking, chest tightness, shortness of breath, wheezing and stridor.   Cardiovascular: Negative.   Gastrointestinal:  Negative.   Musculoskeletal: Negative.   Neurological: Negative.   Psychiatric/Behavioral: Negative.      Per HPI unless specifically indicated above     Objective:    BP 137/85   Pulse 93   Temp 98.4 F (36.9 C) (Oral)   Ht 5' 4.96" (1.65 m)   Wt 172 lb 12.8 oz (78.4 kg)   SpO2 98%   BMI 28.79 kg/m   Wt Readings from Last 3 Encounters:  11/13/22 172 lb 12.8 oz (78.4 kg)  10/12/21 168 lb (76.2 kg)  08/10/21 170 lb (77.1 kg)    Physical Exam Vitals and nursing note reviewed.  Constitutional:      General: She is not in acute distress.    Appearance: Normal appearance. She is not ill-appearing, toxic-appearing or diaphoretic.  HENT:     Head: Normocephalic and atraumatic.     Right Ear: Tympanic membrane, ear canal and external ear normal.     Left Ear: Tympanic membrane, ear canal and external ear normal.     Nose: Nose normal. No congestion or rhinorrhea.     Mouth/Throat:     Mouth: Mucous membranes are moist.     Pharynx: Oropharynx is clear.  Eyes:     General: No scleral icterus.        Right eye: No discharge.        Left eye: No discharge.     Extraocular Movements: Extraocular movements intact.     Conjunctiva/sclera: Conjunctivae normal.     Pupils: Pupils are equal, round, and reactive to light.  Cardiovascular:     Rate and Rhythm: Normal rate and regular rhythm.     Pulses: Normal pulses.     Heart sounds: Normal heart sounds. No murmur heard.    No friction rub. No gallop.  Pulmonary:     Effort: Pulmonary effort is normal. No respiratory distress.     Breath sounds: Normal breath sounds. No stridor. No wheezing, rhonchi or rales.  Chest:     Chest wall: No tenderness.  Musculoskeletal:        General: Normal range of motion.     Cervical back: Normal range of motion and neck supple.  Skin:    General: Skin is warm and dry.     Capillary Refill: Capillary refill takes less than 2 seconds.     Coloration: Skin is not jaundiced or pale.     Findings: No bruising, erythema, lesion or rash.  Neurological:     General: No focal deficit present.     Mental Status: She is alert and oriented to person, place, and time. Mental status is at baseline.  Psychiatric:        Mood and Affect: Mood normal.        Behavior: Behavior normal.        Thought Content: Thought content normal.        Judgment: Judgment normal.     Results for orders placed or performed in visit on 11/13/22  Rapid Strep Screen ([facility] ONLY)   Specimen: Other   Other  Result Value Ref Range   Strep Gp A Ag, IA W/Reflex Negative Negative  Novel Coronavirus, NAA ([facility])   Specimen: Nasopharyngeal(NP) swabs in vial transport medium  Result Value Ref Range   SARS-CoV-2, NAA Detected (A) Not Detected  Culture, Group A Strep   Other  Result Value Ref Range   Strep A Culture Negative   Microscopic Examination   Urine  Result Value  Ref Range   WBC, UA 0-5 0 - 5 /hpf   RBC, Urine 0-2 0 - 2 /hpf   Epithelial Cells (non renal) 0-10 0 - 10 /hpf   Mucus, UA Present (A) Not Estab.    Bacteria, UA None seen None seen/Few  CBC with Differential/Platelet  Result Value Ref Range   WBC 7.0 3.4 - 10.8 x10E3/uL   RBC 4.86 3.77 - 5.28 x10E6/uL   Hemoglobin 13.6 11.1 - 15.9 g/dL   Hematocrit 04.5 40.9 - 46.6 %   MCV 85 79 - 97 fL   MCH 28.0 26.6 - 33.0 pg   MCHC 33.0 31.5 - 35.7 g/dL   RDW 81.1 91.4 - 78.2 %   Platelets 225 150 - 450 x10E3/uL   Neutrophils 59 Not Estab. %   Lymphs 24 Not Estab. %   Monocytes 14 Not Estab. %   Eos 1 Not Estab. %   Basos 1 Not Estab. %   Neutrophils Absolute 4.2 1.4 - 7.0 x10E3/uL   Lymphocytes Absolute 1.7 0.7 - 3.1 x10E3/uL   Monocytes Absolute 1.0 (H) 0.1 - 0.9 x10E3/uL   EOS (ABSOLUTE) 0.1 0.0 - 0.4 x10E3/uL   Basophils Absolute 0.1 0.0 - 0.2 x10E3/uL   Immature Granulocytes 1 Not Estab. %   Immature Grans (Abs) 0.1 0.0 - 0.1 x10E3/uL  Comprehensive metabolic panel  Result Value Ref Range   Glucose 109 (H) 70 - 99 mg/dL   BUN 8 8 - 27 mg/dL   Creatinine, Ser 9.56 0.57 - 1.00 mg/dL   eGFR 71 >21 HY/QMV/7.84   BUN/Creatinine Ratio 9 (L) 12 - 28   Sodium 141 134 - 144 mmol/L   Potassium 3.6 3.5 - 5.2 mmol/L   Chloride 100 96 - 106 mmol/L   CO2 22 20 - 29 mmol/L   Calcium 9.6 8.7 - 10.3 mg/dL   Total Protein 7.3 6.0 - 8.5 g/dL   Albumin 4.6 3.9 - 4.9 g/dL   Globulin, Total 2.7 1.5 - 4.5 g/dL   Bilirubin Total 0.3 0.0 - 1.2 mg/dL   Alkaline Phosphatase 98 44 - 121 IU/L   AST 28 0 - 40 IU/L   ALT 33 (H) 0 - 32 IU/L  Lipid Panel w/o Chol/HDL Ratio  Result Value Ref Range   Cholesterol, Total 216 (H) 100 - 199 mg/dL   Triglycerides 696 (H) 0 - 149 mg/dL   HDL 46 >29 mg/dL   VLDL Cholesterol Cal 36 5 - 40 mg/dL   LDL Chol Calc (NIH) 528 (H) 0 - 99 mg/dL  Urinalysis, Routine w reflex microscopic  Result Value Ref Range   Specific Gravity, UA >1.030 (H) 1.005 - 1.030   pH, UA 5.5 5.0 - 7.5   Color, UA Yellow Yellow   Appearance Ur Clear Clear   Leukocytes,UA Negative Negative   Protein,UA Trace (A) Negative/Trace    Glucose, UA Negative Negative   Ketones, UA Negative Negative   RBC, UA Trace (A) Negative   Bilirubin, UA Negative Negative   Urobilinogen, Ur 0.2 0.2 - 1.0 mg/dL   Nitrite, UA Negative Negative   Microscopic Examination See below:   TSH  Result Value Ref Range   TSH 0.805 0.450 - 4.500 uIU/mL  Microalbumin, Urine Waived  Result Value Ref Range   Microalb, Ur Waived 80 (H) 0 - 19 mg/L   Creatinine, Urine Waived 300 10 - 300 mg/dL   Microalb/Creat Ratio 30-300 (H) <30 mg/g  HIV Antibody (routine testing w rflx)  Result Value  Ref Range   HIV Screen 4th Generation wRfx Non Reactive Non Reactive      Assessment & Plan:   Problem List Items Addressed This Visit       Cardiovascular and Mediastinum   HTN (hypertension)    Has been running high at home. Will start on low dose lisinopril and check labs. Recheck about 4 weeks. Call with any concerns.       Relevant Medications   atorvastatin (LIPITOR) 10 MG tablet   lisinopril (ZESTRIL) 5 MG tablet   Other Relevant Orders   CBC with Differential/Platelet (Completed)   Comprehensive metabolic panel (Completed)   Urinalysis, Routine w reflex microscopic (Completed)   TSH (Completed)   Microalbumin, Urine Waived (Completed)     Other   HLD (hyperlipidemia)    Has ben off her atorvastatin for about 2 weeks. Will restart. Refills given. Labs drawn today.       Relevant Medications   atorvastatin (LIPITOR) 10 MG tablet   lisinopril (ZESTRIL) 5 MG tablet   Other Relevant Orders   CBC with Differential/Platelet (Completed)   Comprehensive metabolic panel (Completed)   Lipid Panel w/o Chol/HDL Ratio (Completed)   Other Visit Diagnoses     Upper respiratory tract infection, unspecified type    -  Primary   Will check COVID. Await resutls. Symptomatic care. Call with any concerns.   Relevant Orders   Rapid Strep Screen ([facility] ONLY) (Completed)   Novel Coronavirus, NAA ([facility]) (Completed)   Screening for HIV (human  immunodeficiency virus)       Labs drawn today. Await results.   Relevant Orders   HIV Antibody (routine testing w rflx) (Completed)   Encounter for screening mammogram for malignant neoplasm of breast       Mammogram ordered today.   Relevant Orders   MM 3D SCREENING MAMMOGRAM BILATERAL BREAST        Follow up plan: Return in about 5 weeks (around 12/18/2022) for physical, follow up BP.

## 2022-11-13 NOTE — Assessment & Plan Note (Signed)
Has been running high at home. Will start on low dose lisinopril and check labs. Recheck about 4 weeks. Call with any concerns.

## 2022-11-13 NOTE — Assessment & Plan Note (Signed)
Has ben off her atorvastatin for about 2 weeks. Will restart. Refills given. Labs drawn today.

## 2022-11-15 ENCOUNTER — Telehealth: Payer: Self-pay

## 2022-11-15 MED ORDER — NIRMATRELVIR/RITONAVIR (PAXLOVID)TABLET: 3.0000 | ORAL_TABLET | Freq: Two times a day (BID) | ORAL | 0 refills | Status: AC

## 2022-11-15 NOTE — Telephone Encounter (Signed)
Pt called and is requesting "whatever it is that Dr. Olevia Perches was going to give me for testing positive for Covid."

## 2022-11-15 NOTE — Telephone Encounter (Signed)
Rx sent to her pharmacy 

## 2022-11-15 NOTE — Telephone Encounter (Signed)
Pt called regarding COVID results. Pt is + for COVID.  Component Ref Range & Units 2 d ago  SARS-CoV-2, NAA Not Detected Detected Abnormal   Comment: Patients who have a positive COVID-19 test result may now have

## 2022-11-15 NOTE — Addendum Note (Signed)
Addended by: Dorcas Carrow on: 11/15/2022 02:04 PM   Modules accepted: Orders

## 2022-11-22 ENCOUNTER — Ambulatory Visit
Admission: RE | Admit: 2022-11-22 | Discharge: 2022-11-22 | Disposition: A | Discharge status: Home / Self Care | Payer: No Typology Code available for payment source | Source: Ambulatory Visit | Attending: Family Medicine | Admitting: Family Medicine

## 2022-11-22 DIAGNOSIS — Z1231 Encounter for screening mammogram for malignant neoplasm of breast: Secondary | ICD-10-CM | POA: Diagnosis present

## 2022-12-07 ENCOUNTER — Encounter: Payer: Self-pay | Admitting: Family Medicine

## 2022-12-19 ENCOUNTER — Ambulatory Visit (INDEPENDENT_AMBULATORY_CARE_PROVIDER_SITE_OTHER): Payer: No Typology Code available for payment source | Admitting: Family Medicine

## 2022-12-19 ENCOUNTER — Encounter: Payer: Self-pay | Admitting: Family Medicine

## 2022-12-19 VITALS — BP 121/76 | HR 69 | Ht 64.0 in | Wt 174.6 lb

## 2022-12-19 DIAGNOSIS — I10 Essential (primary) hypertension: Secondary | ICD-10-CM | POA: Diagnosis not present

## 2022-12-19 DIAGNOSIS — Z0000 Encounter for general adult medical examination without abnormal findings: Secondary | ICD-10-CM

## 2022-12-19 MED ORDER — TRIAMCINOLONE ACETONIDE 0.5 % EX OINT: 1.0000 | TOPICAL_OINTMENT | Freq: Two times a day (BID) | CUTANEOUS | 6 refills | Status: AC

## 2022-12-19 NOTE — Progress Notes (Signed)
BP 121/76   Pulse 69   Ht 5\' 4"  (1.626 m)   Wt 174 lb 9.6 oz (79.2 kg)   SpO2 97%   BMI 29.97 kg/m    Subjective:    Patient ID: Tanya Howell, female    DOB: 28-Sep-1960, 62 y.o.   MRN: [id number]  HPI: Tanya Howell is a 62 y.o. female presenting on 12/19/2022 for comprehensive medical examination. Current medical complaints include:  HYPERTENSION  Hypertension status: controlled  Satisfied with current treatment? yes Duration of hypertension: chronic BP monitoring frequency:  not checking BP medication side effects:  no Medication compliance: excellent compliance Previous BP meds:lisinopril Aspirin: no Recurrent headaches: no Visual changes: no Palpitations: no Dyspnea: no Chest pain: no Lower extremity edema: no Dizzy/lightheaded: no  Menopausal Symptoms: no  Depression Screen done today and results listed below:     12/19/2022   10:39 AM 11/13/2022    3:28 PM 08/10/2021    9:13 AM 07/11/2021    1:35 PM 12/19/2020   10:57 AM  Depression screen PHQ 2/9  Decreased Interest 0 0 0 0 0  Down, Depressed, Hopeless 0 0 0 0 0  PHQ - 2 Score 0 0 0 0 0  Altered sleeping 0 0 0 0 0  Tired, decreased energy 1 0 0 0 0  Change in appetite 0 0 0 0 0  Feeling bad or failure about yourself  0 0 0 0 0  Trouble concentrating 0 0 0 0 0  Moving slowly or fidgety/restless 0 0 0 0 0  Suicidal thoughts 0 0 0 0 0  PHQ-9 Score 1 0 0 0 0  Difficult doing work/chores Not difficult at all Not difficult at all  Not difficult at all Not difficult at all    Past Medical History:  Past Medical History:  Diagnosis Date   COVID-19 07/2018   Hyperlipidemia    Motion sickness    amuesment park rides   Osteopenia 11/01/2015   Psoriasis    Vertigo    none for over 5 yrs   Wears dentures    full upper and lower    Surgical History:  Past Surgical History:  Procedure Laterality Date   CHOLECYSTECTOMY  1990   COLONOSCOPY WITH PROPOFOL N/A 07/15/2020   Procedure: COLONOSCOPY WITH  PROPOFOL;  Surgeon: Midge Minium, MD;  Location: [facility];  Service: Endoscopy;  Laterality: N/A;  priority 4   TUBAL LIGATION      Medications:  Current Outpatient Medications on File Prior to Visit  Medication Sig   atorvastatin (LIPITOR) 10 MG tablet Take 1 tablet (10 mg total) by mouth at bedtime.   lisinopril (ZESTRIL) 5 MG tablet Take 1 tablet (5 mg total) by mouth daily.   meclizine (ANTIVERT) 25 MG tablet Take 1 tablet (25 mg total) by mouth 3 (three) times daily as needed for dizziness.   No current facility-administered medications on file prior to visit.    Allergies:  No Known Allergies  Social History:  Social History   Socioeconomic History   Marital status: Married    Spouse name: Loraine Leriche   Number of children: 1   Years of education: Not on file   Highest education level: High school graduate  Occupational History   Not on file  Tobacco Use   Smoking status: Former    Current packs/day: 0.00    Average packs/day: 1 pack/day for 28.0 years (28.0 ttl pk-yrs)    Types: Cigarettes    Start  date: 19    Quit date: 2011    Years since quitting: 13.7   Smokeless tobacco: Never  Vaping Use   Vaping status: Former  Substance and Sexual Activity   Alcohol use: No    Alcohol/week: 0.0 standard drinks of alcohol    Comment: rare   Drug use: No   Sexual activity: Yes    Birth control/protection: Surgical  Other Topics Concern   Not on file  Social History Narrative   Not on file   Social Determinants of Health   Financial Resource Strain: Low Risk  (05/06/2018)   Overall Financial Resource Strain (CARDIA)    Difficulty of Paying Living Expenses: Not hard at all  Food Insecurity: No Food Insecurity (05/06/2018)   Hunger Vital Sign    Worried About Running Out of Food in the Last Year: Never true    Ran Out of Food in the Last Year: Never true  Transportation Needs: No Transportation Needs (11/10/2019)   PRAPARE - Scientist, research (physical sciences) (Medical): No    Lack of Transportation (Non-Medical): No  Physical Activity: Sufficiently Active (05/06/2018)   Exercise Vital Sign    Days of Exercise per Week: 7 days    Minutes of Exercise per Session: 30 min  Stress: No Stress Concern Present (05/06/2018)   [employer] of Occupational Health - Occupational Stress Questionnaire    Feeling of Stress : Not at all  Social Connections: Somewhat Isolated (05/06/2018)   Social Connection and Isolation Panel [NHANES]    Frequency of Communication with Friends and Family: More than three times a week    Frequency of Social Gatherings with Friends and Family: Twice a week    Attends Religious Services: Never    Database administrator or Organizations: No    Attends Banker Meetings: Never    Marital Status: Married  Catering manager Violence: Not At Risk (11/10/2019)   Humiliation, Afraid, Rape, and Kick questionnaire    Fear of Current or Ex-Partner: No    Emotionally Abused: No    Physically Abused: No    Sexually Abused: No   Social History   Tobacco Use  Smoking Status Former   Current packs/day: 0.00   Average packs/day: 1 pack/day for 28.0 years (28.0 ttl pk-yrs)   Types: Cigarettes   Start date: 53   Quit date: 2011   Years since quitting: 13.7  Smokeless Tobacco Never   Social History   Substance and Sexual Activity  Alcohol Use No   Alcohol/week: 0.0 standard drinks of alcohol   Comment: rare    Family History:  Family History  Problem Relation Age of Onset   Colon cancer Brother        25    Breast cancer Mother    Breast cancer Maternal Aunt    Pneumonia Father    Diabetes Maternal Grandmother     Past medical history, surgical history, medications, allergies, family history and social history reviewed with patient today and changes made to appropriate areas of the chart.   Review of Systems  Constitutional:  Positive for malaise/fatigue. Negative for chills, diaphoresis  and weight loss.  HENT: Negative.    Eyes: Negative.   Respiratory:  Positive for cough. Negative for hemoptysis, sputum production, shortness of breath and wheezing.   Cardiovascular: Negative.   Gastrointestinal:  Positive for heartburn (better with tums). Negative for abdominal pain, blood in stool, constipation, diarrhea, melena, nausea and vomiting.  Genitourinary: Negative.  Musculoskeletal: Negative.   Skin: Negative.   Neurological: Negative.   Endo/Heme/Allergies: Negative.   Psychiatric/Behavioral: Negative.     All other ROS negative except what is listed above and in the HPI.      Objective:    BP 121/76   Pulse 69   Ht 5\' 4"  (1.626 m)   Wt 174 lb 9.6 oz (79.2 kg)   SpO2 97%   BMI 29.97 kg/m   Wt Readings from Last 3 Encounters:  12/19/22 174 lb 9.6 oz (79.2 kg)  11/13/22 172 lb 12.8 oz (78.4 kg)  10/12/21 168 lb (76.2 kg)    Physical Exam Vitals and nursing note reviewed.  Constitutional:      General: She is not in acute distress.    Appearance: Normal appearance. She is not ill-appearing, toxic-appearing or diaphoretic.  HENT:     Head: Normocephalic and atraumatic.     Right Ear: Tympanic membrane, ear canal and external ear normal. There is no impacted cerumen.     Left Ear: Tympanic membrane, ear canal and external ear normal. There is no impacted cerumen.     Nose: Nose normal. No congestion or rhinorrhea.     Mouth/Throat:     Mouth: Mucous membranes are moist.     Pharynx: Oropharynx is clear. No oropharyngeal exudate or posterior oropharyngeal erythema.  Eyes:     General: No scleral icterus.       Right eye: No discharge.        Left eye: No discharge.     Extraocular Movements: Extraocular movements intact.     Conjunctiva/sclera: Conjunctivae normal.     Pupils: Pupils are equal, round, and reactive to light.  Neck:     Vascular: No carotid bruit.  Cardiovascular:     Rate and Rhythm: Normal rate and regular rhythm.     Pulses: Normal  pulses.     Heart sounds: No murmur heard.    No friction rub. No gallop.  Pulmonary:     Effort: Pulmonary effort is normal. No respiratory distress.     Breath sounds: Normal breath sounds. No stridor. No wheezing, rhonchi or rales.  Chest:     Chest wall: No tenderness.  Abdominal:     General: Abdomen is flat. Bowel sounds are normal. There is no distension.     Palpations: Abdomen is soft. There is no mass.     Tenderness: There is no abdominal tenderness. There is no right CVA tenderness, left CVA tenderness, guarding or rebound.     Hernia: No hernia is present.  Genitourinary:    Comments: Breast and pelvic exams deferred with shared decision making Musculoskeletal:        General: No swelling, tenderness, deformity or signs of injury.     Cervical back: Normal range of motion and neck supple. No rigidity. No muscular tenderness.     Right lower leg: No edema.     Left lower leg: No edema.  Lymphadenopathy:     Cervical: No cervical adenopathy.  Skin:    General: Skin is warm and dry.     Capillary Refill: Capillary refill takes less than 2 seconds.     Coloration: Skin is not jaundiced or pale.     Findings: No bruising, erythema, lesion or rash.  Neurological:     General: No focal deficit present.     Mental Status: She is alert and oriented to person, place, and time. Mental status is at baseline.  Cranial Nerves: No cranial nerve deficit.     Sensory: No sensory deficit.     Motor: No weakness.     Coordination: Coordination normal.     Gait: Gait normal.     Deep Tendon Reflexes: Reflexes normal.  Psychiatric:        Mood and Affect: Mood normal.        Behavior: Behavior normal.        Thought Content: Thought content normal.        Judgment: Judgment normal.     Results for orders placed or performed in visit on 11/13/22  Rapid Strep Screen ([facility] ONLY)   Specimen: Other   Other  Result Value Ref Range   Strep Gp A Ag, IA W/Reflex Negative  Negative  Novel Coronavirus, NAA ([facility])   Specimen: Nasopharyngeal(NP) swabs in vial transport medium  Result Value Ref Range   SARS-CoV-2, NAA Detected (A) Not Detected  Culture, Group A Strep   Other  Result Value Ref Range   Strep A Culture Negative   Microscopic Examination   Urine  Result Value Ref Range   WBC, UA 0-5 0 - 5 /hpf   RBC, Urine 0-2 0 - 2 /hpf   Epithelial Cells (non renal) 0-10 0 - 10 /hpf   Mucus, UA Present (A) Not Estab.   Bacteria, UA None seen None seen/Few  CBC with Differential/Platelet  Result Value Ref Range   WBC 7.0 3.4 - 10.8 x10E3/uL   RBC 4.86 3.77 - 5.28 x10E6/uL   Hemoglobin 13.6 11.1 - 15.9 g/dL   Hematocrit 56.3 87.5 - 46.6 %   MCV 85 79 - 97 fL   MCH 28.0 26.6 - 33.0 pg   MCHC 33.0 31.5 - 35.7 g/dL   RDW 64.3 32.9 - 51.8 %   Platelets 225 150 - 450 x10E3/uL   Neutrophils 59 Not Estab. %   Lymphs 24 Not Estab. %   Monocytes 14 Not Estab. %   Eos 1 Not Estab. %   Basos 1 Not Estab. %   Neutrophils Absolute 4.2 1.4 - 7.0 x10E3/uL   Lymphocytes Absolute 1.7 0.7 - 3.1 x10E3/uL   Monocytes Absolute 1.0 (H) 0.1 - 0.9 x10E3/uL   EOS (ABSOLUTE) 0.1 0.0 - 0.4 x10E3/uL   Basophils Absolute 0.1 0.0 - 0.2 x10E3/uL   Immature Granulocytes 1 Not Estab. %   Immature Grans (Abs) 0.1 0.0 - 0.1 x10E3/uL  Comprehensive metabolic panel  Result Value Ref Range   Glucose 109 (H) 70 - 99 mg/dL   BUN 8 8 - 27 mg/dL   Creatinine, Ser 8.41 0.57 - 1.00 mg/dL   eGFR 71 >66 AY/TKZ/6.01   BUN/Creatinine Ratio 9 (L) 12 - 28   Sodium 141 134 - 144 mmol/L   Potassium 3.6 3.5 - 5.2 mmol/L   Chloride 100 96 - 106 mmol/L   CO2 22 20 - 29 mmol/L   Calcium 9.6 8.7 - 10.3 mg/dL   Total Protein 7.3 6.0 - 8.5 g/dL   Albumin 4.6 3.9 - 4.9 g/dL   Globulin, Total 2.7 1.5 - 4.5 g/dL   Bilirubin Total 0.3 0.0 - 1.2 mg/dL   Alkaline Phosphatase 98 44 - 121 IU/L   AST 28 0 - 40 IU/L   ALT 33 (H) 0 - 32 IU/L  Lipid Panel w/o Chol/HDL Ratio  Result Value Ref Range    Cholesterol, Total 216 (H) 100 - 199 mg/dL   Triglycerides 093 (H) 0 - 149 mg/dL  HDL 46 >39 mg/dL   VLDL Cholesterol Cal 36 5 - 40 mg/dL   LDL Chol Calc (NIH) 253 (H) 0 - 99 mg/dL  Urinalysis, Routine w reflex microscopic  Result Value Ref Range   Specific Gravity, UA >1.030 (H) 1.005 - 1.030   pH, UA 5.5 5.0 - 7.5   Color, UA Yellow Yellow   Appearance Ur Clear Clear   Leukocytes,UA Negative Negative   Protein,UA Trace (A) Negative/Trace   Glucose, UA Negative Negative   Ketones, UA Negative Negative   RBC, UA Trace (A) Negative   Bilirubin, UA Negative Negative   Urobilinogen, Ur 0.2 0.2 - 1.0 mg/dL   Nitrite, UA Negative Negative   Microscopic Examination See below:   TSH  Result Value Ref Range   TSH 0.805 0.450 - 4.500 uIU/mL  Microalbumin, Urine Waived  Result Value Ref Range   Microalb, Ur Waived 80 (H) 0 - 19 mg/L   Creatinine, Urine Waived 300 10 - 300 mg/dL   Microalb/Creat Ratio 30-300 (H) <30 mg/g  HIV Antibody (routine testing w rflx)  Result Value Ref Range   HIV Screen 4th Generation wRfx Non Reactive Non Reactive      Assessment & Plan:   Problem List Items Addressed This Visit       Cardiovascular and Mediastinum   HTN (hypertension)    Under good control on current regimen. Continue current regimen. Continue to monitor. Call with any concerns. Refills up to date. Labs drawn today.        Relevant Orders   Basic metabolic panel   Other Visit Diagnoses     Routine general medical examination at a health care facility    -  Primary   Vaccines up to date. Screening labs checked today. Pap, mammo and colonoscopy up to date. Continue diet and exercise. Call with any concerns.        Follow up plan: Return in about 6 months (around 06/18/2023).   LABORATORY TESTING:  - Pap smear: up to date  IMMUNIZATIONS:   - Tdap: Tetanus vaccination status reviewed: last tetanus booster within 10 years. - Influenza: Refused - Pneumovax: Not applicable -  Prevnar: Not applicable - COVID: Refused - HPV: Not applicable - Shingrix vaccine:  will think about  SCREENING: -Mammogram: Up to date  - Colonoscopy: Up to date   PATIENT COUNSELING:   Advised to take 1 mg of folate supplement per day if capable of pregnancy.   Sexuality: Discussed sexually transmitted diseases, partner selection, use of condoms, avoidance of unintended pregnancy  and contraceptive alternatives.   Advised to avoid cigarette smoking.  I discussed with the patient that most people either abstain from alcohol or drink within safe limits (<=14/week and <=4 drinks/occasion for males, <=7/weeks and <= 3 drinks/occasion for females) and that the risk for alcohol disorders and other health effects rises proportionally with the number of drinks per week and how often a drinker exceeds daily limits.  Discussed cessation/primary prevention of drug use and availability of treatment for abuse.   Diet: Encouraged to adjust caloric intake to maintain  or achieve ideal body weight, to reduce intake of dietary saturated fat and total fat, to limit sodium intake by avoiding high sodium foods and not adding table salt, and to maintain adequate dietary potassium and calcium preferably from fresh fruits, vegetables, and low-fat dairy products.    stressed the importance of regular exercise  Injury prevention: Discussed safety belts, safety helmets, smoke detector, smoking near bedding or  upholstery.   Dental health: Discussed importance of regular tooth brushing, flossing, and dental visits.    NEXT PREVENTATIVE PHYSICAL DUE IN 1 YEAR. Return in about 6 months (around 06/18/2023).

## 2022-12-19 NOTE — Assessment & Plan Note (Signed)
Under good control on current regimen. Continue current regimen. Continue to monitor. Call with any concerns. Refills up to date. Labs drawn today.  

## 2022-12-20 LAB — BASIC METABOLIC PANEL
BUN/Creatinine Ratio: 16 (ref 12–28)
BUN: 14 mg/dL (ref 8–27)
CO2: 23 mmol/L (ref 20–29)
Calcium: 9.9 mg/dL (ref 8.7–10.3)
Chloride: 103 mmol/L (ref 96–106)
Creatinine, Ser: 0.87 mg/dL (ref 0.57–1.00)
Glucose: 89 mg/dL (ref 70–99)
Potassium: 4.3 mmol/L (ref 3.5–5.2)
Sodium: 141 mmol/L (ref 134–144)
eGFR: 76 mL/min/{1.73_m2} (ref >59)

## 2023-01-10 ENCOUNTER — Encounter: Payer: Self-pay | Admitting: Family Medicine

## 2023-01-15 ENCOUNTER — Encounter: Payer: Self-pay | Admitting: Family Medicine

## 2023-01-16 ENCOUNTER — Ambulatory Visit: Payer: BC Managed Care – PPO | Admitting: Nurse Practitioner

## 2023-01-16 ENCOUNTER — Ambulatory Visit: Payer: No Typology Code available for payment source | Admitting: Nurse Practitioner

## 2023-01-17 ENCOUNTER — Telehealth: Payer: Self-pay | Admitting: Family Medicine

## 2023-01-17 MED ORDER — LOSARTAN POTASSIUM 25 MG PO TABS: 12.5000 mg | ORAL_TABLET | Freq: Every day | ORAL | 2 refills | Status: DC

## 2023-01-17 NOTE — Telephone Encounter (Signed)
Rx sent to her pharmacy. Please get her appt scheduled in 1 month to check on BP and cough. Thanks!

## 2023-01-17 NOTE — Telephone Encounter (Signed)
Pt is calling in because she was taking lisinopril (ZESTRIL) 5 MG tablet [[id number]] and it gave her a dry cough that she couldn't get rid of. Pt wants to know if Dr. Laural Benes can prescribe her a different medication.

## 2023-01-18 NOTE — Telephone Encounter (Signed)
Appointment has been made

## 2023-02-18 ENCOUNTER — Ambulatory Visit: Payer: No Typology Code available for payment source | Admitting: Family Medicine

## 2023-02-18 ENCOUNTER — Encounter: Payer: Self-pay | Admitting: Family Medicine

## 2023-02-18 VITALS — BP 119/72 | HR 81 | Temp 98.7°F | Ht 64.0 in | Wt 174.2 lb

## 2023-02-18 DIAGNOSIS — I10 Essential (primary) hypertension: Secondary | ICD-10-CM | POA: Diagnosis not present

## 2023-02-18 MED ORDER — LOSARTAN POTASSIUM 25 MG PO TABS: 12.5000 mg | ORAL_TABLET | Freq: Every day | ORAL | 1 refills | Status: DC

## 2023-02-18 NOTE — Assessment & Plan Note (Signed)
Under good control on current regimen. Continue current regimen. Continue to monitor. Call with any concerns. Refills given. Labs drawn today.   

## 2023-02-18 NOTE — Progress Notes (Signed)
BP 119/72   Pulse 81   Temp 98.7 F (37.1 C) (Oral)   Ht 5\' 4"  (1.626 m)   Wt 174 lb 3.2 oz (79 kg)   SpO2 97%   BMI 29.90 kg/m    Subjective:    Patient ID: Tanya Howell, female    DOB: Jan 31, 1961, 62 y.o.   MRN: [id number]  HPI: Tanya Howell is a 62 y.o. female  Chief Complaint  Patient presents with   Hypertension   HYPERTENSION  Hypertension status: controlled  Satisfied with current treatment? yes Duration of hypertension: chronic BP monitoring frequency:  not checking BP medication side effects:  no Medication compliance: excellent compliance Previous BP meds:lisinopril (cough, losartan) Aspirin: no Recurrent headaches: no Visual changes: no Palpitations: no Dyspnea: no Chest pain: no Lower extremity edema: no Dizzy/lightheaded: no   Relevant past medical, surgical, family and social history reviewed and updated as indicated. Interim medical history since our last visit reviewed. Allergies and medications reviewed and updated.  Review of Systems  Constitutional: Negative.   Respiratory: Negative.    Cardiovascular: Negative.   Gastrointestinal: Negative.   Musculoskeletal: Negative.   Psychiatric/Behavioral: Negative.      Per HPI unless specifically indicated above     Objective:    BP 119/72   Pulse 81   Temp 98.7 F (37.1 C) (Oral)   Ht 5\' 4"  (1.626 m)   Wt 174 lb 3.2 oz (79 kg)   SpO2 97%   BMI 29.90 kg/m   Wt Readings from Last 3 Encounters:  02/18/23 174 lb 3.2 oz (79 kg)  12/19/22 174 lb 9.6 oz (79.2 kg)  11/13/22 172 lb 12.8 oz (78.4 kg)    Physical Exam Vitals and nursing note reviewed.  Constitutional:      General: She is not in acute distress.    Appearance: Normal appearance. She is not ill-appearing, toxic-appearing or diaphoretic.  HENT:     Head: Normocephalic and atraumatic.     Right Ear: External ear normal.     Left Ear: External ear normal.     Nose: Nose normal.     Mouth/Throat:     Mouth:  Mucous membranes are moist.     Pharynx: Oropharynx is clear.  Eyes:     General: No scleral icterus.       Right eye: No discharge.        Left eye: No discharge.     Extraocular Movements: Extraocular movements intact.     Conjunctiva/sclera: Conjunctivae normal.     Pupils: Pupils are equal, round, and reactive to light.  Cardiovascular:     Rate and Rhythm: Normal rate and regular rhythm.     Pulses: Normal pulses.     Heart sounds: Normal heart sounds. No murmur heard.    No friction rub. No gallop.  Pulmonary:     Effort: Pulmonary effort is normal. No respiratory distress.     Breath sounds: Normal breath sounds. No stridor. No wheezing, rhonchi or rales.  Chest:     Chest wall: No tenderness.  Musculoskeletal:        General: Normal range of motion.     Cervical back: Normal range of motion and neck supple.  Skin:    General: Skin is warm and dry.     Capillary Refill: Capillary refill takes less than 2 seconds.     Coloration: Skin is not jaundiced or pale.     Findings: No bruising, erythema, lesion or rash.  Neurological:     General: No focal deficit present.     Mental Status: She is alert and oriented to person, place, and time. Mental status is at baseline.  Psychiatric:        Mood and Affect: Mood normal.        Behavior: Behavior normal.        Thought Content: Thought content normal.        Judgment: Judgment normal.     Results for orders placed or performed in visit on 12/19/22  Basic metabolic panel  Result Value Ref Range   Glucose 89 70 - 99 mg/dL   BUN 14 8 - 27 mg/dL   Creatinine, Ser 7.84 0.57 - 1.00 mg/dL   eGFR 76 >69 GE/XBM/8.41   BUN/Creatinine Ratio 16 12 - 28   Sodium 141 134 - 144 mmol/L   Potassium 4.3 3.5 - 5.2 mmol/L   Chloride 103 96 - 106 mmol/L   CO2 23 20 - 29 mmol/L   Calcium 9.9 8.7 - 10.3 mg/dL      Assessment & Plan:   Problem List Items Addressed This Visit       Cardiovascular and Mediastinum   HTN  (hypertension) - Primary    Under good control on current regimen. Continue current regimen. Continue to monitor. Call with any concerns. Refills given. Labs drawn today.        Relevant Medications   losartan (COZAAR) 25 MG tablet   Other Relevant Orders   Basic metabolic panel     Follow up plan: Return in about 5 months (around 07/19/2023).

## 2023-02-19 LAB — BASIC METABOLIC PANEL
BUN/Creatinine Ratio: 12 (ref 12–28)
BUN: 11 mg/dL (ref 8–27)
CO2: 24 mmol/L (ref 20–29)
Calcium: 9.6 mg/dL (ref 8.7–10.3)
Chloride: 105 mmol/L (ref 96–106)
Creatinine, Ser: 0.93 mg/dL (ref 0.57–1.00)
Glucose: 92 mg/dL (ref 70–99)
Potassium: 3.7 mmol/L (ref 3.5–5.2)
Sodium: 142 mmol/L (ref 134–144)
eGFR: 69 mL/min/{1.73_m2} (ref >59)

## 2023-05-10 ENCOUNTER — Other Ambulatory Visit: Payer: Self-pay | Admitting: Family Medicine

## 2023-05-10 DIAGNOSIS — E785 Hyperlipidemia, unspecified: Secondary | ICD-10-CM

## 2023-05-10 NOTE — Telephone Encounter (Signed)
Requested Prescriptions  Pending Prescriptions Disp Refills   atorvastatin (LIPITOR) 10 MG tablet [Pharmacy Med Name: Atorvastatin Calcium 10 MG Oral Tablet] 90 tablet 1    Sig: TAKE 1 TABLET BY MOUTH AT BEDTIME     Cardiovascular:  Antilipid - Statins Failed - 05/10/2023  3:05 PM      Failed - Lipid Panel in normal range within the last 12 months    Cholesterol, Total  Date Value Ref Range Status  11/13/2022 216 (H) 100 - 199 mg/dL Final   LDL Cholesterol (Calc)  Date Value Ref Range Status  12/19/2020 166 (H) mg/dL (calc) Final    Comment:    Reference range: <100 . Desirable range <100 mg/dL for primary prevention;   <70 mg/dL for patients with CHD or diabetic patients  with > or = 2 CHD risk factors. Marland Kitchen LDL-C is now calculated using the Martin-Hopkins  calculation, which is a validated novel method providing  better accuracy than the Friedewald equation in the  estimation of LDL-C.  Horald Pollen et al. Lenox Ahr. 1610;960(45): 2061-2068  (http://education.[facility].com/faq/FAQ164)    LDL Chol Calc (NIH)  Date Value Ref Range Status  11/13/2022 134 (H) 0 - 99 mg/dL Final   HDL  Date Value Ref Range Status  11/13/2022 46 >39 mg/dL Final   Triglycerides  Date Value Ref Range Status  11/13/2022 199 (H) 0 - 149 mg/dL Final         Passed - Patient is not pregnant      Passed - Valid encounter within last 12 months    Recent Outpatient Visits           2 months ago Primary hypertension   [place] [facility] [place], Megan P, DO   4 months ago Routine general medical examination at a health care facility   [facility], Megan P, DO   5 months ago Upper respiratory tract infection, unspecified type   [place] [place], Megan P, DO   1 year ago Hyperlipidemia, unspecified hyperlipidemia type   [facility] Danelle Berry, PA-C   1 year ago Elevated BP without diagnosis  of hypertension   [facility] Danelle Berry, PA-C       Future Appointments             In 1 month Laural Benes, Oralia Rud, DO  [facility]

## 2023-06-18 ENCOUNTER — Encounter: Payer: Self-pay | Admitting: Family Medicine

## 2023-06-18 ENCOUNTER — Ambulatory Visit: Payer: Self-pay | Admitting: Family Medicine

## 2023-06-18 VITALS — BP 129/72 | Temp 98.7°F | Resp 16 | Ht 64.02 in | Wt 176.2 lb

## 2023-06-18 DIAGNOSIS — J189 Pneumonia, unspecified organism: Secondary | ICD-10-CM | POA: Diagnosis not present

## 2023-06-18 DIAGNOSIS — I10 Essential (primary) hypertension: Secondary | ICD-10-CM | POA: Diagnosis not present

## 2023-06-18 DIAGNOSIS — E785 Hyperlipidemia, unspecified: Secondary | ICD-10-CM

## 2023-06-18 MED ORDER — PREDNISONE 10 MG PO TABS: ORAL_TABLET | ORAL | 0 refills | Status: DC

## 2023-06-18 MED ORDER — DOXYCYCLINE HYCLATE 100 MG PO TABS: 100.0000 mg | ORAL_TABLET | Freq: Two times a day (BID) | ORAL | 0 refills | Status: DC

## 2023-06-18 NOTE — Assessment & Plan Note (Signed)
 Under good control on current regimen. Continue current regimen. Continue to monitor. Call with any concerns. Refills given. Labs drawn today.

## 2023-06-18 NOTE — Progress Notes (Signed)
 BP 129/72 (BP Location: Left Arm, Patient Position: Sitting, Cuff Size: Normal)   Temp 98.7 F (37.1 C) (Oral)   Resp 16   Ht 5' 4.02" (1.626 m)   Wt 176 lb 3.2 oz (79.9 kg)   SpO2 95%   BMI 30.23 kg/m    Subjective:    Patient ID: Tanya Howell, female    DOB: 1960/09/30, 63 y.o.   MRN: [id number]  HPI: Tanya Howell is a 64 y.o. female  Chief Complaint  Patient presents with   Hypertension   Cough    Had started a couple weeks ago and has since mostly resolved   HYPERTENSION / HYPERLIPIDEMIA Satisfied with current treatment? yes Duration of hypertension: chronic BP monitoring frequency: not checking BP medication side effects: no Past BP meds: losartan Duration of hyperlipidemia: chronic Cholesterol medication side effects: no Cholesterol supplements: none Past cholesterol medications: atorvastatin Medication compliance: excellent compliance Aspirin: no Recent stressors: no Recurrent headaches: no Visual changes: no Palpitations: no Dyspnea: no Chest pain: no Lower extremity edema: no Dizzy/lightheaded: no  COUGH Duration: 2 weeks Circumstances of initial development of cough: nothing Cough severity: moderate Cough description: irritating Aggravating factors:  nothing Alleviating factors: nothing Status:  stable Treatments attempted: cold/sinus and mucinex Wheezing: yes Shortness of breath: yes Chest pain: no Chest tightness:yes Nasal congestion: no Runny nose: no Postnasal drip: no Frequent throat clearing or swallowing: no Hemoptysis: no Fevers: no Night sweats: no Weight loss: no Heartburn: no Recent foreign travel: no Tuberculosis contacts: no   Relevant past medical, surgical, family and social history reviewed and updated as indicated. Interim medical history since our last visit reviewed. Allergies and medications reviewed and updated.  Review of Systems  Constitutional: Negative.   Respiratory:  Positive for cough and  shortness of breath. Negative for apnea, choking, chest tightness, wheezing and stridor.   Cardiovascular: Negative.   Gastrointestinal: Negative.   Musculoskeletal: Negative.   Psychiatric/Behavioral: Negative.      Per HPI unless specifically indicated above     Objective:    BP 129/72 (BP Location: Left Arm, Patient Position: Sitting, Cuff Size: Normal)   Temp 98.7 F (37.1 C) (Oral)   Resp 16   Ht 5' 4.02" (1.626 m)   Wt 176 lb 3.2 oz (79.9 kg)   SpO2 95%   BMI 30.23 kg/m   Wt Readings from Last 3 Encounters:  06/18/23 176 lb 3.2 oz (79.9 kg)  02/18/23 174 lb 3.2 oz (79 kg)  12/19/22 174 lb 9.6 oz (79.2 kg)    Physical Exam Vitals and nursing note reviewed.  Constitutional:      General: She is not in acute distress.    Appearance: Normal appearance. She is not ill-appearing, toxic-appearing or diaphoretic.  HENT:     Head: Normocephalic and atraumatic.     Right Ear: External ear normal.     Left Ear: External ear normal.     Nose: Nose normal.     Mouth/Throat:     Mouth: Mucous membranes are moist.     Pharynx: Oropharynx is clear.  Eyes:     General: No scleral icterus.       Right eye: No discharge.        Left eye: No discharge.     Extraocular Movements: Extraocular movements intact.     Conjunctiva/sclera: Conjunctivae normal.     Pupils: Pupils are equal, round, and reactive to light.  Cardiovascular:     Rate and Rhythm: Normal rate and  regular rhythm.     Pulses: Normal pulses.     Heart sounds: Normal heart sounds. No murmur heard.    No friction rub. No gallop.  Pulmonary:     Effort: Pulmonary effort is normal. No respiratory distress.     Breath sounds: No stridor. Wheezing (scattered throughout) and rhonchi (L lung) present. No rales.  Chest:     Chest wall: No tenderness.  Musculoskeletal:        General: Normal range of motion.     Cervical back: Normal range of motion and neck supple.  Skin:    General: Skin is warm and dry.      Capillary Refill: Capillary refill takes less than 2 seconds.     Coloration: Skin is not jaundiced or pale.     Findings: No bruising, erythema, lesion or rash.  Neurological:     General: No focal deficit present.     Mental Status: She is alert and oriented to person, place, and time. Mental status is at baseline.  Psychiatric:        Mood and Affect: Mood normal.        Behavior: Behavior normal.        Thought Content: Thought content normal.        Judgment: Judgment normal.     Results for orders placed or performed in visit on 02/18/23  Basic metabolic panel   Collection Time: 02/18/23  4:19 PM  Result Value Ref Range   Glucose 92 70 - 99 mg/dL   BUN 11 8 - 27 mg/dL   Creatinine, Ser 6.30 0.57 - 1.00 mg/dL   eGFR 69 >16 WF/UXN/2.35   BUN/Creatinine Ratio 12 12 - 28   Sodium 142 134 - 144 mmol/L   Potassium 3.7 3.5 - 5.2 mmol/L   Chloride 105 96 - 106 mmol/L   CO2 24 20 - 29 mmol/L   Calcium 9.6 8.7 - 10.3 mg/dL      Assessment & Plan:   Problem List Items Addressed This Visit       Cardiovascular and Mediastinum   HTN (hypertension) - Primary   Under good control on current regimen. Continue current regimen. Continue to monitor. Call with any concerns. Refills given. Labs drawn today.        Relevant Orders   Comprehensive metabolic panel     Other   HLD (hyperlipidemia)   Under good control on current regimen. Continue current regimen. Continue to monitor. Call with any concerns. Refills given. Labs drawn today.        Relevant Orders   Comprehensive metabolic panel   Lipid Panel w/o Chol/HDL Ratio   Other Visit Diagnoses       Pneumonia of right lower lobe due to infectious organism       Will treat with doxycycline and prednisone. Call if not getting better or getting worse. Recheck 2 weeks.   Relevant Medications   doxycycline (VIBRA-TABS) 100 MG tablet        Follow up plan: Return in about 2 weeks (around 07/02/2023) for lung recheck ok to  double book.

## 2023-06-19 ENCOUNTER — Encounter: Payer: Self-pay | Admitting: Family Medicine

## 2023-06-19 LAB — COMPREHENSIVE METABOLIC PANEL
ALT: 16 IU/L (ref 0–32)
AST: 15 IU/L (ref 0–40)
Albumin: 4.5 g/dL (ref 3.9–4.9)
Alkaline Phosphatase: 94 IU/L (ref 44–121)
BUN/Creatinine Ratio: 15 (ref 12–28)
BUN: 13 mg/dL (ref 8–27)
Bilirubin Total: 0.4 mg/dL (ref 0.0–1.2)
CO2: 22 mmol/L (ref 20–29)
Calcium: 9.7 mg/dL (ref 8.7–10.3)
Chloride: 105 mmol/L (ref 96–106)
Creatinine, Ser: 0.86 mg/dL (ref 0.57–1.00)
Globulin, Total: 2.4 g/dL (ref 1.5–4.5)
Glucose: 100 mg/dL — ABNORMAL HIGH (ref 70–99)
Potassium: 3.8 mmol/L (ref 3.5–5.2)
Sodium: 143 mmol/L (ref 134–144)
Total Protein: 6.9 g/dL (ref 6.0–8.5)
eGFR: 76 mL/min/{1.73_m2} (ref >59)

## 2023-06-19 LAB — LIPID PANEL W/O CHOL/HDL RATIO
Cholesterol, Total: 167 mg/dL (ref 100–199)
HDL: 48 mg/dL (ref >39)
LDL Chol Calc (NIH): 90 mg/dL (ref 0–99)
Triglycerides: 171 mg/dL — ABNORMAL HIGH (ref 0–149)
VLDL Cholesterol Cal: 29 mg/dL (ref 5–40)

## 2023-07-02 ENCOUNTER — Encounter: Payer: Self-pay | Admitting: Family Medicine

## 2023-07-02 ENCOUNTER — Ambulatory Visit: Admitting: Family Medicine

## 2023-07-02 VITALS — BP 112/71 | HR 74 | Temp 98.8°F | Resp 16 | Ht 64.02 in | Wt 178.8 lb

## 2023-07-02 DIAGNOSIS — J189 Pneumonia, unspecified organism: Secondary | ICD-10-CM | POA: Diagnosis not present

## 2023-07-02 NOTE — Progress Notes (Signed)
 BP 112/71 (BP Location: Left Arm, Patient Position: Sitting, Cuff Size: Normal)   Pulse 74   Temp 98.8 F (37.1 C) (Oral)   Resp 16   Ht 5' 4.02" (1.626 m)   Wt 178 lb 12.8 oz (81.1 kg)   SpO2 96%   BMI 30.68 kg/m    Subjective:    Patient ID: Tanya Howell, female    DOB: 1960-05-13, 63 y.o.   MRN: [id number]  HPI: Tanya Howell is a 63 y.o. female  Chief Complaint  Patient presents with   Pneumonia    Feels better overall. Cough is minor at this point.    Feeling much better. Still a little tired. Still has a little cough. No fevers. No chills. Tolerated her medicine well.   Relevant past medical, surgical, family and social history reviewed and updated as indicated. Interim medical history since our last visit reviewed. Allergies and medications reviewed and updated.  Review of Systems  Constitutional: Negative.   Respiratory:  Positive for cough. Negative for apnea, choking, chest tightness, shortness of breath, wheezing and stridor.   Cardiovascular: Negative.   Musculoskeletal: Negative.   Neurological: Negative.   Psychiatric/Behavioral: Negative.      Per HPI unless specifically indicated above     Objective:    BP 112/71 (BP Location: Left Arm, Patient Position: Sitting, Cuff Size: Normal)   Pulse 74   Temp 98.8 F (37.1 C) (Oral)   Resp 16   Ht 5' 4.02" (1.626 m)   Wt 178 lb 12.8 oz (81.1 kg)   SpO2 96%   BMI 30.68 kg/m   Wt Readings from Last 3 Encounters:  07/02/23 178 lb 12.8 oz (81.1 kg)  06/18/23 176 lb 3.2 oz (79.9 kg)  02/18/23 174 lb 3.2 oz (79 kg)    Physical Exam Vitals and nursing note reviewed.  Constitutional:      General: She is not in acute distress.    Appearance: Normal appearance. She is not ill-appearing, toxic-appearing or diaphoretic.  HENT:     Head: Normocephalic and atraumatic.     Right Ear: External ear normal.     Left Ear: External ear normal.     Nose: Nose normal.     Mouth/Throat:     Mouth:  Mucous membranes are moist.     Pharynx: Oropharynx is clear.  Eyes:     General: No scleral icterus.       Right eye: No discharge.        Left eye: No discharge.     Extraocular Movements: Extraocular movements intact.     Conjunctiva/sclera: Conjunctivae normal.     Pupils: Pupils are equal, round, and reactive to light.  Cardiovascular:     Rate and Rhythm: Normal rate and regular rhythm.     Pulses: Normal pulses.     Heart sounds: Normal heart sounds. No murmur heard.    No friction rub. No gallop.  Pulmonary:     Effort: Pulmonary effort is normal. No respiratory distress.     Breath sounds: Normal breath sounds. No stridor. No wheezing, rhonchi or rales.  Chest:     Chest wall: No tenderness.  Musculoskeletal:        General: Normal range of motion.     Cervical back: Normal range of motion and neck supple.  Skin:    General: Skin is warm and dry.     Capillary Refill: Capillary refill takes less than 2 seconds.     Coloration:  Skin is not jaundiced or pale.     Findings: No bruising, erythema, lesion or rash.  Neurological:     General: No focal deficit present.     Mental Status: She is alert and oriented to person, place, and time. Mental status is at baseline.  Psychiatric:        Mood and Affect: Mood normal.        Behavior: Behavior normal.        Thought Content: Thought content normal.        Judgment: Judgment normal.     Results for orders placed or performed in visit on 06/18/23  Comprehensive metabolic panel   Collection Time: 06/18/23  8:36 AM  Result Value Ref Range   Glucose 100 (H) 70 - 99 mg/dL   BUN 13 8 - 27 mg/dL   Creatinine, Ser 9.14 0.57 - 1.00 mg/dL   eGFR 76 >78 GN/FAO/1.30   BUN/Creatinine Ratio 15 12 - 28   Sodium 143 134 - 144 mmol/L   Potassium 3.8 3.5 - 5.2 mmol/L   Chloride 105 96 - 106 mmol/L   CO2 22 20 - 29 mmol/L   Calcium 9.7 8.7 - 10.3 mg/dL   Total Protein 6.9 6.0 - 8.5 g/dL   Albumin 4.5 3.9 - 4.9 g/dL   Globulin,  Total 2.4 1.5 - 4.5 g/dL   Bilirubin Total 0.4 0.0 - 1.2 mg/dL   Alkaline Phosphatase 94 44 - 121 IU/L   AST 15 0 - 40 IU/L   ALT 16 0 - 32 IU/L  Lipid Panel w/o Chol/HDL Ratio   Collection Time: 06/18/23  8:36 AM  Result Value Ref Range   Cholesterol, Total 167 100 - 199 mg/dL   Triglycerides 865 (H) 0 - 149 mg/dL   HDL 48 >78 mg/dL   VLDL Cholesterol Cal 29 5 - 40 mg/dL   LDL Chol Calc (NIH) 90 0 - 99 mg/dL      Assessment & Plan:   Problem List Items Addressed This Visit   None Visit Diagnoses       Pneumonia of right lower lobe due to infectious organism    -  Primary   Resolved. Lungs clear. No concerns.        Follow up plan: Return in about 6 months (around 01/02/2024) for physical.

## 2023-07-09 DIAGNOSIS — C3490 Malignant neoplasm of unspecified part of unspecified bronchus or lung: Secondary | ICD-10-CM

## 2023-07-09 HISTORY — DX: Malignant neoplasm of unspecified part of unspecified bronchus or lung: C34.90

## 2023-08-03 ENCOUNTER — Emergency Department
Admission: EM | Admit: 2023-08-03 | Discharge: 2023-08-03 | Disposition: A | Discharge status: Home / Self Care | Attending: Emergency Medicine | Admitting: Emergency Medicine

## 2023-08-03 ENCOUNTER — Emergency Department

## 2023-08-03 ENCOUNTER — Other Ambulatory Visit: Payer: Self-pay

## 2023-08-03 DIAGNOSIS — I10 Essential (primary) hypertension: Secondary | ICD-10-CM | POA: Insufficient documentation

## 2023-08-03 DIAGNOSIS — J188 Other pneumonia, unspecified organism: Secondary | ICD-10-CM | POA: Insufficient documentation

## 2023-08-03 DIAGNOSIS — R918 Other nonspecific abnormal finding of lung field: Secondary | ICD-10-CM | POA: Diagnosis not present

## 2023-08-03 DIAGNOSIS — R042 Hemoptysis: Secondary | ICD-10-CM | POA: Insufficient documentation

## 2023-08-03 DIAGNOSIS — J189 Pneumonia, unspecified organism: Secondary | ICD-10-CM

## 2023-08-03 DIAGNOSIS — R509 Fever, unspecified: Secondary | ICD-10-CM | POA: Diagnosis present

## 2023-08-03 HISTORY — DX: Pneumonia, unspecified organism: J18.9

## 2023-08-03 HISTORY — DX: Essential (primary) hypertension: I10

## 2023-08-03 LAB — CBC WITH DIFFERENTIAL/PLATELET
Abs Immature Granulocytes: 0.05 10*3/uL (ref 0.00–0.07)
Basophils Absolute: 0.1 10*3/uL (ref 0.0–0.1)
Basophils Relative: 1 %
Eosinophils Absolute: 0.2 10*3/uL (ref 0.0–0.5)
Eosinophils Relative: 2 %
HCT: 39.2 % (ref 36.0–46.0)
Hemoglobin: 13.1 g/dL (ref 12.0–15.0)
Immature Granulocytes: 1 %
Lymphocytes Relative: 23 %
Lymphs Abs: 2.4 10*3/uL (ref 0.7–4.0)
MCH: 27.9 pg (ref 26.0–34.0)
MCHC: 33.4 g/dL (ref 30.0–36.0)
MCV: 83.4 fL (ref 80.0–100.0)
Monocytes Absolute: 0.8 10*3/uL (ref 0.1–1.0)
Monocytes Relative: 7 %
Neutro Abs: 7.1 10*3/uL (ref 1.7–7.7)
Neutrophils Relative %: 66 %
Platelets: 316 10*3/uL (ref 150–400)
RBC: 4.7 MIL/uL (ref 3.87–5.11)
RDW: 12.5 % (ref 11.5–15.5)
WBC: 10.6 10*3/uL — ABNORMAL HIGH (ref 4.0–10.5)
nRBC: 0 % (ref 0.0–0.2)

## 2023-08-03 LAB — COMPREHENSIVE METABOLIC PANEL WITH GFR
ALT: 22 U/L (ref 0–44)
AST: 19 U/L (ref 15–41)
Albumin: 4.5 g/dL (ref 3.5–5.0)
Alkaline Phosphatase: 73 U/L (ref 38–126)
Anion gap: 10 (ref 5–15)
BUN: 14 mg/dL (ref 8–23)
CO2: 23 mmol/L (ref 22–32)
Calcium: 9.5 mg/dL (ref 8.9–10.3)
Chloride: 104 mmol/L (ref 98–111)
Creatinine, Ser: 0.8 mg/dL (ref 0.44–1.00)
GFR, Estimated: >60 mL/min (ref >60)
Glucose, Bld: 101 mg/dL — ABNORMAL HIGH (ref 70–99)
Potassium: 3.7 mmol/L (ref 3.5–5.1)
Sodium: 137 mmol/L (ref 135–145)
Total Bilirubin: 0.7 mg/dL (ref 0.0–1.2)
Total Protein: 7.5 g/dL (ref 6.5–8.1)

## 2023-08-03 LAB — TROPONIN I (HIGH SENSITIVITY): Troponin I (High Sensitivity): <2 ng/L (ref <18)

## 2023-08-03 LAB — RESP PANEL BY RT-PCR (RSV, FLU A&B, COVID)  RVPGX2
Influenza A by PCR: NEGATIVE
Influenza B by PCR: NEGATIVE
Resp Syncytial Virus by PCR: NEGATIVE
SARS Coronavirus 2 by RT PCR: NEGATIVE

## 2023-08-03 LAB — LACTIC ACID, PLASMA: Lactic Acid, Venous: 1 mmol/L (ref 0.5–1.9)

## 2023-08-03 MED ORDER — IOHEXOL 350 MG/ML SOLN
75.0000 mL | Freq: Once | INTRAVENOUS | Status: AC | PRN
  Administered 2023-08-03: 75 mL via INTRAVENOUS

## 2023-08-03 MED ORDER — AZITHROMYCIN 250 MG PO TABS: ORAL_TABLET | ORAL | 0 refills | Status: AC

## 2023-08-03 MED ORDER — AMOXICILLIN-POT CLAVULANATE 875-125 MG PO TABS: 1.0000 | ORAL_TABLET | Freq: Two times a day (BID) | ORAL | 0 refills | Status: AC

## 2023-08-03 NOTE — ED Notes (Signed)
 Pt verbalizes understanding of discharge instructions.

## 2023-08-03 NOTE — ED Provider Notes (Signed)
 [facility] Provider Note    Event Date/Time   First MD Initiated Contact with Patient 08/03/23 1520     (approximate)   History   Chief Complaint coughing blood   HPI  Tanya Howell is a 63 y.o. female with past medical history of hypertension and hyperlipidemia who presents to the ED complaining of hemoptysis.  Patient reports that she has had a lingering cough for the past month after being treated for pneumonia with doxycycline  about a month ago.  She denies any difficulty breathing and has not had pain in her chest, did not become concerned until she coughed up some blood just prior to arrival.  She does not take a blood thinner and denies any history of DVT/PE.  She does report feeling feverish with chills 2 nights ago, has not had any fevers since then.  She has not noticed any pain or swelling in her legs.     Physical Exam   Triage Vital Signs: ED Triage Vitals  Encounter Vitals Group     BP 08/03/23 1422 (!) 143/87     Systolic BP Percentile --      Diastolic BP Percentile --      Pulse Rate 08/03/23 1422 (!) 106     Resp 08/03/23 1422 20     Temp 08/03/23 1422 98.7 F (37.1 C)     Temp Source 08/03/23 1422 Oral     SpO2 08/03/23 1422 96 %     Weight 08/03/23 1423 172 lb (78 kg)     Height 08/03/23 1423 5\' 4"  (1.626 m)     Head Circumference --      Peak Flow --      Pain Score 08/03/23 1421 0     Pain Loc --      Pain Education --      Exclude from Growth Chart --     Most recent vital signs: Vitals:   08/03/23 1422 08/03/23 1748  BP: (!) 143/87 134/72  Pulse: (!) 106 98  Resp: 20 16  Temp: 98.7 F (37.1 C) 98.1 F (36.7 C)  SpO2: 96% 94%    Constitutional: Alert and oriented. Eyes: Conjunctivae are normal. Head: Atraumatic. Nose: No congestion/rhinnorhea. Mouth/Throat: Mucous membranes are moist.  Cardiovascular: Normal rate, regular rhythm. Grossly normal heart sounds.  2+ radial pulses bilaterally. Respiratory:  Normal respiratory effort.  No retractions. Lungs CTAB.  No chest wall tenderness to palpation noted. Gastrointestinal: Soft and nontender. No distention. Musculoskeletal: No lower extremity tenderness nor edema.  Neurologic:  Normal speech and language. No gross focal neurologic deficits are appreciated.    ED Results / Procedures / Treatments   Labs (all labs ordered are listed, but only abnormal results are displayed) Labs Reviewed  COMPREHENSIVE METABOLIC PANEL WITH GFR - Abnormal; Notable for the following components:      Result Value   Glucose, Bld 101 (*)    All other components within normal limits  CBC WITH DIFFERENTIAL/PLATELET - Abnormal; Notable for the following components:   WBC 10.6 (*)    All other components within normal limits  RESP PANEL BY RT-PCR (RSV, FLU A&B, COVID)  [id number]  LACTIC ACID, PLASMA  TROPONIN I (HIGH SENSITIVITY)     EKG  ED ECG REPORT I, Twilla Galea, the attending physician, personally viewed and interpreted this ECG.   Date: 08/03/2023  EKG Time: 16:22  Rate: 97  Rhythm: normal sinus rhythm  Axis: Normal  Intervals:none  ST&T Change: None  RADIOLOGY CTA chest reviewed and interpreted by me with mass at the right hilum, no pulmonary embolism noted.  PROCEDURES:  Critical Care performed: No  Procedures   MEDICATIONS ORDERED IN ED: Medications  iohexol (OMNIPAQUE) 350 MG/ML injection 75 mL (75 mLs Intravenous Contrast Given 08/03/23 1649)     IMPRESSION / MDM / ASSESSMENT AND PLAN / ED COURSE  I reviewed the triage vital signs and the nursing notes.                              63 y.o. female with past medical history of hypertension and hyperlipidemia who presents to the ED complaining of coughing up blood today after a lingering cough for the past month.  Patient's presentation is most consistent with acute presentation with potential threat to life or bodily function.  Differential diagnosis includes, but is not  limited to, pneumonia, bronchitis, COPD, pneumothorax, PE, malignancy, anemia, electrolyte abnormality, AKI.  Patient nontoxic-appearing and in no acute distress, vital signs are remarkable for tachycardia but otherwise reassuring.  Patient not coughing up any additional blood here in the ED, labs are reassuring with no significant anemia, leukocytosis, electrolyte abnormality, or AKI.  Troponin within normal limits and LFTs are unremarkable.  Plan to further assess with CTA chest for residual pneumonia versus pulmonary embolism.  CTA chest is negative for PE but does show right hilar mass with associated lymphadenopathy concerning for malignancy.  Patient also with evidence of postobstructive pneumonia but remains hemodynamically stable with no ongoing hemoptysis.  Findings discussed with Dr. Wilhelmenia Harada of oncology, who will assist with outpatient follow-up later this week.  We will treat pneumonia with Augmentin and azithromycin, patient counseled to return to the ED for new or worsening symptoms.  Patient and spouse agree with plan.      FINAL CLINICAL IMPRESSION(S) / ED DIAGNOSES   Final diagnoses:  Lung mass  Hemoptysis  Obstructive pneumonia     Rx / DC Orders   ED Discharge Orders          Ordered    amoxicillin-clavulanate (AUGMENTIN) 875-125 MG tablet  2 times daily        08/03/23 1931    azithromycin (ZITHROMAX Z-PAK) 250 MG tablet        08/03/23 1931             Note:  This document was prepared using Dragon voice recognition software and may include unintentional dictation errors.   Twilla Galea, MD 08/03/23 [phone]

## 2023-08-03 NOTE — ED Triage Notes (Signed)
 Pt to ED for intractable cough since 6-8 weeks ago and bright red blood in sputum since today after having a cvoughing spell.   Pt had PNA 2 weeks ago, treated, but cough has never resolved. States had fever 2 nights ago.   Skin dry, not coughing in triage.

## 2023-08-05 ENCOUNTER — Encounter: Payer: Self-pay | Admitting: *Deleted

## 2023-08-05 ENCOUNTER — Ambulatory Visit: Admitting: Nurse Practitioner

## 2023-08-05 DIAGNOSIS — R918 Other nonspecific abnormal finding of lung field: Secondary | ICD-10-CM

## 2023-08-05 NOTE — Progress Notes (Signed)
 Referral received from ED per Dr. Wilhelmenia Harada. Pt needs further workup for lung mass and to establish care with oncology and pulmonology. Referral for pulmonology has been placed. Message sent to referral coordinator to schedule appts and notify pt. Order for PET scan placed. Pt will be notified with appts once scheduled.

## 2023-08-06 ENCOUNTER — Encounter: Payer: Self-pay | Admitting: *Deleted

## 2023-08-06 ENCOUNTER — Inpatient Hospital Stay

## 2023-08-06 ENCOUNTER — Encounter: Payer: Self-pay | Admitting: Oncology

## 2023-08-06 ENCOUNTER — Inpatient Hospital Stay: Attending: Oncology | Admitting: Oncology

## 2023-08-06 VITALS — BP 147/68 | HR 93 | Temp 98.8°F | Resp 18 | Wt 173.8 lb

## 2023-08-06 DIAGNOSIS — Z803 Family history of malignant neoplasm of breast: Secondary | ICD-10-CM | POA: Insufficient documentation

## 2023-08-06 DIAGNOSIS — R042 Hemoptysis: Secondary | ICD-10-CM | POA: Diagnosis not present

## 2023-08-06 DIAGNOSIS — R918 Other nonspecific abnormal finding of lung field: Secondary | ICD-10-CM | POA: Diagnosis present

## 2023-08-06 DIAGNOSIS — Z87891 Personal history of nicotine dependence: Secondary | ICD-10-CM | POA: Diagnosis not present

## 2023-08-06 DIAGNOSIS — Z800 Family history of malignant neoplasm of digestive organs: Secondary | ICD-10-CM | POA: Diagnosis not present

## 2023-08-06 DIAGNOSIS — C3491 Malignant neoplasm of unspecified part of right bronchus or lung: Secondary | ICD-10-CM | POA: Insufficient documentation

## 2023-08-06 NOTE — Progress Notes (Signed)
 Met with patient during initial consult with Dr. Wilhelmenia Harada. All questions answered during visit. Reviewed upcoming appts. Informed that will schedule for next follow up about 1 week after her biopsy once it is scheduled. Contact info given and instructed to call with any questions or needs. Pt verbalized understanding.

## 2023-08-06 NOTE — Progress Notes (Signed)
 Hematology/Oncology Consult Note [phone] [phone]     REFERRING PROVIDER: Solomon Dupre, DO    CHIEF COMPLAINTS/PURPOSE OF CONSULTATION:  Lung mass  ASSESSMENT & PLAN:   Lung mass CT images were reviewed with the patient. Right hilar mass with airway obstruction.  Suspicious for primary lung neoplasm. Will refer patient to pulmonology for tissue biopsy via bronchoscopy. Check PET scan, brain MRI with and without contrast. Future treatment plan depends on additional imaging and tissue diagnosis.  Hemoptysis Small amount of hemoptysis.  No anemia on recent CBC. Discussed with patient about ER triggers   Orders Placed This Encounter  Procedures   MR Brain W Wo Contrast    Standing Status:   Future    Expected Date:   08/13/2023    Expiration Date:   08/05/2024    If indicated for the ordered procedure, I authorize the administration of contrast media per Radiology protocol:   Yes    What is the patient's sedation requirement?:   No Sedation    Does the patient have a pacemaker or implanted devices?:   No    Use SRS Protocol?:   No    Preferred imaging location?:   [facility] (table limit - 550lbs)   Follow-up to be determined. All questions were answered. The patient knows to call the clinic with any problems, questions or concerns.  Timmy Forbes, MD, PhD [facility] Hematology Oncology 08/06/2023    HISTORY OF PRESENTING ILLNESS:  Tanya Howell 63 y.o. female presents to establish care for lung cancer  08/03/2023, patient presented to emergency room for evaluation of hemoptysis. Patient had pneumonia in March 2025, she continues to have a lingering cough for the past months despite being treated with doxycycline  and prednisone . She coughed up small amount of bright red blood on 08/03/2023.  Denies any shortness of breath, chest pain unintentional weight loss headache.  She felt feverish with chills 2 nights ago. Patient is a former  smoker, quit smoking in 2011. Patient denies being on any antiplatelet or anticoagulation agents.  08/03/2023, CT angiogram chest PE protocol showed  1. 3.5 x 2.6 cm right hilar mass, with obstruction of the bronchus intermedius, right middle lobe bronchus, and portions of the right lower lobe bronchus as above, highly concerning for neoplasm. Bronchoscopy is recommended for further evaluation. 2. Subcarinal adenopathy concerning for metastatic disease. 3. Dense medial segment consolidation within the right middle lobe with associated volume loss, consistent with atelectasis. 4. Prominent right lower lobe bronchiectasis, with fluid-filled bronchi and patchy right basilar consolidation consistent with combination of airspace disease and atelectasis. 5. No evidence of pulmonary embolus. 6. Aortic Atherosclerosis (ICD10-I70.0). Coronary artery atherosclerosis.  Today she is accompanied by husband for evaluation and establish care.  MEDICAL HISTORY:  Past Medical History:  Diagnosis Date   COVID-19 07/2018   Hyperlipidemia    Hypertension    Motion sickness    amuesment park rides   Osteopenia 11/01/2015   Pneumonia    april 2025   Psoriasis    Vertigo    none for over 5 yrs   Wears dentures    full upper and lower    SURGICAL HISTORY: Past Surgical History:  Procedure Laterality Date   CHOLECYSTECTOMY  1990   COLONOSCOPY WITH PROPOFOL  N/A 07/15/2020   Procedure: COLONOSCOPY WITH PROPOFOL ;  Surgeon: Marnee Sink, MD;  Location: [facility];  Service: Endoscopy;  Laterality: N/A;  priority 4   TUBAL LIGATION      SOCIAL  HISTORY: Social History   Socioeconomic History   Marital status: Married    Spouse name: Lavonia Powers   Number of children: 1   Years of education: Not on file   Highest education level: High school graduate  Occupational History   Not on file  Tobacco Use   Smoking status: Former    Current packs/day: 0.00    Average packs/day: 1 pack/day for  28.0 years (28.0 ttl pk-yrs)    Types: Cigarettes    Start date: 39    Quit date: 2011    Years since quitting: 14.3   Smokeless tobacco: Never  Vaping Use   Vaping status: Former  Substance and Sexual Activity   Alcohol use: No    Alcohol/week: 0.0 standard drinks of alcohol    Comment: rare   Drug use: No   Sexual activity: Yes    Birth control/protection: Surgical  Other Topics Concern   Not on file  Social History Narrative   Not on file   Social Drivers of Health   Financial Resource Strain: Low Risk  (05/06/2018)   Overall Financial Resource Strain (CARDIA)    Difficulty of Paying Living Expenses: Not hard at all  Food Insecurity: No Food Insecurity (05/06/2018)   Hunger Vital Sign    Worried About Running Out of Food in the Last Year: Never true    Ran Out of Food in the Last Year: Never true  Transportation Needs: No Transportation Needs (11/10/2019)   PRAPARE - Administrator, Civil Service (Medical): No    Lack of Transportation (Non-Medical): No  Physical Activity: Sufficiently Active (05/06/2018)   Exercise Vital Sign    Days of Exercise per Week: 7 days    Minutes of Exercise per Session: 30 min  Stress: No Stress Concern Present (05/06/2018)   [employer] of Occupational Health - Occupational Stress Questionnaire    Feeling of Stress : Not at all  Social Connections: Somewhat Isolated (05/06/2018)   Social Connection and Isolation Panel [NHANES]    Frequency of Communication with Friends and Family: More than three times a week    Frequency of Social Gatherings with Friends and Family: Twice a week    Attends Religious Services: Never    Database administrator or Organizations: No    Attends Banker Meetings: Never    Marital Status: Married  Catering manager Violence: Not At Risk (11/10/2019)   Humiliation, Afraid, Rape, and Kick questionnaire    Fear of Current or Ex-Partner: No    Emotionally Abused: No    Physically Abused:  No    Sexually Abused: No    FAMILY HISTORY: Family History  Problem Relation Age of Onset   Colon cancer Brother        21    Breast cancer Mother    Breast cancer Maternal Aunt    Pneumonia Father    Diabetes Maternal Grandmother     ALLERGIES:  is allergic to lisinopril .  MEDICATIONS:  Current Outpatient Medications  Medication Sig Dispense Refill   amoxicillin-clavulanate (AUGMENTIN) 875-125 MG tablet Take 1 tablet by mouth 2 (two) times daily for 7 days. 14 tablet 0   atorvastatin  (LIPITOR) 10 MG tablet TAKE 1 TABLET BY MOUTH AT BEDTIME 90 tablet 1   azithromycin (ZITHROMAX Z-PAK) 250 MG tablet Take 2 tablets (500 mg) on  Day 1,  followed by 1 tablet (250 mg) once daily on Days 2 through 5. 6 each 0   losartan  (COZAAR )  25 MG tablet Take 0.5 tablets (12.5 mg total) by mouth daily. 45 tablet 1   meclizine  (ANTIVERT ) 25 MG tablet Take 1 tablet (25 mg total) by mouth 3 (three) times daily as needed for dizziness. 30 tablet 3   triamcinolone  ointment (KENALOG ) 0.5 % Apply 1 Application topically 2 (two) times daily. 30 g 6   predniSONE  (DELTASONE ) 10 MG tablet 6 tabs in the AM day 1, 5 tabs day 2 decrease by 1 every day until gone (Patient not taking: Reported on 08/06/2023) 21 tablet 0   No current facility-administered medications for this visit.    Review of Systems  Constitutional:  Negative for appetite change, chills, fatigue and fever.  HENT:   Negative for hearing loss and voice change.   Eyes:  Negative for eye problems.  Respiratory:  Positive for cough and hemoptysis. Negative for chest tightness.   Cardiovascular:  Negative for chest pain.  Gastrointestinal:  Negative for abdominal distention, abdominal pain and blood in stool.  Endocrine: Negative for hot flashes.  Genitourinary:  Negative for difficulty urinating and frequency.   Musculoskeletal:  Negative for arthralgias.  Skin:  Negative for itching and rash.  Neurological:  Negative for extremity weakness.   Hematological:  Negative for adenopathy.  Psychiatric/Behavioral:  Negative for confusion.      PHYSICAL EXAMINATION: ECOG PERFORMANCE STATUS: 0 - Asymptomatic  Vitals:   08/06/23 1456  BP: (!) 147/68  Pulse: 93  Resp: 18  Temp: 98.8 F (37.1 C)  SpO2: 97%   Filed Weights   08/06/23 1456  Weight: 173 lb 12.8 oz (78.8 kg)    Physical Exam Constitutional:      General: She is not in acute distress.    Appearance: She is not diaphoretic.  HENT:     Head: Normocephalic and atraumatic.  Eyes:     General: No scleral icterus. Cardiovascular:     Rate and Rhythm: Normal rate and regular rhythm.     Heart sounds: No murmur heard. Pulmonary:     Effort: Pulmonary effort is normal. No respiratory distress.     Breath sounds: No wheezing.  Abdominal:     General: There is no distension.     Palpations: Abdomen is soft.     Tenderness: There is no abdominal tenderness.  Musculoskeletal:        General: Normal range of motion.     Cervical back: Normal range of motion and neck supple.  Skin:    General: Skin is warm and dry.     Findings: No erythema.  Neurological:     Mental Status: She is alert and oriented to person, place, and time. Mental status is at baseline.     Motor: No abnormal muscle tone.  Psychiatric:        Mood and Affect: Affect normal.      LABORATORY DATA:  I have reviewed the data as listed    Latest Ref Rng & Units 08/03/2023    2:26 PM 11/13/2022    3:53 PM 10/12/2021   12:13 AM  CBC  WBC 4.0 - 10.5 K/uL 10.6  7.0  9.1   Hemoglobin 12.0 - 15.0 g/dL 16.1  09.6  04.5   Hematocrit 36.0 - 46.0 % 39.2  41.2  40.6   Platelets 150 - 400 K/uL 316  225  287       Latest Ref Rng & Units 08/03/2023    2:26 PM 06/18/2023    8:36 AM 02/18/2023  4:19 PM  CMP  Glucose 70 - 99 mg/dL 045  409  92   BUN 8 - 23 mg/dL 14  13  11    Creatinine 0.44 - 1.00 mg/dL 8.11  9.14  7.82   Sodium 135 - 145 mmol/L 137  143  142   Potassium 3.5 - 5.1 mmol/L 3.7  3.8   3.7   Chloride 98 - 111 mmol/L 104  105  105   CO2 22 - 32 mmol/L 23  22  24    Calcium  8.9 - 10.3 mg/dL 9.5  9.7  9.6   Total Protein 6.5 - 8.1 g/dL 7.5  6.9    Total Bilirubin 0.0 - 1.2 mg/dL 0.7  0.4    Alkaline Phos 38 - 126 U/L 73  94    AST 15 - 41 U/L 19  15    ALT 0 - 44 U/L 22  16       RADIOGRAPHIC STUDIES: I have personally reviewed the radiological images as listed and agreed with the findings in the report. CT Angio Chest PE W/Cm &/Or Wo Cm Result Date: 08/03/2023 CLINICAL DATA:  Intractable cough for 6-8 weeks, hemoptysis today, history of pneumonia EXAM: CT ANGIOGRAPHY CHEST WITH CONTRAST TECHNIQUE: Multidetector CT imaging of the chest was performed using the standard protocol during bolus administration of intravenous contrast. Multiplanar CT image reconstructions and MIPs were obtained to evaluate the vascular anatomy. RADIATION DOSE REDUCTION: This exam was performed according to the departmental dose-optimization program which includes automated exposure control, adjustment of the mA and/or kV according to patient size and/or use of iterative reconstruction technique. CONTRAST:  75mL OMNIPAQUE IOHEXOL 350 MG/ML SOLN COMPARISON:  08/03/2023 FINDINGS: Cardiovascular: This is a technically adequate evaluation of the pulmonary vasculature. No filling defects or pulmonary emboli. The heart is unremarkable without pericardial effusion. No evidence of thoracic aortic aneurysm or dissection. Atherosclerosis of the aorta and coronary vasculature. Mediastinum/Nodes: Right hilar mass measuring 3.5 x 2.6 cm reference image 60/4 may reflect central obstructing neoplasm versus metastatic adenopathy. Pathologic adenopathy is seen in the subcarinal station measuring up to 1.9 cm in short axis. No contralateral hilar adenopathy. Thyroid, trachea, and esophagus appear unremarkable. Lungs/Pleura: Central obstructing soft tissue mass at the right hilum as above, with cut off of segments of the bronchus  intermedius and right lower lobe bronchus, and complete cut off of the right middle lobe bronchus. There is dense consolidation and volume loss primarily within the medial segment of the right middle lobe consistent with postobstructive atelectasis. Prominent bronchiectasis identified in the right lower lobe, with fluid-filled right lower lobe bronchi and patchy right lower lobe consolidation consistent with atelectasis or airspace disease. The left chest is clear.  No effusion or pneumothorax. Upper Abdomen: No acute abnormality. Musculoskeletal: No acute or destructive bony abnormalities. Reconstructed images demonstrate no additional findings. Review of the MIP images confirms the above findings. IMPRESSION: 1. 3.5 x 2.6 cm right hilar mass, with obstruction of the bronchus intermedius, right middle lobe bronchus, and portions of the right lower lobe bronchus as above, highly concerning for neoplasm. Bronchoscopy is recommended for further evaluation. 2. Subcarinal adenopathy concerning for metastatic disease. 3. Dense medial segment consolidation within the right middle lobe with associated volume loss, consistent with atelectasis. 4. Prominent right lower lobe bronchiectasis, with fluid-filled bronchi and patchy right basilar consolidation consistent with combination of airspace disease and atelectasis. 5. No evidence of pulmonary embolus. 6. Aortic Atherosclerosis (ICD10-I70.0). Coronary artery atherosclerosis. Electronically Signed   By: Bambi Lever  Bevin Bucks M.D.   On: 08/03/2023 18:57   DG Chest 2 View Result Date: 08/03/2023 CLINICAL DATA:  Lingering cough after pneumonia 2 weeks ago EXAM: CHEST - 2 VIEW COMPARISON:  None Available. FINDINGS: Cholecystectomy. Midline trachea. Normal heart size. Atherosclerosis in the transverse aorta. No pleural effusion or pneumothorax. Right base airspace disease obscuring the right hemidiaphragm. Clear left lung. IMPRESSION: Right base airspace disease which could represent  residual pneumonia or atelectasis. CT is pending. Aortic Atherosclerosis (ICD10-I70.0). Electronically Signed   By: Lore Rode M.D.   On: 08/03/2023 16:28

## 2023-08-06 NOTE — Assessment & Plan Note (Signed)
 CT images were reviewed with the patient. Right hilar mass with airway obstruction.  Suspicious for primary lung neoplasm. Will refer patient to pulmonology for tissue biopsy via bronchoscopy. Check PET scan, brain MRI with and without contrast. Future treatment plan depends on additional imaging and tissue diagnosis.

## 2023-08-06 NOTE — Assessment & Plan Note (Addendum)
 Small amount of hemoptysis.  No anemia on recent CBC. Discussed with patient about ER triggers Avoid NSAIDs

## 2023-08-07 ENCOUNTER — Encounter: Payer: Self-pay | Admitting: Pulmonary Disease

## 2023-08-07 ENCOUNTER — Telehealth: Payer: Self-pay

## 2023-08-07 ENCOUNTER — Ambulatory Visit: Admitting: Pulmonary Disease

## 2023-08-07 ENCOUNTER — Other Ambulatory Visit: Payer: Self-pay

## 2023-08-07 ENCOUNTER — Encounter (HOSPITAL_COMMUNITY): Payer: Self-pay | Admitting: Pulmonary Disease

## 2023-08-07 VITALS — BP 124/82 | HR 103 | Temp 97.3°F | Ht 64.0 in | Wt 175.6 lb

## 2023-08-07 DIAGNOSIS — R059 Cough, unspecified: Secondary | ICD-10-CM | POA: Insufficient documentation

## 2023-08-07 DIAGNOSIS — Z87891 Personal history of nicotine dependence: Secondary | ICD-10-CM | POA: Diagnosis not present

## 2023-08-07 DIAGNOSIS — R918 Other nonspecific abnormal finding of lung field: Secondary | ICD-10-CM | POA: Diagnosis not present

## 2023-08-07 DIAGNOSIS — R052 Subacute cough: Secondary | ICD-10-CM | POA: Insufficient documentation

## 2023-08-07 MED ORDER — BENZONATATE 100 MG PO CAPS: 100.0000 mg | ORAL_CAPSULE | Freq: Two times a day (BID) | ORAL | 1 refills | Status: DC | PRN

## 2023-08-07 MED ORDER — LEVALBUTEROL TARTRATE 45 MCG/ACT IN AERO: 2.0000 | INHALATION_SPRAY | Freq: Four times a day (QID) | RESPIRATORY_TRACT | 3 refills | Status: AC | PRN

## 2023-08-07 NOTE — Telephone Encounter (Signed)
 Noted. Nothing further needed.

## 2023-08-07 NOTE — Telephone Encounter (Signed)
 No pa req   Ready to sch   nfn

## 2023-08-07 NOTE — Progress Notes (Signed)
 PCP - Solomon Dupre, DO  Cardiologist -   PPM/ICD - denies Device Orders - n/a Rep Notified - n/a  Chest x-ray - 08-03-23 EKG - 08-05-23 Stress Test - denies ECHO - denies Cardiac Cath - denies  DM- denies  Blood Thinner Instructions: denies Aspirin Instructions: n/a  ERAS Protcol - NPO  COVID TEST- n/a  Anesthesia review: no  Patient verbally denies any shortness of breath, fever, cough and chest pain during phone call   -------------  SDW INSTRUCTIONS given:  Your procedure is scheduled on Aug 08, 2023.  Report to [facility] Main Entrance "A" at 6:45 A.M., and check in at the Admitting office.  Call this number if you have problems the morning of surgery:  [phone]   Remember:  Do not eat  or drink after midnight the night before your surgery      Take these medicines the morning of surgery with A SIP OF WATER   amoxicillin-clavulanate (AUGMENTIN)  azithromycin (ZITHROMAX Z-PAK)  benzonatate (TESSALON PERLES)  levalbuterol (XOPENEX HFA)  meclizine  (ANTIVERT )    As of today, STOP taking any Aspirin (unless otherwise instructed by your surgeon) Aleve, Naproxen, Ibuprofen, Motrin, Advil, Goody's, BC's, all herbal medications, fish oil, and all vitamins.                      Do not wear jewelry, make up, or nail polish            Do not wear lotions, powders, perfumes/colognes, or deodorant.            Do not shave 48 hours prior to surgery.  Men may shave face and neck.            Do not bring valuables to the hospital.            [facility] is not responsible for any belongings or valuables.  Do NOT Smoke (Tobacco/Vaping) 24 hours prior to your procedure If you use a CPAP at night, you may bring all equipment for your overnight stay.   Contacts, glasses, dentures or bridgework may not be worn into surgery.      For patients admitted to the hospital, discharge time will be determined by your treatment team.   Patients discharged the day of surgery  will not be allowed to drive home, and someone needs to stay with them for 24 hours.    Special instructions:   [facility]- Preparing For Surgery  Before surgery, you can play an important role. Because skin is not sterile, your skin needs to be as free of germs as possible. You can reduce the number of germs on your skin by washing with CHG (chlorahexidine gluconate) Soap before surgery.  CHG is an antiseptic cleaner which kills germs and bonds with the skin to continue killing germs even after washing.    Oral Hygiene is also important to reduce your risk of infection.  Remember - BRUSH YOUR TEETH THE MORNING OF SURGERY WITH YOUR REGULAR TOOTHPASTE  Please do not use if you have an allergy to CHG or antibacterial soaps. If your skin becomes reddened/irritated stop using the CHG.  Do not shave (including legs and underarms) for at least 48 hours prior to first CHG shower. It is OK to shave your face.  Please follow these instructions carefully.   Shower the NIGHT BEFORE SURGERY and the MORNING OF SURGERY with DIAL Soap.   Pat yourself dry with a CLEAN TOWEL.  Wear CLEAN PAJAMAS  to bed the night before surgery  Place CLEAN SHEETS on your bed the night of your first shower and DO NOT SLEEP WITH PETS.   Day of Surgery: Please shower morning of surgery  Wear Clean/Comfortable clothing the morning of surgery Do not apply any deodorants/lotions.   Remember to brush your teeth WITH YOUR REGULAR TOOTHPASTE.   Questions were answered. Patient verbalized understanding of instructions.

## 2023-08-07 NOTE — Progress Notes (Signed)
 Synopsis: Referred in by Timmy Forbes, MD   Subjective:   PATIENT ID: Tanya Howell GENDER: female DOB: 05-19-1960, MRN: [id number]  Chief Complaint  Patient presents with   Consult    Dry Cough. No SOB or wheezing. CT chest results.    HPI Ms. Woolcott is a pleasant 63 year old female patient with a past medical history of hypertension, hyperlipidemia and history of tobacco use quit in 2011 presenting for evaluation of right hilar mass.  She started having cough with sputum production back in March 2025 and was prescribed a course of antibiotics with prednisone  however the coughing spell persisted and had 1 episode of hemoptysis which prompted her presentation to the ED on 0/26.  She underwent a chest x-ray that showed a right base airspace disease that prompted a CTA of the chest.  CTA of the chest 0/26/2025 showed 3.5 cm right hilar mass involving the bronchus intermedius in the right middle lobe bronchus and portions of the right lower lobe bronchi.  Highly suspicious for primary lung malignancy.  Besides a cough and 1 episode of hemoptysis she is asymptomatic denies any weight loss or loss of appetite.  Family history -she denies any lung cancer in the family.  Social history -ex-smoker quit in 2011 has 30 pack/year.  ROS All systems were reviewed and are negative except for the above.  Objective:   Vitals:   08/07/23 1401  BP: 124/82  Pulse: (!) 103  Temp: (!) 97.3 F (36.3 C)  SpO2: 97%  Weight: 175 lb 9.6 oz (79.7 kg)  Height: 5\' 4"  (1.626 m)   97% on  RA BMI Readings from Last 3 Encounters:  08/07/23 30.14 kg/m  08/06/23 29.83 kg/m  08/03/23 29.52 kg/m   Wt Readings from Last 3 Encounters:  08/07/23 175 lb 9.6 oz (79.7 kg)  08/06/23 173 lb 12.8 oz (78.8 kg)  08/03/23 172 lb (78 kg)    Physical Exam GEN: NAD, Healthy Appearing HEENT: Supple Neck, Reactive Pupils, EOMI  CVS: Normal S1, Normal S2, RRR, No murmurs or ES appreciated  Lungs: Diminished  air entry over the right hemithorax.  Abdomen: Soft, non tender, non distended, + BS  Extremities: Warm and well perfused, No edema  Skin: No suspicious lesions appreciated  Psych: Normal Affect  Ancillary Information   CBC    Component Value Date/Time   WBC 10.6 (H) 08/03/2023 1426   RBC 4.70 08/03/2023 1426   HGB 13.1 08/03/2023 1426   HGB 13.6 11/13/2022 1553   HCT 39.2 08/03/2023 1426   HCT 41.2 11/13/2022 1553   PLT 316 08/03/2023 1426   PLT 225 11/13/2022 1553   MCV 83.4 08/03/2023 1426   MCV 85 11/13/2022 1553   MCH 27.9 08/03/2023 1426   MCHC 33.4 08/03/2023 1426   RDW 12.5 08/03/2023 1426   RDW 13.0 11/13/2022 1553   LYMPHSABS 2.4 08/03/2023 1426   LYMPHSABS 1.7 11/13/2022 1553   MONOABS 0.8 08/03/2023 1426   EOSABS 0.2 08/03/2023 1426   EOSABS 0.1 11/13/2022 1553   BASOSABS 0.1 08/03/2023 1426   BASOSABS 0.1 11/13/2022 1553   Labs and imaging were reviewed     No data to display           Assessment & Plan:  Ms. Mulroney is a pleasant 63 year old female patient with a past medical history of hypertension, hyperlipidemia and history of tobacco use quit in 2011 presenting for evaluation of right hilar mass.  # Right hilar mass Suspicious for primary lung malignancy.  Pending PET scan and MRI.  I discussed our options with the patient and her husband including waiting for the PET scan and MRI results prior to proceeding with bronchoscopy however we elected to proceed with diagnostic bronchoscopy EBUS and TBNA tomorrow at [facility].  I clearly discussed the risks including bleeding possible infection that happen in less than 2% of the cases.  They relate understanding and are agreeable to proceed with the procedure.  []  Diagnostic bronchoscopy, EBUS-TBNA scheduled for 08/08/2023. []  Will also obtain PFTs.  Return in about 3 months (around 11/06/2023).  I spent 60 minutes caring for this patient today, including preparing to see the patient, obtaining a  medical history , reviewing a separately obtained history, performing a medically appropriate examination and/or evaluation, counseling and educating the patient/family/caregiver, ordering medications, tests, or procedures, documenting clinical information in the electronic health record, and independently interpreting results (not separately reported/billed) and communicating results to the patient/family/caregiver  Annitta Kindler, MD  Pulmonary Critical Care 08/07/2023 3:54 PM

## 2023-08-07 NOTE — Telephone Encounter (Signed)
 Bil- EBUS 08/08/2023 at 9:15am Lung Nodule [id number], [id number]  Please see bronch info.

## 2023-08-07 NOTE — H&P (View-Only) (Signed)
 Synopsis: Referred in by Timmy Forbes, MD   Subjective:   PATIENT ID: Tanya Howell GENDER: female DOB: 05-19-1960, MRN: [id number]  Chief Complaint  Patient presents with   Consult    Dry Cough. No SOB or wheezing. CT chest results.    HPI Tanya Howell is a pleasant 64 year old female patient with a past medical history of hypertension, hyperlipidemia and history of tobacco use quit in 2011 presenting for evaluation of right hilar mass.  She started having cough with sputum production back in March 2025 and was prescribed a course of antibiotics with prednisone  however the coughing spell persisted and had 1 episode of hemoptysis which prompted her presentation to the ED on 0/26.  She underwent a chest x-ray that showed a right base airspace disease that prompted a CTA of the chest.  CTA of the chest 0/26/2025 showed 3.5 cm right hilar mass involving the bronchus intermedius in the right middle lobe bronchus and portions of the right lower lobe bronchi.  Highly suspicious for primary lung malignancy.  Besides a cough and 1 episode of hemoptysis she is asymptomatic denies any weight loss or loss of appetite.  Family history -she denies any lung cancer in the family.  Social history -ex-smoker quit in 2011 has 30 pack/year.  ROS All systems were reviewed and are negative except for the above.  Objective:   Vitals:   08/07/23 1401  BP: 124/82  Pulse: (!) 103  Temp: (!) 97.3 F (36.3 C)  SpO2: 97%  Weight: 175 lb 9.6 oz (79.7 kg)  Height: 5\' 4"  (1.626 m)   97% on  RA BMI Readings from Last 3 Encounters:  08/07/23 30.14 kg/m  08/06/23 29.83 kg/m  08/03/23 29.52 kg/m   Wt Readings from Last 3 Encounters:  08/07/23 175 lb 9.6 oz (79.7 kg)  08/06/23 173 lb 12.8 oz (78.8 kg)  08/03/23 172 lb (78 kg)    Physical Exam GEN: NAD, Healthy Appearing HEENT: Supple Neck, Reactive Pupils, EOMI  CVS: Normal S1, Normal S2, RRR, No murmurs or ES appreciated  Lungs: Diminished  air entry over the right hemithorax.  Abdomen: Soft, non tender, non distended, + BS  Extremities: Warm and well perfused, No edema  Skin: No suspicious lesions appreciated  Psych: Normal Affect  Ancillary Information   CBC    Component Value Date/Time   WBC 10.6 (H) 08/03/2023 1426   RBC 4.70 08/03/2023 1426   HGB 13.1 08/03/2023 1426   HGB 13.6 11/13/2022 1553   HCT 39.2 08/03/2023 1426   HCT 41.2 11/13/2022 1553   PLT 316 08/03/2023 1426   PLT 225 11/13/2022 1553   MCV 83.4 08/03/2023 1426   MCV 85 11/13/2022 1553   MCH 27.9 08/03/2023 1426   MCHC 33.4 08/03/2023 1426   RDW 12.5 08/03/2023 1426   RDW 13.0 11/13/2022 1553   LYMPHSABS 2.4 08/03/2023 1426   LYMPHSABS 1.7 11/13/2022 1553   MONOABS 0.8 08/03/2023 1426   EOSABS 0.2 08/03/2023 1426   EOSABS 0.1 11/13/2022 1553   BASOSABS 0.1 08/03/2023 1426   BASOSABS 0.1 11/13/2022 1553   Labs and imaging were reviewed     No data to display           Assessment & Plan:  Tanya Howell is a pleasant 63 year old female patient with a past medical history of hypertension, hyperlipidemia and history of tobacco use quit in 2011 presenting for evaluation of right hilar mass.  # Right hilar mass Suspicious for primary lung malignancy.  Pending PET scan and MRI.  I discussed our options with the patient and her husband including waiting for the PET scan and MRI results prior to proceeding with bronchoscopy however we elected to proceed with diagnostic bronchoscopy EBUS and TBNA tomorrow at [facility].  I clearly discussed the risks including bleeding possible infection that happen in less than 2% of the cases.  They relate understanding and are agreeable to proceed with the procedure.  []  Diagnostic bronchoscopy, EBUS-TBNA scheduled for 08/08/2023. []  Will also obtain PFTs.  Return in about 3 months (around 11/06/2023).  I spent 60 minutes caring for this patient today, including preparing to see the patient, obtaining a  medical history , reviewing a separately obtained history, performing a medically appropriate examination and/or evaluation, counseling and educating the patient/family/caregiver, ordering medications, tests, or procedures, documenting clinical information in the electronic health record, and independently interpreting results (not separately reported/billed) and communicating results to the patient/family/caregiver  Annitta Kindler, MD  Pulmonary Critical Care 08/07/2023 3:54 PM

## 2023-08-08 ENCOUNTER — Ambulatory Visit (HOSPITAL_COMMUNITY)
Admission: RE | Admit: 2023-08-08 | Discharge: 2023-08-08 | Disposition: A | Discharge status: Home / Self Care | Attending: Pulmonary Disease | Admitting: Pulmonary Disease

## 2023-08-08 ENCOUNTER — Ambulatory Visit (HOSPITAL_COMMUNITY)

## 2023-08-08 ENCOUNTER — Ambulatory Visit (HOSPITAL_BASED_OUTPATIENT_CLINIC_OR_DEPARTMENT_OTHER): Payer: Self-pay | Admitting: Anesthesiology

## 2023-08-08 ENCOUNTER — Ambulatory Visit (HOSPITAL_COMMUNITY): Payer: Self-pay | Admitting: Anesthesiology

## 2023-08-08 ENCOUNTER — Encounter (HOSPITAL_COMMUNITY)
Admission: RE | Disposition: A | Discharge status: Home / Self Care | Payer: Self-pay | Source: Home / Self Care | Attending: Pulmonary Disease

## 2023-08-08 DIAGNOSIS — I10 Essential (primary) hypertension: Secondary | ICD-10-CM

## 2023-08-08 DIAGNOSIS — C3431 Malignant neoplasm of lower lobe, right bronchus or lung: Secondary | ICD-10-CM | POA: Diagnosis not present

## 2023-08-08 DIAGNOSIS — Z87891 Personal history of nicotine dependence: Secondary | ICD-10-CM | POA: Insufficient documentation

## 2023-08-08 DIAGNOSIS — Z79899 Other long term (current) drug therapy: Secondary | ICD-10-CM | POA: Insufficient documentation

## 2023-08-08 DIAGNOSIS — R059 Cough, unspecified: Secondary | ICD-10-CM | POA: Insufficient documentation

## 2023-08-08 DIAGNOSIS — R052 Subacute cough: Secondary | ICD-10-CM

## 2023-08-08 DIAGNOSIS — R918 Other nonspecific abnormal finding of lung field: Secondary | ICD-10-CM | POA: Diagnosis not present

## 2023-08-08 DIAGNOSIS — R042 Hemoptysis: Secondary | ICD-10-CM | POA: Diagnosis not present

## 2023-08-08 DIAGNOSIS — J189 Pneumonia, unspecified organism: Secondary | ICD-10-CM

## 2023-08-08 HISTORY — PX: ENDOBRONCHIAL ULTRASOUND: SHX5096

## 2023-08-08 HISTORY — PX: BRONCHIAL NEEDLE ASPIRATION BIOPSY: SHX5106

## 2023-08-08 HISTORY — PX: BRONCHIAL BIOPSY: SHX5109

## 2023-08-08 HISTORY — PX: BRONCHIAL BRUSHINGS: SHX5108

## 2023-08-08 SURGERY — ENDOBRONCHIAL ULTRASOUND (EBUS)
Anesthesia: General | Laterality: Bilateral

## 2023-08-08 MED ORDER — ONDANSETRON HCL 4 MG/2ML IJ SOLN
INTRAMUSCULAR | Status: DC | PRN
  Administered 2023-08-08: 4 mg via INTRAVENOUS

## 2023-08-08 MED ORDER — LIDOCAINE 2% (20 MG/ML) 5 ML SYRINGE
INTRAMUSCULAR | Status: DC | PRN
  Administered 2023-08-08: 60 mg via INTRAVENOUS

## 2023-08-08 MED ORDER — OXYCODONE HCL 5 MG PO TABS: 5.0000 mg | ORAL_TABLET | Freq: Once | ORAL | Status: DC | PRN

## 2023-08-08 MED ORDER — ONDANSETRON HCL 4 MG/2ML IJ SOLN: 4.0000 mg | Freq: Once | INTRAMUSCULAR | Status: DC | PRN

## 2023-08-08 MED ORDER — SUGAMMADEX SODIUM 200 MG/2ML IV SOLN
INTRAVENOUS | Status: DC | PRN
  Administered 2023-08-08: 157 mg via INTRAVENOUS

## 2023-08-08 MED ORDER — FENTANYL CITRATE (PF) 100 MCG/2ML IJ SOLN: 25.0000 ug | INTRAMUSCULAR | Status: DC | PRN

## 2023-08-08 MED ORDER — DEXAMETHASONE SODIUM PHOSPHATE 10 MG/ML IJ SOLN
INTRAMUSCULAR | Status: DC | PRN
  Administered 2023-08-08: 10 mg via INTRAVENOUS

## 2023-08-08 MED ORDER — IPRATROPIUM-ALBUTEROL 0.5-2.5 (3) MG/3ML IN SOLN
3.0000 mL | Freq: Once | RESPIRATORY_TRACT | Status: AC
  Administered 2023-08-08: 3 mL via RESPIRATORY_TRACT

## 2023-08-08 MED ORDER — SODIUM CHLORIDE (PF) 0.9 % IJ SOLN
PREFILLED_SYRINGE | INTRAMUSCULAR | Status: DC | PRN
  Administered 2023-08-08: 2 mL

## 2023-08-08 MED ORDER — ROCURONIUM BROMIDE 10 MG/ML (PF) SYRINGE
PREFILLED_SYRINGE | INTRAVENOUS | Status: DC | PRN
  Administered 2023-08-08: 50 mg via INTRAVENOUS

## 2023-08-08 MED ORDER — ALBUTEROL SULFATE HFA 108 (90 BASE) MCG/ACT IN AERS
INHALATION_SPRAY | RESPIRATORY_TRACT | Status: DC | PRN
  Administered 2023-08-08: 2 via RESPIRATORY_TRACT

## 2023-08-08 MED ORDER — LACTATED RINGERS IV SOLN: INTRAVENOUS | Status: DC

## 2023-08-08 MED ORDER — CHLORHEXIDINE GLUCONATE 0.12 % MT SOLN
15.0000 mL | Freq: Once | OROMUCOSAL | Status: AC
  Administered 2023-08-08: 15 mL via OROMUCOSAL
  Filled 2023-08-08: qty 15

## 2023-08-08 MED ORDER — PROPOFOL 10 MG/ML IV BOLUS
INTRAVENOUS | Status: DC | PRN
  Administered 2023-08-08: 150 mg via INTRAVENOUS

## 2023-08-08 MED ORDER — OXYCODONE HCL 5 MG/5ML PO SOLN: 5.0000 mg | Freq: Once | ORAL | Status: DC | PRN

## 2023-08-08 NOTE — Anesthesia Procedure Notes (Signed)
 Procedure Name: Intubation Date/Time: 08/08/2023 10:15 AM  Performed by: Robert Chimes, CRNAPre-anesthesia Checklist: Patient identified, Emergency Drugs available, Suction available and Patient being monitored Patient Re-evaluated:Patient Re-evaluated prior to induction Oxygen Delivery Method: Circle system utilized Preoxygenation: Pre-oxygenation with 100% oxygen Induction Type: IV induction Ventilation: Mask ventilation without difficulty Laryngoscope Size: Mac and 3 Grade View: Grade I Tube type: Oral Tube size: 8.5 mm Number of attempts: 1 Airway Equipment and Method: Stylet and Oral airway Placement Confirmation: ETT inserted through vocal cords under direct vision, positive ETCO2 and breath sounds checked- equal and bilateral Secured at: 23 cm Tube secured with: Tape Dental Injury: Teeth and Oropharynx as per pre-operative assessment

## 2023-08-08 NOTE — Anesthesia Postprocedure Evaluation (Signed)
 Anesthesia Post Note  Patient: MISCHELLE STOCKSTILL  Procedure(s) Performed: ENDOBRONCHIAL ULTRASOUND (EBUS) (Bilateral) BRONCHOSCOPY, WITH BRUSH BIOPSY BRONCHOSCOPY, WITH NEEDLE ASPIRATION BIOPSY BRONCHOSCOPY, WITH BIOPSY     Patient location during evaluation: PACU Anesthesia Type: General Level of consciousness: awake and alert Pain management: pain level controlled Vital Signs Assessment: post-procedure vital signs reviewed and stable Respiratory status: spontaneous breathing, nonlabored ventilation, respiratory function stable and patient connected to nasal cannula oxygen Cardiovascular status: blood pressure returned to baseline, stable and tachycardic Postop Assessment: no apparent nausea or vomiting Anesthetic complications: no   No notable events documented.  Last Vitals:  Vitals:   08/08/23 1130 08/08/23 1145  BP: 125/78 122/70  Pulse: (!) 109 (!) 104  Resp: 17 14  Temp:    SpO2: 92% 97%    Last Pain:  Vitals:   08/08/23 1130  TempSrc:   PainSc: 0-No pain                 Juventino Oppenheim

## 2023-08-08 NOTE — Op Note (Signed)
 Flexible and EBUS Bronchoscopy Procedure Note  AZANI STOCKARD  [id number]  1960-11-16  Date:08/08/23  Time:11:11 AM   Provider Performing:Jean-Pierre Cornellius Kropp   Procedure: Flexible bronchoscopy and EBUS Bronchoscopy  Indication(s) Right Bronchus Intermedius Mass  Consent Risks of the procedure as well as the alternatives and risks of each were explained to the patient and/or caregiver.  Consent for the procedure was obtained.  Anesthesia General Anesthesia  Time Out Verified patient identification, verified procedure, site/side was marked, verified correct patient position, special equipment/implants available, medications/allergies/relevant history reviewed, required imaging and test results available.  Sterile Technique Usual hand hygiene, masks, gowns, and gloves were used  Procedure Description Diagnostic bronchoscope advanced through endotracheal tube and into airway.  Airways were examined down to subsegmental level with findings noted below. The diagnostic bronchoscope was then removed and the EBUS bronchoscope was advanced into airway with stations 7, 4R and 11rs biopsied and sent for cell block,  The EBUS bronchoscope was removed after assuring no active bleeding from biopsy site.  Findings:  -- Carina appeared sharp without any lesions.  -- Normal Left main, LC2 and LC1. Noraml Left upper lobe and lingula mucosa. Normal LLL.  -- Right main was normal and right upper lobe was intact.  -- Endobronchial Mass involving the bronchus intermedius was noted obstructing the RML and RLL. This was brushed, needle biopsy and forceps biopsied multiple time. We intermittently had minimal bleeding that was controlled with cold saline. (See image below.)  Complications/Tolerance None; patient tolerated the procedure well. Chest X-ray is not needed post procedure.  EBL Minimal  Specimen(s) -- Station 7, 4R and 11rs biopsied. -- BI mass needle biopsy, forceps biopsy and brush.   -- BI wash and sent for cytology.   Annitta Kindler, MD [facility] 08/08/2023 11:17 AM    #Bronchus Intermedius Mass.     #Endobronchial Needle Biopsy - BI Mass.

## 2023-08-08 NOTE — Interval H&P Note (Signed)
 Patient is here for a diagnostic bronchoscopy and EBUS with TBNA. Adequate for procedure.   Annitta Kindler, MD [facility] 08/08/2023 9:59 AM

## 2023-08-08 NOTE — Anesthesia Preprocedure Evaluation (Addendum)
 Anesthesia Evaluation  Patient identified by MRN, date of birth, ID band Patient awake    Reviewed: Allergy & Precautions, NPO status , Patient's Chart, lab work & pertinent test results  History of Anesthesia Complications Negative for: history of anesthetic complications  Airway Mallampati: II  TM Distance: >3 FB Neck ROM: Full    Dental  (+) Lower Dentures, Upper Dentures   Pulmonary pneumonia, former smoker   Pulmonary exam normal        Cardiovascular hypertension, Pt. on medications Normal cardiovascular exam     Neuro/Psych  Vertigo   negative psych ROS   GI/Hepatic negative GI ROS, Neg liver ROS,,,  Endo/Other  negative endocrine ROS    Renal/GU negative Renal ROS     Musculoskeletal negative musculoskeletal ROS (+)    Abdominal   Peds  Hematology negative hematology ROS (+)   Anesthesia Other Findings   Reproductive/Obstetrics                             Anesthesia Physical Anesthesia Plan  ASA: 2  Anesthesia Plan: General   Post-op Pain Management: Minimal or no pain anticipated   Induction: Intravenous  PONV Risk Score and Plan: 3 and Treatment may vary due to age or medical condition, Ondansetron , Dexamethasone  and Midazolam  Airway Management Planned: Oral ETT  Additional Equipment: None  Intra-op Plan:   Post-operative Plan: Extubation in OR  Informed Consent: I have reviewed the patients History and Physical, chart, labs and discussed the procedure including the risks, benefits and alternatives for the proposed anesthesia with the patient or authorized representative who has indicated his/her understanding and acceptance.     Dental advisory given  Plan Discussed with: CRNA and Anesthesiologist  Anesthesia Plan Comments:        Anesthesia Quick Evaluation

## 2023-08-08 NOTE — Transfer of Care (Signed)
 Immediate Anesthesia Transfer of Care Note  Patient: Tanya Howell  Procedure(s) Performed: ENDOBRONCHIAL ULTRASOUND (EBUS) (Bilateral) BRONCHOSCOPY, WITH BRUSH BIOPSY BRONCHOSCOPY, WITH NEEDLE ASPIRATION BIOPSY BRONCHOSCOPY, WITH BIOPSY  Patient Location: PACU  Anesthesia Type:General  Level of Consciousness: awake, alert , and oriented  Airway & Oxygen Therapy: Patient Spontanous Breathing  Post-op Assessment: Report given to RN and Post -op Vital signs reviewed and stable  Post vital signs: Reviewed and stable  Last Vitals:  Vitals Value Taken Time  BP 124/84   Temp 98   Pulse 109 08/08/23 1110  Resp 16 08/08/23 1110  SpO2 94 % 08/08/23 1110  Vitals shown include unfiled device data.  Last Pain:  Vitals:   08/08/23 0734  TempSrc:   PainSc: 0-No pain         Complications: No notable events documented.

## 2023-08-09 ENCOUNTER — Ambulatory Visit
Admission: RE | Admit: 2023-08-09 | Discharge: 2023-08-09 | Disposition: A | Discharge status: Home / Self Care | Source: Ambulatory Visit | Attending: Oncology

## 2023-08-09 DIAGNOSIS — I6782 Cerebral ischemia: Secondary | ICD-10-CM | POA: Diagnosis not present

## 2023-08-09 DIAGNOSIS — I6523 Occlusion and stenosis of bilateral carotid arteries: Secondary | ICD-10-CM | POA: Insufficient documentation

## 2023-08-09 DIAGNOSIS — R918 Other nonspecific abnormal finding of lung field: Secondary | ICD-10-CM | POA: Insufficient documentation

## 2023-08-09 DIAGNOSIS — I700 Atherosclerosis of aorta: Secondary | ICD-10-CM | POA: Insufficient documentation

## 2023-08-09 LAB — CYTOLOGY - NON PAP

## 2023-08-09 LAB — GLUCOSE, CAPILLARY: Glucose-Capillary: 97 mg/dL (ref 70–99)

## 2023-08-09 MED ORDER — FLUDEOXYGLUCOSE F - 18 (FDG) INJECTION
9.6000 | Freq: Once | INTRAVENOUS | Status: AC | PRN
  Administered 2023-08-09: 9.6 via INTRAVENOUS

## 2023-08-12 ENCOUNTER — Encounter (HOSPITAL_COMMUNITY): Payer: Self-pay | Admitting: Pulmonary Disease

## 2023-08-12 ENCOUNTER — Encounter: Payer: Self-pay | Admitting: *Deleted

## 2023-08-12 NOTE — Progress Notes (Signed)
 Newly diagnosed adenocarcinoma of the lung. Per Dr. Wilhelmenia Harada, will need NGS to be ordered on recent biopsy sample. Order placed with [employer] xT, xR, and PDL1 on specimen [id number]. Will obtain financial assistance application at clinic visit this week.

## 2023-08-13 ENCOUNTER — Ambulatory Visit
Admission: RE | Admit: 2023-08-13 | Discharge: 2023-08-13 | Disposition: A | Discharge status: Home / Self Care | Source: Ambulatory Visit | Attending: Oncology | Admitting: Oncology

## 2023-08-13 DIAGNOSIS — R918 Other nonspecific abnormal finding of lung field: Secondary | ICD-10-CM

## 2023-08-13 MED ORDER — GADOBUTROL 1 MMOL/ML IV SOLN
7.0000 mL | Freq: Once | INTRAVENOUS | Status: AC | PRN
  Administered 2023-08-13: 7 mL via INTRAVENOUS

## 2023-08-13 NOTE — Addendum Note (Signed)
 Addended by: Drake Gens on: 08/13/2023 09:13 AM   Modules accepted: Orders

## 2023-08-14 LAB — CYTOLOGY - NON PAP

## 2023-08-15 ENCOUNTER — Inpatient Hospital Stay: Attending: Oncology | Admitting: Oncology

## 2023-08-15 ENCOUNTER — Encounter: Payer: Self-pay | Admitting: *Deleted

## 2023-08-15 ENCOUNTER — Encounter: Payer: Self-pay | Admitting: Oncology

## 2023-08-15 ENCOUNTER — Other Ambulatory Visit

## 2023-08-15 VITALS — BP 142/72 | HR 85 | Temp 99.1°F | Resp 16 | Wt 173.0 lb

## 2023-08-15 DIAGNOSIS — Z87891 Personal history of nicotine dependence: Secondary | ICD-10-CM | POA: Diagnosis not present

## 2023-08-15 DIAGNOSIS — Z800 Family history of malignant neoplasm of digestive organs: Secondary | ICD-10-CM | POA: Diagnosis not present

## 2023-08-15 DIAGNOSIS — Z803 Family history of malignant neoplasm of breast: Secondary | ICD-10-CM | POA: Diagnosis not present

## 2023-08-15 DIAGNOSIS — R042 Hemoptysis: Secondary | ICD-10-CM

## 2023-08-15 DIAGNOSIS — C3491 Malignant neoplasm of unspecified part of right bronchus or lung: Secondary | ICD-10-CM

## 2023-08-15 DIAGNOSIS — R918 Other nonspecific abnormal finding of lung field: Secondary | ICD-10-CM

## 2023-08-15 NOTE — Progress Notes (Signed)
 Met with patient during follow up visit with Dr. Wilhelmenia Harada. All questions answered during visit. Pt given resources regarding diagnosis and supportive services available. Reviewed upcoming appts. Instructed to call with any questions or needs. Pt verbalized understanding.

## 2023-08-15 NOTE — Assessment & Plan Note (Addendum)
 Images and pathology results were reviewed with the patient. cT2 N2 M0, Stage III right lung adenocarcinoma.  The diagnosis of stage III non small cell lung cancer and care plan were discussed with patient in detail.  NCCN guidelines were reviewed and shared with patient.    I recommend concurrent chemoradiation with weekly carboplatin [AUC 2] and Taxol 45mg /m2 followed by systemic immunotherapy with durvalumab. Chemotherapy education was provided.  We had discussed the composition of chemotherapy regimen, length of chemo cycle, duration of treatment and the time to assess response to treatment.  I explained to the patient the risks and benefits of chemotherapy with carboplatin and Taxol  including all but not limited to hair loss, mouth sore, nausea, vomiting, low blood counts, bleeding, infusion reactions and risk of life threatening infection and even death, secondary malignancy etc.  Risk of neuropathy is associated with Taxol. .  Patient voices understanding and willing to proceed with treatment  Send off NGS  Refer to Radonc  #  Chemotherapy education; option of port placement was discussed and patient would like to use peripheral vein access for now. Antiemetics-Zofran  and Compazine;  sent to pharmacy

## 2023-08-15 NOTE — Progress Notes (Signed)
 Hematology/Oncology Progress note [phone] [phone]        REFERRING PROVIDER: Solomon Dupre, DO    CHIEF COMPLAINTS/PURPOSE OF CONSULTATION:  Lung mass  ASSESSMENT & PLAN:   Primary lung adenocarcinoma, right [facility]) Images and pathology results were reviewed with the patient. cT2 N2 M0, Stage III right lung adenocarcinoma.  The diagnosis of stage III non small cell lung cancer and care plan were discussed with patient in detail.  NCCN guidelines were reviewed and shared with patient.    I recommend concurrent chemoradiation with weekly carboplatin [AUC 2] and Taxol 45mg /m2 followed by systemic immunotherapy with durvalumab. Chemotherapy education was provided.  We had discussed the composition of chemotherapy regimen, length of chemo cycle, duration of treatment and the time to assess response to treatment.  I explained to the patient the risks and benefits of chemotherapy with carboplatin and Taxol  including all but not limited to hair loss, mouth sore, nausea, vomiting, low blood counts, bleeding, infusion reactions and risk of life threatening infection and even death, secondary malignancy etc.  Risk of neuropathy is associated with Taxol. .  Patient voices understanding and willing to proceed with treatment  Send off NGS  Refer to Radonc  #  Chemotherapy education; option of port placement was discussed and patient would like to use peripheral vein access for now. Antiemetics-Zofran  and Compazine;  sent to pharmacy    Hemoptysis No recurrent hemoptysis.  No anemia on recent CBC. Discussed with patient about ER triggers Avoid NSAIDs    Orders Placed This Encounter  Procedures   Ambulatory referral to Radiation Oncology    Referral Priority:   Routine    Referral Type:   Consultation    Referral Reason:   Specialty Services Required    Requested Specialty:   Radiation Oncology    Number of Visits Requested:   1   Follow-up to be  determined. All questions were answered. The patient knows to call the clinic with any problems, questions or concerns.  Timmy Forbes, MD, PhD [facility] Hematology Oncology 08/15/2023    HISTORY OF PRESENTING ILLNESS:  Tanya Howell 63 y.o. female presents to establish care for lung cancer  Oncology History  Primary lung adenocarcinoma, right (HCC)  08/03/2023 Imaging   CT angiogram chest PE protocol showed  1. 3.5 x 2.6 cm right hilar mass, with obstruction of the bronchus intermedius, right middle lobe bronchus, and portions of the right lower lobe bronchus as above, highly concerning for neoplasm. Bronchoscopy is recommended for further evaluation. 2. Subcarinal adenopathy concerning for metastatic disease. 3. Dense medial segment consolidation within the right middle lobe with associated volume loss, consistent with atelectasis. 4. Prominent right lower lobe bronchiectasis, with fluid-filled bronchi and patchy right basilar consolidation consistent with combination of airspace disease and atelectasis. 5. No evidence of pulmonary embolus. 6. Aortic Atherosclerosis (ICD10-I70.0). Coronary artery atherosclerosis.   08/06/2023 Initial Diagnosis   Primary lung adenocarcinoma, right (HCC)  08/03/2023, patient presented to emergency room for evaluation of hemoptysis. Patient had pneumonia in March 2025, she continues to have a lingering cough for the past months despite being treated with doxycycline  and prednisone . She coughed up small amount of bright red blood on 08/03/2023.  Denies any shortness of breath, chest pain unintentional weight loss headache.  She felt feverish with chills 2 nights ago. Patient is a former smoker, quit smoking in 2011. Patient denies being on any antiplatelet or anticoagulation agents.  08/08/2023 She underwent biopsy via bronchoscopy.  FINAL  MICROSCOPIC DIAGNOSIS:  A. LUNG, RLL, BRUSHING:  - Adenocarcinoma   B. LUNG, RLL, FINE NEEDLE ASPIRATION:  -  Adenocarcinoma   Immunohistochemical stains were performed to characterize the tumor cells. The cells are negative for TTF-1, Napsin A, p40, synaptophysin, Chromogranin A, and CD56.  Mucicarmine stain highlights intracytoplasmic mucin. Ki67 proliferation index is approximately 30%. The overall findings are supportive of the diagnosis of adenocarcinoma.   C. LYMPH NODE, 7, FINE NEEDLE ASPIRATION:  - Adenocarcinoma  - Lymphoid tissue present   D. LYMPH NODE, 4R, FINE NEEDLE ASPIRATION:  - No malignant cells identified  - Lymphoid tissue present   E. LYMPH NODE, 11R, FINE NEEDLE ASPIRATION:  - No malignant cells identified  - Lymphoid tissue present   F. LUNG, RLL, LAVAGE:  FINAL MICROSCOPIC DIAGNOSIS:  - Atypical cells present    08/15/2023 Cancer Staging   Staging form: Lung, AJCC V9 - Clinical stage from 08/15/2023: Tanya Howell, cM0 - Signed by Timmy Forbes, MD on 08/15/2023 Stage prefix: Initial diagnosis Method of lymph node assessment: Clinical     Today she is accompanied by husband to discuss pathology results and management plan.  No additional hemoptysis event.   MEDICAL HISTORY:  Past Medical History:  Diagnosis Date   COVID-19 07/2018   Hyperlipidemia    Hypertension    Motion sickness    amuesment park rides   Osteopenia 11/01/2015   Pneumonia    april 2025   Psoriasis    Vertigo    none for over 5 yrs   Wears dentures    full upper and lower    SURGICAL HISTORY: Past Surgical History:  Procedure Laterality Date   BRONCHIAL BIOPSY  08/08/2023   Procedure: BRONCHOSCOPY, WITH BIOPSY;  Surgeon: Annitta Kindler, MD;  Location: [facility];  Service: Pulmonary;;   BRONCHIAL BRUSHINGS  08/08/2023   Procedure: BRONCHOSCOPY, WITH BRUSH BIOPSY;  Surgeon: Annitta Kindler, MD;  Location: [facility];  Service: Pulmonary;;   BRONCHIAL NEEDLE ASPIRATION BIOPSY  08/08/2023   Procedure: BRONCHOSCOPY, WITH NEEDLE ASPIRATION BIOPSY;  Surgeon: Annitta Kindler, MD;   Location: [facility];  Service: Pulmonary;;   CHOLECYSTECTOMY  1990   COLONOSCOPY WITH PROPOFOL  N/A 07/15/2020   Procedure: COLONOSCOPY WITH PROPOFOL ;  Surgeon: Marnee Sink, MD;  Location: [facility];  Service: Endoscopy;  Laterality: N/A;  priority 4   ENDOBRONCHIAL ULTRASOUND Bilateral 08/08/2023   Procedure: ENDOBRONCHIAL ULTRASOUND (EBUS);  Surgeon: Annitta Kindler, MD;  Location: [facility] ENDOSCOPY;  Service: Pulmonary;  Laterality: Bilateral;   TUBAL LIGATION      SOCIAL HISTORY: Social History   Socioeconomic History   Marital status: Married    Spouse name: Lavonia Powers   Number of children: 1   Years of education: Not on file   Highest education level: High school graduate  Occupational History   Not on file  Tobacco Use   Smoking status: Former    Current packs/day: 0.00    Average packs/day: 1 pack/day for 28.0 years (28.0 ttl pk-yrs)    Types: Cigarettes    Start date: 34    Quit date: 2011    Years since quitting: 14.3   Smokeless tobacco: Never   Tobacco comments:    Started smoking around 63 yrs old.    Smoked 1PPD at her heaviest.    Quit smoking in 2011- khj 08/07/2023  Vaping Use   Vaping status: Former  Substance and Sexual Activity   Alcohol use: No    Alcohol/week: 0.0 standard drinks of alcohol  Comment: rare   Drug use: No   Sexual activity: Yes    Birth control/protection: Surgical  Other Topics Concern   Not on file  Social History Narrative   Not on file   Social Drivers of Health   Financial Resource Strain: Low Risk  (05/06/2018)   Overall Financial Resource Strain (CARDIA)    Difficulty of Paying Living Expenses: Not hard at all  Food Insecurity: No Food Insecurity (05/06/2018)   Hunger Vital Sign    Worried About Running Out of Food in the Last Year: Never true    Ran Out of Food in the Last Year: Never true  Transportation Needs: No Transportation Needs (11/10/2019)   PRAPARE - Administrator, Civil Service (Medical):  No    Lack of Transportation (Non-Medical): No  Physical Activity: Sufficiently Active (05/06/2018)   Exercise Vital Sign    Days of Exercise per Week: 7 days    Minutes of Exercise per Session: 30 min  Stress: No Stress Concern Present (05/06/2018)   [employer] of Occupational Health - Occupational Stress Questionnaire    Feeling of Stress : Not at all  Social Connections: Somewhat Isolated (05/06/2018)   Social Connection and Isolation Panel [NHANES]    Frequency of Communication with Friends and Family: More than three times a week    Frequency of Social Gatherings with Friends and Family: Twice a week    Attends Religious Services: Never    Database administrator or Organizations: No    Attends Banker Meetings: Never    Marital Status: Married  Catering manager Violence: Not At Risk (11/10/2019)   Humiliation, Afraid, Rape, and Kick questionnaire    Fear of Current or Ex-Partner: No    Emotionally Abused: No    Physically Abused: No    Sexually Abused: No    FAMILY HISTORY: Family History  Problem Relation Age of Onset   Colon cancer Brother        54    Breast cancer Mother    Breast cancer Maternal Aunt    Pneumonia Father    Diabetes Maternal Grandmother     ALLERGIES:  is allergic to lisinopril .  MEDICATIONS:  Current Outpatient Medications  Medication Sig Dispense Refill   atorvastatin  (LIPITOR) 10 MG tablet TAKE 1 TABLET BY MOUTH AT BEDTIME 90 tablet 1   benzonatate  (TESSALON  PERLES) 100 MG capsule Take 1 capsule (100 mg total) by mouth 2 (two) times daily as needed for cough. 30 capsule 1   levalbuterol  (XOPENEX  HFA) 45 MCG/ACT inhaler Inhale 2 puffs into the lungs every 6 (six) hours as needed for wheezing. 15 g 3   losartan  (COZAAR ) 25 MG tablet Take 0.5 tablets (12.5 mg total) by mouth daily. 45 tablet 1   meclizine  (ANTIVERT ) 25 MG tablet Take 1 tablet (25 mg total) by mouth 3 (three) times daily as needed for dizziness. 30 tablet 3    triamcinolone  ointment (KENALOG ) 0.5 % Apply 1 Application topically 2 (two) times daily. 30 g 6   predniSONE  (DELTASONE ) 10 MG tablet 6 tabs in the AM day 1, 5 tabs day 2 decrease by 1 every day until gone (Patient not taking: Reported on 08/06/2023) 21 tablet 0   No current facility-administered medications for this visit.    Review of Systems  Constitutional:  Negative for appetite change, chills, fatigue and fever.  HENT:   Negative for hearing loss and voice change.   Eyes:  Negative for eye problems.  Respiratory:  Positive for cough. Negative for chest tightness and hemoptysis.   Cardiovascular:  Negative for chest pain.  Gastrointestinal:  Negative for abdominal distention, abdominal pain and blood in stool.  Endocrine: Negative for hot flashes.  Genitourinary:  Negative for difficulty urinating and frequency.   Musculoskeletal:  Negative for arthralgias.  Skin:  Negative for itching and rash.  Neurological:  Negative for extremity weakness.  Hematological:  Negative for adenopathy.  Psychiatric/Behavioral:  Negative for confusion.      PHYSICAL EXAMINATION: ECOG PERFORMANCE STATUS: 0 - Asymptomatic  Vitals:   08/15/23 0950  BP: (!) 142/72  Pulse: 85  Resp: 16  Temp: 99.1 F (37.3 C)  SpO2: 100%   Filed Weights   08/15/23 0950  Weight: 173 lb (78.5 kg)    Physical Exam Constitutional:      General: She is not in acute distress.    Appearance: She is not diaphoretic.  HENT:     Head: Normocephalic and atraumatic.  Eyes:     General: No scleral icterus. Cardiovascular:     Rate and Rhythm: Normal rate and regular rhythm.     Heart sounds: No murmur heard. Pulmonary:     Effort: Pulmonary effort is normal. No respiratory distress.     Breath sounds: No wheezing.  Abdominal:     General: There is no distension.     Palpations: Abdomen is soft.     Tenderness: There is no abdominal tenderness.  Musculoskeletal:        General: Normal range of motion.      Cervical back: Normal range of motion and neck supple.  Skin:    General: Skin is warm and dry.     Findings: No erythema.  Neurological:     Mental Status: She is alert and oriented to person, place, and time. Mental status is at baseline.     Motor: No abnormal muscle tone.  Psychiatric:        Mood and Affect: Affect normal.      LABORATORY DATA:  I have reviewed the data as listed    Latest Ref Rng & Units 08/03/2023    2:26 PM 11/13/2022    3:53 PM 10/12/2021   12:13 AM  CBC  WBC 4.0 - 10.5 K/uL 10.6  7.0  9.1   Hemoglobin 12.0 - 15.0 g/dL 29.5  18.8  41.6   Hematocrit 36.0 - 46.0 % 39.2  41.2  40.6   Platelets 150 - 400 K/uL 316  225  287       Latest Ref Rng & Units 08/03/2023    2:26 PM 06/18/2023    8:36 AM 02/18/2023    4:19 PM  CMP  Glucose 70 - 99 mg/dL 606  301  92   BUN 8 - 23 mg/dL 14  13  11    Creatinine 0.44 - 1.00 mg/dL 6.01  0.93  2.35   Sodium 135 - 145 mmol/L 137  143  142   Potassium 3.5 - 5.1 mmol/L 3.7  3.8  3.7   Chloride 98 - 111 mmol/L 104  105  105   CO2 22 - 32 mmol/L 23  22  24    Calcium  8.9 - 10.3 mg/dL 9.5  9.7  9.6   Total Protein 6.5 - 8.1 g/dL 7.5  6.9    Total Bilirubin 0.0 - 1.2 mg/dL 0.7  0.4    Alkaline Phos 38 - 126 U/L 73  94    AST 15 - 41  U/L 19  15    ALT 0 - 44 U/L 22  16       RADIOGRAPHIC STUDIES: I have personally reviewed the radiological images as listed and agreed with the findings in the report. MR Brain W Wo Contrast Result Date: 08/13/2023 CLINICAL DATA:  Lung mass, evaluation for metastasis. EXAM: MRI HEAD WITHOUT AND WITH CONTRAST TECHNIQUE: Multiplanar, multiecho pulse sequences of the brain and surrounding structures were obtained without and with intravenous contrast. CONTRAST:  7mL GADAVIST GADOBUTROL 1 MMOL/ML IV SOLN COMPARISON:  None Available. FINDINGS: Brain: No acute infarct. No evidence of intracranial hemorrhage. Scattered foci of T2/FLAIR hyperintensity in the periventricular and subcortical white matter.  No mass lesion or midline shift. Cerebellum is unremarkable. Normal appearance of midline structures. The basilar cisterns are patent. No extra-axial fluid collections. No abnormal intracranial enhancement. No intracranial lesions identified. Ventricles: Normal size and configuration of the ventricles. Vascular: Skull base flow voids are visualized. Skull and upper cervical spine: No focal abnormality. Sinuses/Orbits: Orbits are symmetric. Paranasal sinuses are clear. Other: Mastoid air cells are clear. IMPRESSION: No acute intracranial abnormality. No evidence of intracranial metastatic disease. Mild chronic microvascular ischemic changes. Electronically Signed   By: Denny Flack M.D.   On: 08/13/2023 17:30   NM PET Image Initial (PI) Skull Base To Thigh Result Date: 08/09/2023 CLINICAL DATA:  Initial treatment strategy for right hilar mass and mediastinal adenopathy on CT, suspicious for malignancy. Recent bronchoscopy. EXAM: NUCLEAR MEDICINE PET SKULL BASE TO THIGH TECHNIQUE: 9.6 mCi F-18 FDG was injected intravenously. Full-ring PET imaging was performed from the skull base to thigh after the radiotracer. CT data was obtained and used for attenuation correction and anatomic localization. Fasting blood glucose: 97 mg/dl COMPARISON:  Chest CTA 08/03/2023. FINDINGS: Mediastinal blood pool activity: SUV max 2.9 NECK: No hypermetabolic cervical lymph nodes are identified. No suspicious activity identified within the pharyngeal mucosal space. Incidental CT findings: Bilateral carotid atherosclerosis. CHEST: Previously demonstrated right hilar mass is markedly hypermetabolic. This mass measures approximately 3.0 cm on image 53/6 and has an SUV max of 32.3. There is an adjacent enlarged hypermetabolic subcarinal node measuring 1.8 cm short axis with an SUV max of 30.5. 6 mm right paratracheal node on image 44/6 has an SUV max of 5.0. No contralateral hypermetabolic mediastinal or hilar lymph nodes. There is focal  hypermetabolic activity peripherally in the anterior aspect of the right lower lobe (SUV max 7.4) corresponding with an area of probable postobstructive pneumonitis on the CT images. No well-defined nodule is identified. Compared with the prior CT, there is mildly improved aeration of the right middle lobe. No other peripheral hypermetabolic pulmonary activity. No suspicious metabolic activity or nodularity in the left lung. Incidental CT findings: Atherosclerosis of the aorta, great vessels and coronary arteries. ABDOMEN/PELVIS: There is no hypermetabolic activity within the liver, adrenal glands, spleen or pancreas. There is no hypermetabolic nodal activity in the abdomen or pelvis. Incidental CT findings: Aortoiliac atherosclerosis. Prior cholecystectomy. SKELETON: There is no hypermetabolic activity to suggest osseous metastatic disease. Incidental CT findings: none IMPRESSION: 1. The right hilar mass is markedly hypermetabolic, consistent with primary bronchogenic carcinoma. Correlate with bronchoscopy results. 2. Adjacent hypermetabolic subcarinal and right paratracheal lymph nodes are consistent with metastatic disease. 3. Focal hypermetabolic activity peripherally in the anterior aspect of the right lower lobe corresponding with probable postobstructive pneumonitis on the CT images. This could reflect a small peripheral primary malignancy. No other suspicious peripheral pulmonary activity. 4. No evidence of distant metastatic disease.  5.  Aortic Atherosclerosis (ICD10-I70.0). Electronically Signed   By: Elmon Hagedorn M.D.   On: 08/09/2023 13:04   DG Chest Port 1 View Result Date: 08/08/2023 CLINICAL DATA:  Status post bronchoscopy EXAM: PORTABLE CHEST 1 VIEW COMPARISON:  CTA chest and x-ray 08/03/2023 FINDINGS: No pneumothorax or effusion. Stable cardiopericardial silhouette with calcified aorta. Decreasing right lung base opacities. No edema. Film is under penetrated. Film is rotated to the right.  Overlapping cardiac leads. The right hilar mass is not as well seen on this x-ray. Please correlate with prior CT IMPRESSION: No pneumothorax or effusion seen post bronchoscopy. Electronically Signed   By: Adrianna Horde M.D.   On: 08/08/2023 12:09   CT Angio Chest PE W/Cm &/Or Wo Cm Result Date: 08/03/2023 CLINICAL DATA:  Intractable cough for 6-8 weeks, hemoptysis today, history of pneumonia EXAM: CT ANGIOGRAPHY CHEST WITH CONTRAST TECHNIQUE: Multidetector CT imaging of the chest was performed using the standard protocol during bolus administration of intravenous contrast. Multiplanar CT image reconstructions and MIPs were obtained to evaluate the vascular anatomy. RADIATION DOSE REDUCTION: This exam was performed according to the departmental dose-optimization program which includes automated exposure control, adjustment of the mA and/or kV according to patient size and/or use of iterative reconstruction technique. CONTRAST:  75mL OMNIPAQUE  IOHEXOL  350 MG/ML SOLN COMPARISON:  08/03/2023 FINDINGS: Cardiovascular: This is a technically adequate evaluation of the pulmonary vasculature. No filling defects or pulmonary emboli. The heart is unremarkable without pericardial effusion. No evidence of thoracic aortic aneurysm or dissection. Atherosclerosis of the aorta and coronary vasculature. Mediastinum/Nodes: Right hilar mass measuring 3.5 x 2.6 cm reference image 60/4 may reflect central obstructing neoplasm versus metastatic adenopathy. Pathologic adenopathy is seen in the subcarinal station measuring up to 1.9 cm in short axis. No contralateral hilar adenopathy. Thyroid, trachea, and esophagus appear unremarkable. Lungs/Pleura: Central obstructing soft tissue mass at the right hilum as above, with cut off of segments of the bronchus intermedius and right lower lobe bronchus, and complete cut off of the right middle lobe bronchus. There is dense consolidation and volume loss primarily within the medial segment of  the right middle lobe consistent with postobstructive atelectasis. Prominent bronchiectasis identified in the right lower lobe, with fluid-filled right lower lobe bronchi and patchy right lower lobe consolidation consistent with atelectasis or airspace disease. The left chest is clear.  No effusion or pneumothorax. Upper Abdomen: No acute abnormality. Musculoskeletal: No acute or destructive bony abnormalities. Reconstructed images demonstrate no additional findings. Review of the MIP images confirms the above findings. IMPRESSION: 1. 3.5 x 2.6 cm right hilar mass, with obstruction of the bronchus intermedius, right middle lobe bronchus, and portions of the right lower lobe bronchus as above, highly concerning for neoplasm. Bronchoscopy is recommended for further evaluation. 2. Subcarinal adenopathy concerning for metastatic disease. 3. Dense medial segment consolidation within the right middle lobe with associated volume loss, consistent with atelectasis. 4. Prominent right lower lobe bronchiectasis, with fluid-filled bronchi and patchy right basilar consolidation consistent with combination of airspace disease and atelectasis. 5. No evidence of pulmonary embolus. 6. Aortic Atherosclerosis (ICD10-I70.0). Coronary artery atherosclerosis. Electronically Signed   By: Bobbye Burrow M.D.   On: 08/03/2023 18:57   DG Chest 2 View Result Date: 08/03/2023 CLINICAL DATA:  Lingering cough after pneumonia 2 weeks ago EXAM: CHEST - 2 VIEW COMPARISON:  None Available. FINDINGS: Cholecystectomy. Midline trachea. Normal heart size. Atherosclerosis in the transverse aorta. No pleural effusion or pneumothorax. Right base airspace disease obscuring the right hemidiaphragm. Clear  left lung. IMPRESSION: Right base airspace disease which could represent residual pneumonia or atelectasis. CT is pending. Aortic Atherosclerosis (ICD10-I70.0). Electronically Signed   By: Lore Rode M.D.   On: 08/03/2023 16:28

## 2023-08-15 NOTE — Assessment & Plan Note (Signed)
 No recurrent hemoptysis.  No anemia on recent CBC. Discussed with patient about ER triggers Avoid NSAIDs

## 2023-08-15 NOTE — Progress Notes (Signed)
 Financial assistance application submitted and approved for out-of-pocket cost to not exceed $100.

## 2023-08-19 ENCOUNTER — Inpatient Hospital Stay

## 2023-08-19 NOTE — Progress Notes (Signed)
 [facility] CSW Progress Note  Clinical Social Work introduced self to patient during Patient Education with Octavio Ben, Charity fundraiser.  Provided information regarding CSW role, including counseling, advanced care planning and support group.  Answered questions as needed.  Kennth Peal, LCSW Clinical Social Worker [facility]

## 2023-08-21 ENCOUNTER — Institutional Professional Consult (permissible substitution): Admitting: Radiation Oncology

## 2023-08-26 ENCOUNTER — Ambulatory Visit
Admission: RE | Admit: 2023-08-26 | Discharge: 2023-08-26 | Disposition: A | Discharge status: Home / Self Care | Source: Ambulatory Visit | Attending: Radiation Oncology | Admitting: Radiation Oncology

## 2023-08-26 ENCOUNTER — Encounter: Payer: Self-pay | Admitting: *Deleted

## 2023-08-26 ENCOUNTER — Encounter: Payer: Self-pay | Admitting: Radiation Oncology

## 2023-08-26 ENCOUNTER — Encounter: Payer: Self-pay | Admitting: Oncology

## 2023-08-26 VITALS — BP 135/82 | HR 92 | Temp 97.6°F | Resp 16 | Wt 174.0 lb

## 2023-08-26 DIAGNOSIS — C3491 Malignant neoplasm of unspecified part of right bronchus or lung: Secondary | ICD-10-CM | POA: Diagnosis present

## 2023-08-26 NOTE — Progress Notes (Signed)
 Met with patient during initial consult with Dr. Jacalyn Martin. All questions answered during visit. Reviewed upcoming appts. Pt requested port for chemotherapy treatments. Orders placed and will notify pt with appt once scheduled. Informed pt that will schedule her chemotherapy once her radiation has been scheduled in order to coordinate treatments together. Nothing further needed at this time. Instructed pt to call with any questions or needs. Pt verbalized understanding.

## 2023-08-26 NOTE — Consult Note (Signed)
 NEW PATIENT EVALUATION  Name: Tanya Howell  MRN: [id number]  Date:   08/26/2023     DOB: 1960/07/29   This 63 y.o. 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   PREVIOUS INVESTIGATIONS:  CT scans MRI of brain and PET CT scan all reviewed Clinical notes reviewed Pathology reports reviewed  HPI: Patient is a 63 year old female who presented with pneumonia and hemoptysis.  Her initial CT scan showed a 3.5 x 2.6 right hilar mass with obstruction of the bronchus intermedius right middle lobe bronchus and portions of the right lower lobe bronchus concerning for neoplasm.  She also had subcarinal adenopathy concerning for metastatic disease.  She underwent bronchoscopy which was positive for adenocarcinoma.  Level 7 fine-needle aspiration of lymph node was also positive for adenocarcinoma.  PET/CT was performed showing right hilar mass hypermetabolic consistent with primary bronchogenic carcinoma.  She also had adjacent hypermetabolic subcarinal and right paratracheal lymph nodes involved consistent with metastatic disease.  There is also focal hypermetabolic cavity in the periphery in the anterior aspect of the right lower lobe probably postobstructive.  MRI of the brain showed no evidence of metastatic disease.  She has been seen by medical oncology.  She continues to have a cough no hemoptysis.  She has no dysphagia or marked shortness of breath is lost no weight.  She is seen today for radiation oncology opinion.  PLANNED TREATMENT REGIMEN: Concurrent chemoradiation  PAST MEDICAL HISTORY:  has a past medical history of COVID-19 (07/2018), Hyperlipidemia, Hypertension, Motion sickness, Osteopenia (11/01/2015), Pneumonia,  Psoriasis, Vertigo, and Wears dentures.    PAST SURGICAL HISTORY:  Past Surgical History:  Procedure Laterality Date   BRONCHIAL BIOPSY  08/08/2023   Procedure: BRONCHOSCOPY, WITH BIOPSY;  Surgeon: Annitta Kindler, MD;  Location: [facility];  Service: Pulmonary;;   BRONCHIAL BRUSHINGS  08/08/2023   Procedure: BRONCHOSCOPY, WITH BRUSH BIOPSY;  Surgeon: Annitta Kindler, MD;  Location: [facility];  Service: Pulmonary;;   BRONCHIAL NEEDLE ASPIRATION BIOPSY  08/08/2023   Procedure: BRONCHOSCOPY, WITH NEEDLE ASPIRATION BIOPSY;  Surgeon: Annitta Kindler, MD;  Location: [facility];  Service: Pulmonary;;   CHOLECYSTECTOMY  1990   COLONOSCOPY WITH PROPOFOL  N/A 07/15/2020   Procedure: COLONOSCOPY WITH PROPOFOL ;  Surgeon: Marnee Sink, MD;  Location[facility];  Service: Endoscopy;  Laterality: N/A;  priority 4   ENDOBRONCHIAL ULTRASOUND Bilateral 08/08/2023   Procedure: ENDOBRONCHIAL ULTRASOUND (EBUS);  Surgeon: Annitta Kindler, MD;  Location: [facility];  Service: Pulmonary;  Laterality: Bilateral;   TUBAL LIGATION      FAMILY HISTORY: family history includes Breast cancer in her maternal aunt and mother; Colon cancer in her brother; Diabetes in her maternal grandmother; Pneumonia in her father.  SOCIAL HISTORY:  reports that she quit smoking about 14 years ago. Her smoking use included cigarettes. She started smoking about 42 years ago. She has a 28 pack-year smoking history. She has never used smokeless tobacco. She reports that she does not drink alcohol and does not use drugs.  ALLERGIES: Lisinopril   MEDICATIONS:  Current Outpatient Medications  Medication Sig Dispense Refill   atorvastatin  (LIPITOR) 10 MG tablet TAKE 1 TABLET BY MOUTH AT BEDTIME 90 tablet 1   benzonatate  (TESSALON  PERLES) 100 MG capsule Take 1 capsule (100  mg total) by mouth 2 (two) times daily as needed for cough. 30 capsule 1   levalbuterol  (XOPENEX  HFA) 45 MCG/ACT inhaler Inhale 2 puffs into  the lungs every 6 (six) hours as needed for wheezing. 15 g 3   losartan  (COZAAR ) 25 MG tablet Take 0.5 tablets (12.5 mg total) by mouth daily. 45 tablet 1   meclizine  (ANTIVERT ) 25 MG tablet Take 1 tablet (25 mg total) by mouth 3 (three) times daily as needed for dizziness. 30 tablet 3   triamcinolone  ointment (KENALOG ) 0.5 % Apply 1 Application topically 2 (two) times daily. 30 g 6   No current facility-administered medications for this encounter.    ECOG PERFORMANCE STATUS:  1 - Symptomatic but completely ambulatory  REVIEW OF SYSTEMS: Patient denies any weight loss, fatigue, weakness, fever, chills or night sweats. Patient denies any loss of vision, blurred vision. Patient denies any ringing  of the ears or hearing loss. No irregular heartbeat. Patient denies heart murmur or history of fainting. Patient denies any chest pain or pain radiating to her upper extremities. Patient denies any shortness of breath, difficulty breathing at night, cough or hemoptysis. Patient denies any swelling in the lower legs. Patient denies any nausea vomiting, vomiting of blood, or coffee ground material in the vomitus. Patient denies any stomach pain. Patient states has had normal bowel movements no significant constipation or diarrhea. Patient denies any dysuria, hematuria or significant nocturia. Patient denies any problems walking, swelling in the joints or loss of balance. Patient denies any skin changes, loss of hair or loss of weight. Patient denies any excessive worrying or anxiety or significant depression. Patient denies any problems with insomnia. Patient denies excessive thirst, polyuria, polydipsia. Patient denies any swollen glands, patient denies easy bruising or easy bleeding. Patient denies any recent infections, allergies or URI. Patient "s visual fields have not changed significantly in recent time.   PHYSICAL EXAM: BP 135/82   Pulse 92   Temp 97.6 F (36.4 C)   Resp 16   Wt 174 lb (78.9 kg)    BMI 29.87 kg/m  Well-developed well-nourished patient in NAD. HEENT reveals PERLA, EOMI, discs not visualized.  Oral cavity is clear. No oral mucosal lesions are identified. Neck is clear without evidence of cervical or supraclavicular adenopathy. Lungs are clear to A&P. Cardiac examination is essentially unremarkable with regular rate and rhythm without murmur rub or thrill. Abdomen is benign with no organomegaly or masses noted. Motor sensory and DTR levels are equal and symmetric in the upper and lower extremities. Cranial nerves II through XII are grossly intact. Proprioception is intact. No peripheral adenopathy or edema is identified. No motor or sensory levels are noted. Crude visual fields are within normal range.  LABORATORY DATA: Pathology reports reviewed as well as cytology reports    RADIOLOGY RESULTS: CT scans MRI of brain and PET scan all reviewed compatible with above-stated findings   IMPRESSION: Stage III adenocarcinoma of the right lung and 63 year old female  PLAN: At this time I have recommended concurrent chemoradiation therapy.  Would plan on delivering 26 Gray over 6-1/2 weeks to her right lung tumor and mediastinal nodes.  I would use IMRT treatment planning and delivery based on the 3A nature of her disease and her ability to spare critical structures such as her esophagus heart normal lung volume and spinal cord.  Risks and benefits of treatment including possible radiation esophagitis skin reaction fatigue alteration of blood counts possible worsening of her cough all were described in  detail to the patient.  She seems to comprehend my treatment plan well.  I have set her up for simulation later this week.  She will be having a port placed we will coordinate chemotherapy with medical oncology.  There will be extra effort by both professional staff as well as technical staff to coordinate and manage concurrent chemoradiation and ensuing side effects during her treatments.   Patient comprehends my recommendations well.  I would like to take this opportunity to thank you for allowing me to participate in the care of your patient.Glenis Langdon, MD

## 2023-08-27 ENCOUNTER — Ambulatory Visit
Admission: RE | Admit: 2023-08-27 | Discharge: 2023-08-27 | Disposition: A | Discharge status: Home / Self Care | Source: Ambulatory Visit | Attending: Radiation Oncology | Admitting: Radiation Oncology

## 2023-08-27 ENCOUNTER — Encounter: Payer: Self-pay | Admitting: *Deleted

## 2023-08-27 ENCOUNTER — Encounter: Payer: Self-pay | Admitting: Oncology

## 2023-08-27 ENCOUNTER — Other Ambulatory Visit: Payer: Self-pay | Admitting: Oncology

## 2023-08-27 DIAGNOSIS — C3491 Malignant neoplasm of unspecified part of right bronchus or lung: Secondary | ICD-10-CM

## 2023-08-27 MED ORDER — ONDANSETRON HCL 8 MG PO TABS: 8.0000 mg | ORAL_TABLET | Freq: Three times a day (TID) | ORAL | 1 refills | Status: DC | PRN

## 2023-08-27 MED ORDER — PROCHLORPERAZINE MALEATE 10 MG PO TABS: 10.0000 mg | ORAL_TABLET | Freq: Four times a day (QID) | ORAL | 1 refills | Status: DC | PRN

## 2023-08-27 MED ORDER — LIDOCAINE-PRILOCAINE 2.5-2.5 % EX CREA: TOPICAL_CREAM | CUTANEOUS | 3 refills | Status: AC

## 2023-08-27 MED ORDER — DEXAMETHASONE 4 MG PO TABS: ORAL_TABLET | ORAL | 1 refills | Status: DC

## 2023-08-27 NOTE — Addendum Note (Signed)
 Addended by: Drake Gens on: 08/27/2023 11:23 AM   Modules accepted: Orders

## 2023-08-27 NOTE — Progress Notes (Signed)
 START ON PATHWAY REGIMEN - Non-Small Cell Lung     A cycle is every 7 days, concurrent with RT:     Paclitaxel      Carboplatin   **Always confirm dose/schedule in your pharmacy ordering system**  Patient Characteristics: Preoperative or Nonsurgical Candidate (Clinical Staging), Stage IIB (N2a only) or Stage III - Nonsurgical Candidate, PS = 0,1 Therapeutic Status: Preoperative or Nonsurgical Candidate (Clinical Staging) AJCC T Category: cT2a AJCC N Category: cN2a AJCC M Category: cM0 AJCC 9 Stage Grouping: IIIA Check here if patient was staged using an edition other than AJCC Staging 9th Edition: false ECOG Performance Status: 0 Intent of Therapy: Curative Intent, Discussed with Patient

## 2023-08-27 NOTE — Progress Notes (Signed)
 Met with patient to review upcoming appts. All questions answered during visit. Informed that will work on scheduling her chemotherapy treatments and she will be called with those appts. Nothing further needed at this time. Instructed to call with any questions or needs. Pt verbalized understanding.

## 2023-08-28 ENCOUNTER — Other Ambulatory Visit: Payer: Self-pay | Admitting: Radiology

## 2023-08-28 ENCOUNTER — Inpatient Hospital Stay: Admitting: Hospice and Palliative Medicine

## 2023-08-28 ENCOUNTER — Other Ambulatory Visit: Payer: Self-pay

## 2023-08-28 DIAGNOSIS — C3491 Malignant neoplasm of unspecified part of right bronchus or lung: Secondary | ICD-10-CM

## 2023-08-28 NOTE — H&P (Signed)
 Chief Complaint: Patient was seen in consultation today for primary lung adenocarcinoma, with consideration for Port-A-Cath placement.  Referring Provider(s): Dr. Timmy Forbes, MD   Supervising Physician: Myrlene Asper  Patient Status: [facility] - Out-pt  Patient is Full Code  History of Present Illness: Tanya Howell is a 63 y.o. female  with PMHx notable for HTN, HLD, osteopenia, psoriasis, and vertigo.  Per Dr. Jackqueline Howell progress note on 5/8: "Primary lung adenocarcinoma, right [facility]) Images and pathology results were reviewed with the patient. cT2 N2 M0, Stage III right lung adenocarcinoma.  The diagnosis of stage III non small cell lung cancer and care plan were discussed with patient in detail.  NCCN guidelines were reviewed and shared with patient.    I recommend concurrent chemoradiation with weekly carboplatin [AUC 2] and Taxol 45mg /m2 followed by systemic immunotherapy with durvalumab. [...]   #  Chemotherapy education; option of port placement was discussed and patient would like to use peripheral vein access for now. Antiemetics-Zofran  and Compazine;  sent to pharmacy"    Interventional Radiology was requested for Port-A-Cath placement. Patient is scheduled for same in IR today.   Patient is alert and laying in bed, calm.  Patient is currently without any significant complaints.  Patient denies any fevers, headache, chest pain, SOB, cough, abdominal pain, nausea, vomiting or bleeding.     Past Medical History:  Diagnosis Date   COVID-19 07/2018   Hyperlipidemia    Hypertension    Motion sickness    amuesment park rides   Osteopenia 11/01/2015   Pneumonia    april 2025   Psoriasis    Vertigo    none for over 5 yrs   Wears dentures    full upper and lower    Past Surgical History:  Procedure Laterality Date   BRONCHIAL BIOPSY  08/08/2023   Procedure: BRONCHOSCOPY, WITH BIOPSY;  Surgeon: Tanya Kindler, MD;  Location: [facility] ENDOSCOPY;  Service: Pulmonary;;    BRONCHIAL BRUSHINGS  08/08/2023   Procedure: BRONCHOSCOPY, WITH BRUSH BIOPSY;  Surgeon: Tanya Kindler, MD;  Location: [facility] ENDOSCOPY;  Service: Pulmonary;;   BRONCHIAL NEEDLE ASPIRATION BIOPSY  08/08/2023   Procedure: BRONCHOSCOPY, WITH NEEDLE ASPIRATION BIOPSY;  Surgeon: Tanya Kindler, MD;  Location: [facility] ENDOSCOPY;  Service: Pulmonary;;   CHOLECYSTECTOMY  1990   COLONOSCOPY WITH PROPOFOL  N/A 07/15/2020   Procedure: COLONOSCOPY WITH PROPOFOL ;  Surgeon: Tanya Sink, MD;  Location: [facility];  Service: Endoscopy;  Laterality: N/A;  priority 4   ENDOBRONCHIAL ULTRASOUND Bilateral 08/08/2023   Procedure: ENDOBRONCHIAL ULTRASOUND (EBUS);  Surgeon: Tanya Kindler, MD;  Location: [facility] ENDOSCOPY;  Service: Pulmonary;  Laterality: Bilateral;   TUBAL LIGATION      Allergies: Lisinopril   Medications: Prior to Admission medications   Medication Sig Start Date End Date Taking? Authorizing Provider  atorvastatin  (LIPITOR) 10 MG tablet TAKE 1 TABLET BY MOUTH AT BEDTIME 05/10/23   Howell, Tanya P, DO  benzonatate  (TESSALON  PERLES) 100 MG capsule Take 1 capsule (100 mg total) by mouth 2 (two) times daily as needed for cough. 08/07/23 08/06/24  Assaker, Marianne Shirts, MD  dexamethasone  (DECADRON ) 4 MG tablet Take 2 tablets daily for 2 days, start the day after chemotherapy. Take with food. 08/27/23   Timmy Forbes, MD  levalbuterol  (XOPENEX  HFA) 45 MCG/ACT inhaler Inhale 2 puffs into the lungs every 6 (six) hours as needed for wheezing. 08/07/23   Tanya Kindler, MD  lidocaine -prilocaine (EMLA) cream Apply to affected area once 08/27/23   Timmy Forbes, MD  losartan  (COZAAR ) 25 MG tablet Take 0.5 tablets (12.5 mg total) by mouth daily. 02/18/23   Howell, Tanya P, DO  meclizine  (ANTIVERT ) 25 MG tablet Take 1 tablet (25 mg total) by mouth 3 (three) times daily as needed for dizziness. 11/13/22   Howell, Tanya P, DO  ondansetron  (ZOFRAN ) 8 MG tablet Take 1 tablet (8 mg total) by mouth every 8 (eight)  hours as needed for nausea or vomiting. Start on the third day after chemotherapy. 08/27/23   Howell, Zhou, MD  prochlorperazine (COMPAZINE) 10 MG tablet Take 1 tablet (10 mg total) by mouth every 6 (six) hours as needed for nausea or vomiting. 08/27/23   Timmy Forbes, MD  triamcinolone  ointment (KENALOG ) 0.5 % Apply 1 Application topically 2 (two) times daily. 12/19/22   Solomon Dupre, DO     Family History  Problem Relation Age of Onset   Colon cancer Brother        57    Breast cancer Mother    Breast cancer Maternal Aunt    Pneumonia Father    Diabetes Maternal Grandmother     Social History   Socioeconomic History   Marital status: Married    Spouse name: Tanya Howell   Number of children: 1   Years of education: Not on file   Highest education level: High school graduate  Occupational History   Not on file  Tobacco Use   Smoking status: Former    Current packs/day: 0.00    Average packs/day: 1 pack/day for 28.0 years (28.0 ttl pk-yrs)    Types: Cigarettes    Start date: 53    Quit date: 2011    Years since quitting: 14.3   Smokeless tobacco: Never   Tobacco comments:    Started smoking around 63 yrs old.    Smoked 1PPD at her heaviest.    Quit smoking in 2011- Tanya Howell 08/07/2023  Vaping Use   Vaping status: Former  Substance and Sexual Activity   Alcohol use: No    Alcohol/week: 0.0 standard drinks of alcohol    Comment: rare   Drug use: No   Sexual activity: Yes    Birth control/protection: Surgical  Other Topics Concern   Not on file  Social History Narrative   Not on file   Social Drivers of Health   Financial Resource Strain: Low Risk  (05/06/2018)   Overall Financial Resource Strain (CARDIA)    Difficulty of Paying Living Expenses: Not hard at all  Food Insecurity: No Food Insecurity (05/06/2018)   Hunger Vital Sign    Worried About Running Out of Food in the Last Year: Never true    Ran Out of Food in the Last Year: Never true  Transportation Needs: No  Transportation Needs (11/10/2019)   PRAPARE - Administrator, Civil Service (Medical): No    Lack of Transportation (Non-Medical): No  Physical Activity: Sufficiently Active (05/06/2018)   Exercise Vital Sign    Days of Exercise per Week: 7 days    Minutes of Exercise per Session: 30 min  Stress: No Stress Concern Present (05/06/2018)   [employer] of Occupational Health - Occupational Stress Questionnaire    Feeling of Stress : Not at all  Social Connections: Somewhat Isolated (05/06/2018)   Social Connection and Isolation Panel [NHANES]    Frequency of Communication with Friends and Family: More than three times a week    Frequency of Social Gatherings with Friends and Family: Twice a week  Attends Religious Services: Never    Active Member of Clubs or Organizations: No    Attends Banker Meetings: Never    Marital Status: Married     Review of Systems: A 12 point ROS discussed and pertinent positives are indicated in the HPI above.  All other systems are negative.  Vital Signs: BP 126/69   Pulse 82   Temp 98.1 F (36.7 C) (Oral)   Resp 18   Ht 5\' 4"  (1.626 m)   Wt 169 lb (76.7 kg)   SpO2 96%   BMI 29.01 kg/m   Advance Care Plan: The advanced care place/surrogate decision maker was discussed at the time of visit and the patient did not wish to discuss or was not able to name a surrogate decision maker or provide an advance care plan.  Physical Exam Constitutional:      General: She is not in acute distress.    Appearance: Normal appearance.  HENT:     Mouth/Throat:     Mouth: Mucous membranes are dry.  Cardiovascular:     Rate and Rhythm: Normal rate and regular rhythm.     Heart sounds: No murmur heard. Pulmonary:     Effort: Pulmonary effort is normal.     Breath sounds: Normal breath sounds. No wheezing.  Musculoskeletal:        General: Normal range of motion.     Cervical back: Normal range of motion.  Skin:    General: Skin is  warm and dry.  Neurological:     Mental Status: She is alert and oriented to person, place, and time.  Psychiatric:        Mood and Affect: Mood normal.        Behavior: Behavior normal.        Thought Content: Thought content normal.        Judgment: Judgment normal.     Imaging: MR Brain W Wo Contrast Result Date: 08/13/2023 CLINICAL DATA:  Lung mass, evaluation for metastasis. EXAM: MRI HEAD WITHOUT AND WITH CONTRAST TECHNIQUE: Multiplanar, multiecho pulse sequences of the brain and surrounding structures were obtained without and with intravenous contrast. CONTRAST:  7mL GADAVIST  GADOBUTROL  1 MMOL/ML IV SOLN COMPARISON:  None Available. FINDINGS: Brain: No acute infarct. No evidence of intracranial hemorrhage. Scattered foci of T2/FLAIR hyperintensity in the periventricular and subcortical white matter. No mass lesion or midline shift. Cerebellum is unremarkable. Normal appearance of midline structures. The basilar cisterns are patent. No extra-axial fluid collections. No abnormal intracranial enhancement. No intracranial lesions identified. Ventricles: Normal size and configuration of the ventricles. Vascular: Skull base flow voids are visualized. Skull and upper cervical spine: No focal abnormality. Sinuses/Orbits: Orbits are symmetric. Paranasal sinuses are clear. Other: Mastoid air cells are clear. IMPRESSION: No acute intracranial abnormality. No evidence of intracranial metastatic disease. Mild chronic microvascular ischemic changes. Electronically Signed   By: Denny Flack M.D.   On: 08/13/2023 17:30   NM PET Image Initial (PI) Skull Base To Thigh Result Date: 08/09/2023 CLINICAL DATA:  Initial treatment strategy for right hilar mass and mediastinal adenopathy on CT, suspicious for malignancy. Recent bronchoscopy. EXAM: NUCLEAR MEDICINE PET SKULL BASE TO THIGH TECHNIQUE: 9.6 mCi F-18 FDG was injected intravenously. Full-ring PET imaging was performed from the skull base to thigh after the  radiotracer. CT data was obtained and used for attenuation correction and anatomic localization. Fasting blood glucose: 97 mg/dl COMPARISON:  Chest CTA 08/03/2023. FINDINGS: Mediastinal blood pool activity: SUV max 2.9 NECK: No  hypermetabolic cervical lymph nodes are identified. No suspicious activity identified within the pharyngeal mucosal space. Incidental CT findings: Bilateral carotid atherosclerosis. CHEST: Previously demonstrated right hilar mass is markedly hypermetabolic. This mass measures approximately 3.0 cm on image 53/6 and has an SUV max of 32.3. There is an adjacent enlarged hypermetabolic subcarinal node measuring 1.8 cm short axis with an SUV max of 30.5. 6 mm right paratracheal node on image 44/6 has an SUV max of 5.0. No contralateral hypermetabolic mediastinal or hilar lymph nodes. There is focal hypermetabolic activity peripherally in the anterior aspect of the right lower lobe (SUV max 7.4) corresponding with an area of probable postobstructive pneumonitis on the CT images. No well-defined nodule is identified. Compared with the prior CT, there is mildly improved aeration of the right middle lobe. No other peripheral hypermetabolic pulmonary activity. No suspicious metabolic activity or nodularity in the left lung. Incidental CT findings: Atherosclerosis of the aorta, great vessels and coronary arteries. ABDOMEN/PELVIS: There is no hypermetabolic activity within the liver, adrenal glands, spleen or pancreas. There is no hypermetabolic nodal activity in the abdomen or pelvis. Incidental CT findings: Aortoiliac atherosclerosis. Prior cholecystectomy. SKELETON: There is no hypermetabolic activity to suggest osseous metastatic disease. Incidental CT findings: none IMPRESSION: 1. The right hilar mass is markedly hypermetabolic, consistent with primary bronchogenic carcinoma. Correlate with bronchoscopy results. 2. Adjacent hypermetabolic subcarinal and right paratracheal lymph nodes are consistent  with metastatic disease. 3. Focal hypermetabolic activity peripherally in the anterior aspect of the right lower lobe corresponding with probable postobstructive pneumonitis on the CT images. This could reflect a small peripheral primary malignancy. No other suspicious peripheral pulmonary activity. 4. No evidence of distant metastatic disease. 5.  Aortic Atherosclerosis (ICD10-I70.0). Electronically Signed   By: Elmon Hagedorn M.D.   On: 08/09/2023 13:04   DG Chest Port 1 View Result Date: 08/08/2023 CLINICAL DATA:  Status post bronchoscopy EXAM: PORTABLE CHEST 1 VIEW COMPARISON:  CTA chest and x-ray 08/03/2023 FINDINGS: No pneumothorax or effusion. Stable cardiopericardial silhouette with calcified aorta. Decreasing right lung base opacities. No edema. Film is under penetrated. Film is rotated to the right. Overlapping cardiac leads. The right hilar mass is not as well seen on this x-ray. Please correlate with prior CT IMPRESSION: No pneumothorax or effusion seen post bronchoscopy. Electronically Signed   By: Adrianna Horde M.D.   On: 08/08/2023 12:09   CT Angio Chest PE W/Cm &/Or Wo Cm Result Date: 08/03/2023 CLINICAL DATA:  Intractable cough for 6-8 weeks, hemoptysis today, history of pneumonia EXAM: CT ANGIOGRAPHY CHEST WITH CONTRAST TECHNIQUE: Multidetector CT imaging of the chest was performed using the standard protocol during bolus administration of intravenous contrast. Multiplanar CT image reconstructions and MIPs were obtained to evaluate the vascular anatomy. RADIATION DOSE REDUCTION: This exam was performed according to the departmental dose-optimization program which includes automated exposure control, adjustment of the mA and/or kV according to patient size and/or use of iterative reconstruction technique. CONTRAST:  75mL OMNIPAQUE  IOHEXOL  350 MG/ML SOLN COMPARISON:  08/03/2023 FINDINGS: Cardiovascular: This is a technically adequate evaluation of the pulmonary vasculature. No filling defects or  pulmonary emboli. The heart is unremarkable without pericardial effusion. No evidence of thoracic aortic aneurysm or dissection. Atherosclerosis of the aorta and coronary vasculature. Mediastinum/Nodes: Right hilar mass measuring 3.5 x 2.6 cm reference image 60/4 may reflect central obstructing neoplasm versus metastatic adenopathy. Pathologic adenopathy is seen in the subcarinal station measuring up to 1.9 cm in short axis. No contralateral hilar adenopathy. Thyroid, trachea, and esophagus  appear unremarkable. Lungs/Pleura: Central obstructing soft tissue mass at the right hilum as above, with cut off of segments of the bronchus intermedius and right lower lobe bronchus, and complete cut off of the right middle lobe bronchus. There is dense consolidation and volume loss primarily within the medial segment of the right middle lobe consistent with postobstructive atelectasis. Prominent bronchiectasis identified in the right lower lobe, with fluid-filled right lower lobe bronchi and patchy right lower lobe consolidation consistent with atelectasis or airspace disease. The left chest is clear.  No effusion or pneumothorax. Upper Abdomen: No acute abnormality. Musculoskeletal: No acute or destructive bony abnormalities. Reconstructed images demonstrate no additional findings. Review of the MIP images confirms the above findings. IMPRESSION: 1. 3.5 x 2.6 cm right hilar mass, with obstruction of the bronchus intermedius, right middle lobe bronchus, and portions of the right lower lobe bronchus as above, highly concerning for neoplasm. Bronchoscopy is recommended for further evaluation. 2. Subcarinal adenopathy concerning for metastatic disease. 3. Dense medial segment consolidation within the right middle lobe with associated volume loss, consistent with atelectasis. 4. Prominent right lower lobe bronchiectasis, with fluid-filled bronchi and patchy right basilar consolidation consistent with combination of airspace  disease and atelectasis. 5. No evidence of pulmonary embolus. 6. Aortic Atherosclerosis (ICD10-I70.0). Coronary artery atherosclerosis. Electronically Signed   By: Bobbye Burrow M.D.   On: 08/03/2023 18:57   DG Chest 2 View Result Date: 08/03/2023 CLINICAL DATA:  Lingering cough after pneumonia 2 weeks ago EXAM: CHEST - 2 VIEW COMPARISON:  None Available. FINDINGS: Cholecystectomy. Midline trachea. Normal heart size. Atherosclerosis in the transverse aorta. No pleural effusion or pneumothorax. Right base airspace disease obscuring the right hemidiaphragm. Clear left lung. IMPRESSION: Right base airspace disease which could represent residual pneumonia or atelectasis. CT is pending. Aortic Atherosclerosis (ICD10-I70.0). Electronically Signed   By: Lore Rode M.D.   On: 08/03/2023 16:28    Labs:  CBC: Recent Labs    11/13/22 1553 08/03/23 1426  WBC 7.0 10.6*  HGB 13.6 13.1  HCT 41.2 39.2  PLT 225 316    COAGS: No results for input(s): "INR", "APTT" in the last 8760 hours.  BMP: Recent Labs    12/19/22 1030 02/18/23 1619 06/18/23 0836 08/03/23 1426  NA 141 142 143 137  K 4.3 3.7 3.8 3.7  CL 103 105 105 104  CO2 23 24 22 23   GLUCOSE 89 92 100* 101*  BUN 14 11 13 14   CALCIUM  9.9 9.6 9.7 9.5  CREATININE 0.87 0.93 0.86 0.80  GFRNONAA  --   --   --  >60    LIVER FUNCTION TESTS: Recent Labs    11/13/22 1553 06/18/23 0836 08/03/23 1426  BILITOT 0.3 0.4 0.7  AST 28 15 19   ALT 33* 16 22  ALKPHOS 98 94 73  PROT 7.3 6.9 7.5  ALBUMIN 4.6 4.5 4.5    TUMOR MARKERS: No results for input(s): "AFPTM", "CEA", "CA199", "CHROMGRNA" in the last 8760 hours.  Assessment and Plan: Per Dr. Jackqueline Howell progress note on 5/8: "Primary lung adenocarcinoma, right [facility]) Images and pathology results were reviewed with the patient. cT2 N2 M0, Stage III right lung adenocarcinoma.  The diagnosis of stage III non small cell lung cancer and care plan were discussed with patient in detail.  NCCN  guidelines were reviewed and shared with patient.    I recommend concurrent chemoradiation with weekly carboplatin [AUC 2] and Taxol 45mg /m2 followed by systemic immunotherapy with durvalumab. [...]   #  Chemotherapy education;  option of port placement was discussed and patient would like to use peripheral vein access for now. Antiemetics-Zofran  and Compazine;  sent to pharmacy"  Patient presents for scheduled Port-A-Cath placement in IR today.  Patient has been NPO since midnight.  All labs and medications are within acceptable parameters.  No pertinent allergies.   Risks and benefits of image guided port-a-catheter placement was discussed with the patient including, but not limited to bleeding, infection, pneumothorax, or fibrin sheath development and need for additional procedures.  All of the patient's questions were answered, patient is agreeable to proceed. Consent signed and in chart.      Thank you for allowing our service to participate in KIMBERY HARWOOD 's care.  Electronically Signed: Lovena Rubinstein, PA-C   08/29/2023, 2:15 PM      I spent a total of 30 Minutesin face to face in clinical consultation, greater than 50% of which was counseling/coordinating care for primary lung adenocarcinoma, with consideration for Port-A-Cath placement.

## 2023-08-28 NOTE — Progress Notes (Signed)
 Multidisciplinary Oncology Council Documentation  DESERAE Howell was presented by our [facility] on 08/28/2023, which included representatives from:  Palliative Care Dietitian  Physical/Occupational Therapist Nurse Navigator Genetics Social work Survivorship RN Financial Navigator Research RN   Genecis currently presents with history of lung cancer  We reviewed previous medical and familial history, history of present illness, and recent lab results along with all available histopathologic and imaging studies. The MOC considered available treatment options and made the following recommendations/referrals:  Nutrition, SW  The MOC is a meeting of clinicians from various specialty areas who evaluate and discuss patients for whom a multidisciplinary approach is being considered. Final determinations in the plan of care are those of the provider(s).   Today's extended care, comprehensive team conference, [name] was not present for the discussion and was not examined.

## 2023-08-28 NOTE — Progress Notes (Signed)
 Pharmacist Chemotherapy Monitoring - Initial Assessment    Anticipated start date: 09/09/23   The following has been reviewed per standard work regarding the patient's treatment regimen: The patient's diagnosis, treatment plan and drug doses, and organ/hematologic function Lab orders and baseline tests specific to treatment regimen  The treatment plan start date, drug sequencing, and pre-medications Prior authorization status  Patient's documented medication list, including drug-drug interaction screen and prescriptions for anti-emetics and supportive care specific to the treatment regimen The drug concentrations, fluid compatibility, administration routes, and timing of the medications to be used The patient's access for treatment and lifetime cumulative dose history, if applicable  The patient's medication allergies and previous infusion related reactions, if applicable   Changes made to treatment plan:  N/A  Follow up needed:  N/A   Glendora Landsman, PharmD, BCPS Clinical Pharmacist   08/28/2023  1:58 PM

## 2023-08-28 NOTE — Progress Notes (Signed)
 Patient for IR Port Insert on Thurs 08/29/23, I called and spoke with the patient on the phone and gave pre-procedure instructions. Pt was made aware to be here at 12:30p, NPO after MN prior to procedure as well as driver post procedure/recovery/discharge. Pt stated understanding.  Called   08/28/23

## 2023-08-29 ENCOUNTER — Other Ambulatory Visit: Payer: Self-pay

## 2023-08-29 ENCOUNTER — Ambulatory Visit
Admission: RE | Admit: 2023-08-29 | Discharge: 2023-08-29 | Disposition: A | Discharge status: Home / Self Care | Source: Ambulatory Visit | Attending: Oncology | Admitting: Oncology

## 2023-08-29 DIAGNOSIS — Z87891 Personal history of nicotine dependence: Secondary | ICD-10-CM | POA: Insufficient documentation

## 2023-08-29 DIAGNOSIS — C3491 Malignant neoplasm of unspecified part of right bronchus or lung: Secondary | ICD-10-CM | POA: Diagnosis present

## 2023-08-29 HISTORY — PX: IR IMAGING GUIDED PORT INSERTION: IMG5740

## 2023-08-29 MED ORDER — FENTANYL CITRATE (PF) 100 MCG/2ML IJ SOLN
INTRAMUSCULAR | Status: AC
  Filled 2023-08-29: qty 2

## 2023-08-29 MED ORDER — HEPARIN SOD (PORK) LOCK FLUSH 100 UNIT/ML IV SOLN
500.0000 [IU] | Freq: Once | INTRAVENOUS | Status: AC
  Administered 2023-08-29: 500 [IU] via INTRAVENOUS

## 2023-08-29 MED ORDER — FENTANYL CITRATE (PF) 100 MCG/2ML IJ SOLN
INTRAMUSCULAR | Status: AC | PRN
Start: 2023-08-29 — End: 2023-08-29
  Administered 2023-08-29: 50 ug via INTRAVENOUS
  Administered 2023-08-29 (×3): 25 ug via INTRAVENOUS

## 2023-08-29 MED ORDER — MIDAZOLAM HCL 2 MG/2ML IJ SOLN
INTRAMUSCULAR | Status: AC
  Filled 2023-08-29: qty 2

## 2023-08-29 MED ORDER — SODIUM CHLORIDE 0.9 % IV SOLN: INTRAVENOUS | Status: DC

## 2023-08-29 MED ORDER — MIDAZOLAM HCL 2 MG/2ML IJ SOLN
INTRAMUSCULAR | Status: AC | PRN
  Administered 2023-08-29 (×3): .5 mg via INTRAVENOUS
  Administered 2023-08-29: 1 mg via INTRAVENOUS

## 2023-08-29 MED ORDER — LIDOCAINE HCL 1 % IJ SOLN
INTRAMUSCULAR | Status: AC
  Filled 2023-08-29: qty 20

## 2023-08-29 MED ORDER — HEPARIN SOD (PORK) LOCK FLUSH 100 UNIT/ML IV SOLN
INTRAVENOUS | Status: AC
  Filled 2023-08-29: qty 5

## 2023-08-29 MED ORDER — LIDOCAINE HCL 1 % IJ SOLN
18.0000 mL | Freq: Once | INTRAMUSCULAR | Status: AC
  Administered 2023-08-29: 18 mL via INTRADERMAL

## 2023-08-29 NOTE — Procedures (Signed)
 Interventional Radiology Procedure Note  Procedure: Placement of a right IJ approach single lumen PowerPort.  Tip is positioned at the superior cavoatrial junction and catheter is ready for immediate use.  Complications: None Recommendations:  - Ok to shower tomorrow - Do not submerge for 7 days - Routine line care   Signed,  Yvone Neu. Loreta Ave, DO

## 2023-09-04 ENCOUNTER — Encounter: Payer: Self-pay | Admitting: Oncology

## 2023-09-04 DIAGNOSIS — C3491 Malignant neoplasm of unspecified part of right bronchus or lung: Secondary | ICD-10-CM | POA: Diagnosis not present

## 2023-09-05 ENCOUNTER — Ambulatory Visit
Admission: RE | Admit: 2023-09-05 | Discharge: 2023-09-05 | Discharge status: Home / Self Care | Source: Ambulatory Visit | Attending: Radiation Oncology | Admitting: Radiation Oncology

## 2023-09-06 ENCOUNTER — Other Ambulatory Visit

## 2023-09-06 ENCOUNTER — Encounter: Payer: Self-pay | Admitting: Oncology

## 2023-09-06 ENCOUNTER — Inpatient Hospital Stay: Admitting: Oncology

## 2023-09-06 ENCOUNTER — Encounter: Payer: Self-pay | Admitting: *Deleted

## 2023-09-06 ENCOUNTER — Inpatient Hospital Stay

## 2023-09-06 ENCOUNTER — Telehealth: Payer: Self-pay

## 2023-09-06 VITALS — BP 136/82 | HR 79 | Temp 98.0°F | Resp 18 | Wt 170.5 lb

## 2023-09-06 DIAGNOSIS — C3491 Malignant neoplasm of unspecified part of right bronchus or lung: Secondary | ICD-10-CM

## 2023-09-06 DIAGNOSIS — R053 Chronic cough: Secondary | ICD-10-CM

## 2023-09-06 DIAGNOSIS — Z5111 Encounter for antineoplastic chemotherapy: Secondary | ICD-10-CM

## 2023-09-06 LAB — CBC WITH DIFFERENTIAL (CANCER CENTER ONLY)
Abs Immature Granulocytes: 0.03 10*3/uL (ref 0.00–0.07)
Basophils Absolute: 0.1 10*3/uL (ref 0.0–0.1)
Basophils Relative: 1 %
Eosinophils Absolute: 0.3 10*3/uL (ref 0.0–0.5)
Eosinophils Relative: 3 %
HCT: 40.4 % (ref 36.0–46.0)
Hemoglobin: 13.2 g/dL (ref 12.0–15.0)
Immature Granulocytes: 0 %
Lymphocytes Relative: 24 %
Lymphs Abs: 2 10*3/uL (ref 0.7–4.0)
MCH: 27.6 pg (ref 26.0–34.0)
MCHC: 32.7 g/dL (ref 30.0–36.0)
MCV: 84.5 fL (ref 80.0–100.0)
Monocytes Absolute: 0.7 10*3/uL (ref 0.1–1.0)
Monocytes Relative: 8 %
Neutro Abs: 5.4 10*3/uL (ref 1.7–7.7)
Neutrophils Relative %: 64 %
Platelet Count: 295 10*3/uL (ref 150–400)
RBC: 4.78 MIL/uL (ref 3.87–5.11)
RDW: 12.9 % (ref 11.5–15.5)
WBC Count: 8.4 10*3/uL (ref 4.0–10.5)
nRBC: 0 % (ref 0.0–0.2)

## 2023-09-06 LAB — CMP (CANCER CENTER ONLY)
ALT: 20 U/L (ref 0–44)
AST: 16 U/L (ref 15–41)
Albumin: 4.3 g/dL (ref 3.5–5.0)
Alkaline Phosphatase: 82 U/L (ref 38–126)
Anion gap: 9 (ref 5–15)
BUN: 14 mg/dL (ref 8–23)
CO2: 24 mmol/L (ref 22–32)
Calcium: 9.3 mg/dL (ref 8.9–10.3)
Chloride: 105 mmol/L (ref 98–111)
Creatinine: 0.81 mg/dL (ref 0.44–1.00)
GFR, Estimated: >60 mL/min (ref >60)
Glucose, Bld: 104 mg/dL — ABNORMAL HIGH (ref 70–99)
Potassium: 4.1 mmol/L (ref 3.5–5.1)
Sodium: 138 mmol/L (ref 135–145)
Total Bilirubin: 0.7 mg/dL (ref 0.0–1.2)
Total Protein: 7.4 g/dL (ref 6.5–8.1)

## 2023-09-06 MED ORDER — GUAIFENESIN-CODEINE 100-10 MG/5ML PO SOLN: 5.0000 mL | Freq: Four times a day (QID) | ORAL | 0 refills | Status: DC | PRN

## 2023-09-06 NOTE — Assessment & Plan Note (Signed)
 Chemotherapy treatment as planned above

## 2023-09-06 NOTE — Assessment & Plan Note (Signed)
 She has tried over the counter cough medication with no relief.  Recommend Guaifenesin  Codeine  cough suppressant PRN

## 2023-09-06 NOTE — Telephone Encounter (Addendum)
 PA for Guifenesin-Codeine  100-10 mg/6mL completed and sutmitted electronically to [facility].   Key [id number] PA CAse ID: [id number]  (PHARMACY HELP DESK [phone])

## 2023-09-06 NOTE — Progress Notes (Signed)
 Hematology/Oncology Progress note [phone] [phone]       CHIEF COMPLAINTS/PURPOSE OF CONSULTATION:  Stage III lung adenocarcinoam  ASSESSMENT & PLAN:   Primary lung adenocarcinoma, right [facility]) Images and pathology results were reviewed with the patient. cT2 N2 M0, Stage III right lung adenocarcinoma.  NGS showed TPS 5% CPS 10  KRAS G12C, TMB 12.6  Labs are reviewed and discussed with patient. Proceed with carboplatin AUC 2, Taxol 45mg /m2      Cough She has tried over the counter cough medication with no relief.  Recommend Guaifenesin Codeine cough suppressant PRN  Encounter for antineoplastic chemotherapy Chemotherapy treatment as planned above.     No orders of the defined types were placed in this encounter.  Follow-up 1 week All questions were answered. The patient knows to call the clinic with any problems, questions or concerns.  Timmy Forbes, MD, PhD [facility] Hematology Oncology 09/06/2023    HISTORY OF PRESENTING ILLNESS:  Tanya Howell 64 y.o. female presents to establish care for lung cancer  Oncology History  Primary lung adenocarcinoma, right (HCC)  08/03/2023 Imaging   CT angiogram chest PE protocol showed  1. 3.5 x 2.6 cm right hilar mass, with obstruction of the bronchus intermedius, right middle lobe bronchus, and portions of the right lower lobe bronchus as above, highly concerning for neoplasm. Bronchoscopy is recommended for further evaluation. 2. Subcarinal adenopathy concerning for metastatic disease. 3. Dense medial segment consolidation within the right middle lobe with associated volume loss, consistent with atelectasis. 4. Prominent right lower lobe bronchiectasis, with fluid-filled bronchi and patchy right basilar consolidation consistent with combination of airspace disease and atelectasis. 5. No evidence of pulmonary embolus. 6. Aortic Atherosclerosis (ICD10-I70.0). Coronary artery atherosclerosis.    08/06/2023 Initial Diagnosis   Primary lung adenocarcinoma, right (HCC)  08/03/2023, patient presented to emergency room for evaluation of hemoptysis. Patient had pneumonia in March 2025, she continues to have a lingering cough for the past months despite being treated with doxycycline  and prednisone . She coughed up small amount of bright red blood on 08/03/2023.  Denies any shortness of breath, chest pain unintentional weight loss headache.  She felt feverish with chills 2 nights ago. Patient is a former smoker, quit smoking in 2011. Patient denies being on any antiplatelet or anticoagulation agents.  08/08/2023 She underwent biopsy via bronchoscopy.  FINAL MICROSCOPIC DIAGNOSIS:  A. LUNG, RLL, BRUSHING:  - Adenocarcinoma   B. LUNG, RLL, FINE NEEDLE ASPIRATION:  - Adenocarcinoma   Immunohistochemical stains were performed to characterize the tumor cells. The cells are negative for TTF-1, Napsin A, p40, synaptophysin, Chromogranin A, and CD56.  Mucicarmine stain highlights intracytoplasmic mucin. Ki67 proliferation index is approximately 30%. The overall findings are supportive of the diagnosis of adenocarcinoma.   C. LYMPH NODE, 7, FINE NEEDLE ASPIRATION:  - Adenocarcinoma  - Lymphoid tissue present   D. LYMPH NODE, 4R, FINE NEEDLE ASPIRATION:  - No malignant cells identified  - Lymphoid tissue present   E. LYMPH NODE, 11R, FINE NEEDLE ASPIRATION:  - No malignant cells identified  - Lymphoid tissue present   F. LUNG, RLL, LAVAGE:  FINAL MICROSCOPIC DIAGNOSIS:  - Atypical cells present    08/15/2023 Cancer Staging   Staging form: Lung, AJCC V9 - Clinical stage from 08/15/2023: [name], cM0 - Signed by Timmy Forbes, MD on 08/15/2023 Stage prefix: Initial diagnosis Method of lymph node assessment: Clinical   09/09/2023 -  Chemotherapy   Patient is on Treatment Plan : LUNG Carboplatin + Paclitaxel +  XRT q7d       During interval, patient has had Mediport placed.  She has no new  complaints except intermittent cough.  She has tried over-the-counter cough medication which did not help.  She would like to get the prescribed cough medication for some relief.  MEDICAL HISTORY:  Past Medical History:  Diagnosis Date   COVID-19 07/2018   Hyperlipidemia    Hypertension    Motion sickness    amuesment park rides   Osteopenia 11/01/2015   Pneumonia    april 2025   Psoriasis    Vertigo    none for over 5 yrs   Wears dentures    full upper and lower    SURGICAL HISTORY: Past Surgical History:  Procedure Laterality Date   BRONCHIAL BIOPSY  08/08/2023   Procedure: BRONCHOSCOPY, WITH BIOPSY;  Surgeon: Annitta Kindler, MD;  Location: [facility] END[facility]COPY;  Service: Pulmonary;;   BRONCHIAL BRUSHINGS  08/08/2023   Procedure: BRONCHOSCOPY, WITH BRUSH BIOPSY;  Surgeon: Annitta Kindler, MD;  Location: [facility];  Service: Pulmonary;;   BRONCHIAL NEEDLE ASPIRATION BIOPSY  08/08/2023   Procedure: BRONCHOSCOPY, WITH NEEDLE ASPIRATION BIOPSY;  Surgeon: Annitta Kindler, MD;  Location: [facility] ENDOSCOPY;  Service: Pulmonary;;   CHOLECYSTECTOMY  1990   COLONOSCOPY WITH PROPOFOL  N/A 07/15/2020   Procedure: COLONOSCOPY WITH PROPOFOL ;  Surgeon: Marnee Sink, MD;  Location: [facility];  Service: Endoscopy;  Laterality: N/A;  priority 4   ENDOBRONCHIAL ULTRASOUND Bilateral 08/08/2023   Procedure: ENDOBRONCHIAL ULTRASOUND (EBUS);  Surgeon: Annitta Kindler, MD;  Location: [facility];  Service: Pulmonary;  Laterality: Bilateral;   IR IMAGING GUIDED PORT INSERTION  08/29/2023   TUBAL LIGATION      SOCIAL HISTORY: Social History   Socioeconomic History   Marital status: Married    Spouse name: Lavonia Powers   Number of children: 1   Years of education: Not on file   Highest education level: High school graduate  Occupational History   Not on file  Tobacco Use   Smoking status: Former    Current packs/day: 0.00    Average packs/day: 1 pack/day for 28.0 years (28.0 ttl pk-yrs)     Types: Cigarettes    Start date: 39    Quit date: 2011    Years since quitting: 14.4   Smokeless tobacco: Never   Tobacco comments:    Started smoking around 63 yrs old.    Smoked 1PPD at her heaviest.    Quit smoking in 2011- [name] 08/07/2023  Vaping Use   Vaping status: Former  Substance and Sexual Activity   Alcohol use: No    Alcohol/week: 0.0 standard drinks of alcohol    Comment: rare   Drug use: No   Sexual activity: Yes    Birth control/protection: Surgical  Other Topics Concern   Not on file  Social History Narrative   Not on file   Social Drivers of Health   Financial Resource Strain: Low Risk  (05/06/2018)   Overall Financial Resource Strain (CARDIA)    Difficulty of Paying Living Expenses: Not hard at all  Food Insecurity: No Food Insecurity (05/06/2018)   Hunger Vital Sign    Worried About Running Out of Food in the Last Year: Never true    Ran Out of Food in the Last Year: Never true  Transportation Needs: No Transportation Needs (11/10/2019)   PRAPARE - Administrator, Civil Service (Medical): No    Lack of Transportation (Non-Medical): No  Physical Activity: Sufficiently Active (05/06/2018)  Exercise Vital Sign    Days of Exercise per Week: 7 days    Minutes of Exercise per Session: 30 min  Stress: No Stress Concern Present (05/06/2018)   [employer] of Occupational Health - Occupational Stress Questionnaire    Feeling of Stress : Not at all  Social Connections: Somewhat Isolated (05/06/2018)   Social Connection and Isolation Panel [NHANES]    Frequency of Communication with Friends and Family: More than three times a week    Frequency of Social Gatherings with Friends and Family: Twice a week    Attends Religious Services: Never    Database administrator or Organizations: No    Attends Banker Meetings: Never    Marital Status: Married  Catering manager Violence: Not At Risk (11/10/2019)   Humiliation, Afraid, Rape, and  Kick questionnaire    Fear of Current or Ex-Partner: No    Emotionally Abused: No    Physically Abused: No    Sexually Abused: No    FAMILY HISTORY: Family History  Problem Relation Age of Onset   Colon cancer Brother        82    Breast cancer Mother    Breast cancer Maternal Aunt    Pneumonia Father    Diabetes Maternal Grandmother     ALLERGIES:  is allergic to lisinopril .  MEDICATIONS:  Current Outpatient Medications  Medication Sig Dispense Refill   atorvastatin  (LIPITOR) 10 MG tablet TAKE 1 TABLET BY MOUTH AT BEDTIME 90 tablet 1   benzonatate  (TESSALON  PERLES) 100 MG capsule Take 1 capsule (100 mg total) by mouth 2 (two) times daily as needed for cough. 30 capsule 1   dexamethasone  (DECADRON ) 4 MG tablet Take 2 tablets daily for 2 days, start the day after chemotherapy. Take with food. 30 tablet 1   guaifenesin (ROBITUSSIN) 100 MG/5ML syrup Take 200 mg by mouth 3 (three) times daily as needed for cough.     guaiFENesin-codeine 100-10 MG/5ML syrup Take 5 mLs by mouth every 6 (six) hours as needed for cough. 237 mL 0   levalbuterol  (XOPENEX  HFA) 45 MCG/ACT inhaler Inhale 2 puffs into the lungs every 6 (six) hours as needed for wheezing. 15 g 3   lidocaine -prilocaine (EMLA) cream Apply to affected area once 30 g 3   losartan  (COZAAR ) 25 MG tablet Take 0.5 tablets (12.5 mg total) by mouth daily. 45 tablet 1   meclizine  (ANTIVERT ) 25 MG tablet Take 1 tablet (25 mg total) by mouth 3 (three) times daily as needed for dizziness. 30 tablet 3   ondansetron  (ZOFRAN ) 8 MG tablet Take 1 tablet (8 mg total) by mouth every 8 (eight) hours as needed for nausea or vomiting. Start on the third day after chemotherapy. 30 tablet 1   prochlorperazine (COMPAZINE) 10 MG tablet Take 1 tablet (10 mg total) by mouth every 6 (six) hours as needed for nausea or vomiting. 30 tablet 1   triamcinolone  ointment (KENALOG ) 0.5 % Apply 1 Application topically 2 (two) times daily. 30 g 6   No current  facility-administered medications for this visit.    Review of Systems  Constitutional:  Negative for appetite change, chills, fatigue and fever.  HENT:   Negative for hearing loss and voice change.   Eyes:  Negative for eye problems.  Respiratory:  Positive for cough. Negative for chest tightness and hemoptysis.   Cardiovascular:  Negative for chest pain.  Gastrointestinal:  Negative for abdominal distention, abdominal pain and blood in stool.  Endocrine:  Negative for hot flashes.  Genitourinary:  Negative for difficulty urinating and frequency.   Musculoskeletal:  Negative for arthralgias.  Skin:  Negative for itching and rash.  Neurological:  Negative for extremity weakness.  Hematological:  Negative for adenopathy.  Psychiatric/Behavioral:  Negative for confusion.      PHYSICAL EXAMINATION: ECOG PERFORMANCE STATUS: 0 - Asymptomatic  Vitals:   09/06/23 0906  BP: 136/82  Pulse: 79  Resp: 18  Temp: 98 F (36.7 C)  SpO2: 98%   Filed Weights   09/06/23 0906  Weight: 170 lb 8 oz (77.3 kg)    Physical Exam Constitutional:      General: She is not in acute distress.    Appearance: She is not diaphoretic.  HENT:     Head: Normocephalic and atraumatic.  Eyes:     General: No scleral icterus. Cardiovascular:     Rate and Rhythm: Normal rate and regular rhythm.     Heart sounds: No murmur heard. Pulmonary:     Effort: Pulmonary effort is normal. No respiratory distress.     Breath sounds: No wheezing.  Abdominal:     General: There is no distension.     Palpations: Abdomen is soft.     Tenderness: There is no abdominal tenderness.  Musculoskeletal:        General: Normal range of motion.     Cervical back: Normal range of motion and neck supple.  Skin:    General: Skin is warm and dry.     Findings: No erythema.  Neurological:     Mental Status: She is alert and oriented to person, place, and time. Mental status is at baseline.     Motor: No abnormal muscle  tone.  Psychiatric:        Mood and Affect: Affect normal.      LABORATORY DATA:  I have reviewed the data as listed    Latest Ref Rng & Units 09/06/2023    8:54 AM 08/03/2023    2:26 PM 11/13/2022    3:53 PM  CBC  WBC 4.0 - 10.5 K/uL 8.4  10.6  7.0   Hemoglobin 12.0 - 15.0 g/dL 57.8  46.9  62.9   Hematocrit 36.0 - 46.0 % 40.4  39.2  41.2   Platelets 150 - 400 K/uL 295  316  225       Latest Ref Rng & Units 09/06/2023    8:54 AM 08/03/2023    2:26 PM 06/18/2023    8:36 AM  CMP  Glucose 70 - 99 mg/dL 528  413  244   BUN 8 - 23 mg/dL 14  14  13    Creatinine 0.44 - 1.00 mg/dL 0.10  2.72  5.36   Sodium 135 - 145 mmol/L 138  137  143   Potassium 3.5 - 5.1 mmol/L 4.1  3.7  3.8   Chloride 98 - 111 mmol/L 105  104  105   CO2 22 - 32 mmol/L 24  23  22    Calcium  8.9 - 10.3 mg/dL 9.3  9.5  9.7   Total Protein 6.5 - 8.1 g/dL 7.4  7.5  6.9   Total Bilirubin 0.0 - 1.2 mg/dL 0.7  0.7  0.4   Alkaline Phos 38 - 126 U/L 82  73  94   AST 15 - 41 U/L 16  19  15    ALT 0 - 44 U/L 20  22  16       RADIOGRAPHIC STUDIES: I have personally reviewed  the radiological images as listed and agreed with the findings in the report. IR IMAGING GUIDED PORT INSERTION Result Date: 08/29/2023 INDICATION: 63 year old female referred for port catheter EXAM: IMAGE GUIDED PORT CATHETER MEDICATIONS: None ANESTHESIA/SEDATION: Moderate (conscious) sedation was employed during this procedure. A total of Versed 2.5 mg and Fentanyl  225 mcg was administered intravenously. Moderate Sedation Time: 19 minutes. The patient's level of consciousness and vital signs were monitored continuously by radiology nursing throughout the procedure under my direct supervision. FLUOROSCOPY TIME:  Reference air kerma: 0 mGy COMPLICATIONS: None PROCEDURE: Informed written consent was obtained from the patient after a discussion of the risks, benefits, and alternatives to treatment. Questions regarding the procedure were encouraged and answered. The  right neck and chest were prepped with chlorhexidine  in a sterile fashion, and a sterile drape was applied covering the operative field. Maximum barrier sterile technique with sterile gowns and gloves were used for the procedure. A timeout was performed prior to the initiation of the procedure. Ultrasound survey was performed with images stored and sent to PACs. Right IJ vein documented to be patent. The right neck and chest was prepped with chlorhexidine , and draped in the usual sterile fashion using maximum barrier technique (cap and mask, sterile gown, sterile gloves, large sterile sheet, hand hygiene and cutaneous antiseptic). Local anesthesia was attained by infiltration with 1% lidocaine  without epinephrine . Ultrasound demonstrated patency of the right internal jugular vein, and this was documented with an image. Under real-time ultrasound guidance, this vein was accessed with a 21 gauge micropuncture needle and image documentation was performed. A small dermatotomy was made at the access site with an 11 scalpel. A 0.018" wire was advanced into the SVC and used to estimate the length of the internal catheter. The access needle exchanged for a 59F micropuncture vascular sheath. The 0.018" wire was then removed and a 0.035" wire advanced into the IVC. An appropriate location for the subcutaneous reservoir was selected below the clavicle and an incision was made through the skin and underlying soft tissues. The subcutaneous tissues were then dissected using a combination of blunt and sharp surgical technique and a pocket was formed. A single lumen power injectable portacatheter was then tunneled through the subcutaneous tissues from the pocket to the dermatotomy and the port reservoir placed within the subcutaneous pocket. The venous access site was then serially dilated and a peel away vascular sheath placed over the wire. The wire was removed and the port catheter advanced into position under fluoroscopic  guidance. The catheter tip is positioned in the cavoatrial junction. This was documented with a spot image. The portacatheter was then tested and found to flush and aspirate well. The port was flushed with saline followed by 100 units/mL heparinized saline. The pocket was then closed in two layers using first subdermal inverted interrupted absorbable sutures followed by a running subcuticular suture. The epidermis was then sealed with Dermabond. The dermatotomy at the venous access site was also seal with Dermabond. Patient tolerated the procedure well and remained hemodynamically stable throughout. No complications encountered and no significant blood loss encountered IMPRESSION: Status post right IJ port catheter placement Signed, Marciano Settles. Rexine Cater, RPVI [facility] Electronically Signed   By: Myrlene Asper D.O.   On: 08/29/2023 14:15   MR Brain W Wo Contrast Result Date: 08/13/2023 CLINICAL DATA:  Lung mass, evaluation for metastasis. EXAM: MRI HEAD WITHOUT AND WITH CONTRAST TECHNIQUE: Multiplanar, multiecho pulse sequences of the brain and surrounding structures  were obtained without and with intravenous contrast. CONTRAST:  7mL GADAVIST  GADOBUTROL  1 MMOL/ML IV SOLN COMPARISON:  None Available. FINDINGS: Brain: No acute infarct. No evidence of intracranial hemorrhage. Scattered foci of T2/FLAIR hyperintensity in the periventricular and subcortical white matter. No mass lesion or midline shift. Cerebellum is unremarkable. Normal appearance of midline structures. The basilar cisterns are patent. No extra-axial fluid collections. No abnormal intracranial enhancement. No intracranial lesions identified. Ventricles: Normal size and configuration of the ventricles. Vascular: Skull base flow voids are visualized. Skull and upper cervical spine: No focal abnormality. Sinuses/Orbits: Orbits are symmetric. Paranasal sinuses are clear. Other: Mastoid air  cells are clear. IMPRESSION: No acute intracranial abnormality. No evidence of intracranial metastatic disease. Mild chronic microvascular ischemic changes. Electronically Signed   By: Denny Flack M.D.   On: 08/13/2023 17:30   NM PET Image Initial (PI) Skull Base To Thigh Result Date: 08/09/2023 CLINICAL DATA:  Initial treatment strategy for right hilar mass and mediastinal adenopathy on CT, suspicious for malignancy. Recent bronchoscopy. EXAM: NUCLEAR MEDICINE PET SKULL BASE TO THIGH TECHNIQUE: 9.6 mCi F-18 FDG was injected intravenously. Full-ring PET imaging was performed from the skull base to thigh after the radiotracer. CT data was obtained and used for attenuation correction and anatomic localization. Fasting blood glucose: 97 mg/dl COMPARISON:  Chest CTA 08/03/2023. FINDINGS: Mediastinal blood pool activity: SUV max 2.9 NECK: No hypermetabolic cervical lymph nodes are identified. No suspicious activity identified within the pharyngeal mucosal space. Incidental CT findings: Bilateral carotid atherosclerosis. CHEST: Previously demonstrated right hilar mass is markedly hypermetabolic. This mass measures approximately 3.0 cm on image 53/6 and has an SUV max of 32.3. There is an adjacent enlarged hypermetabolic subcarinal node measuring 1.8 cm short axis with an SUV max of 30.5. 6 mm right paratracheal node on image 44/6 has an SUV max of 5.0. No contralateral hypermetabolic mediastinal or hilar lymph nodes. There is focal hypermetabolic activity peripherally in the anterior aspect of the right lower lobe (SUV max 7.4) corresponding with an area of probable postobstructive pneumonitis on the CT images. No well-defined nodule is identified. Compared with the prior CT, there is mildly improved aeration of the right middle lobe. No other peripheral hypermetabolic pulmonary activity. No suspicious metabolic activity or nodularity in the left lung. Incidental CT findings: Atherosclerosis of the aorta, great  vessels and coronary arteries. ABDOMEN/PELVIS: There is no hypermetabolic activity within the liver, adrenal glands, spleen or pancreas. There is no hypermetabolic nodal activity in the abdomen or pelvis. Incidental CT findings: Aortoiliac atherosclerosis. Prior cholecystectomy. SKELETON: There is no hypermetabolic activity to suggest osseous metastatic disease. Incidental CT findings: none IMPRESSION: 1. The right hilar mass is markedly hypermetabolic, consistent with primary bronchogenic carcinoma. Correlate with bronchoscopy results. 2. Adjacent hypermetabolic subcarinal and right paratracheal lymph nodes are consistent with metastatic disease. 3. Focal hypermetabolic activity peripherally in the anterior aspect of the right lower lobe corresponding with probable postobstructive pneumonitis on the CT images. This could reflect a small peripheral primary malignancy. No other suspicious peripheral pulmonary activity. 4. No evidence of distant metastatic disease. 5.  Aortic Atherosclerosis (ICD10-I70.0). Electronically Signed   By: Elmon Hagedorn M.D.   On: 08/09/2023 13:04   DG Chest Port 1 View Result Date: 08/08/2023 CLINICAL DATA:  Status post bronchoscopy EXAM: PORTABLE CHEST 1 VIEW COMPARISON:  CTA chest and x-ray 08/03/2023 FINDINGS: No pneumothorax or effusion. Stable cardiopericardial silhouette with calcified aorta. Decreasing right lung base opacities. No edema. Film is under penetrated. Film is rotated to the  right. Overlapping cardiac leads. The right hilar mass is not as well seen on this x-ray. Please correlate with prior CT IMPRESSION: No pneumothorax or effusion seen post bronchoscopy. Electronically Signed   By: Adrianna Horde M.D.   On: 08/08/2023 12:09

## 2023-09-06 NOTE — Assessment & Plan Note (Addendum)
 Images and pathology results were reviewed with the patient. cT2 N2 M0, Stage III right lung adenocarcinoma.  NGS showed TPS 5% CPS 10  KRAS G12C, TMB 12.6  Labs are reviewed and discussed with patient. Proceed with carboplatin AUC 2, Taxol 45mg /m2

## 2023-09-09 ENCOUNTER — Inpatient Hospital Stay: Attending: Oncology

## 2023-09-09 ENCOUNTER — Ambulatory Visit
Admission: RE | Admit: 2023-09-09 | Discharge: 2023-09-09 | Disposition: A | Discharge status: Home / Self Care | Source: Ambulatory Visit | Attending: Radiation Oncology | Admitting: Radiation Oncology

## 2023-09-09 ENCOUNTER — Other Ambulatory Visit: Payer: Self-pay | Admitting: Oncology

## 2023-09-09 ENCOUNTER — Other Ambulatory Visit: Payer: Self-pay

## 2023-09-09 ENCOUNTER — Encounter: Payer: Self-pay | Admitting: *Deleted

## 2023-09-09 VITALS — BP 126/81 | HR 88 | Temp 98.1°F | Resp 16 | Wt 168.7 lb

## 2023-09-09 DIAGNOSIS — C3491 Malignant neoplasm of unspecified part of right bronchus or lung: Secondary | ICD-10-CM | POA: Diagnosis present

## 2023-09-09 DIAGNOSIS — Z5111 Encounter for antineoplastic chemotherapy: Secondary | ICD-10-CM | POA: Insufficient documentation

## 2023-09-09 DIAGNOSIS — Z452 Encounter for adjustment and management of vascular access device: Secondary | ICD-10-CM | POA: Diagnosis not present

## 2023-09-09 DIAGNOSIS — R059 Cough, unspecified: Secondary | ICD-10-CM | POA: Insufficient documentation

## 2023-09-09 DIAGNOSIS — Z803 Family history of malignant neoplasm of breast: Secondary | ICD-10-CM | POA: Insufficient documentation

## 2023-09-09 DIAGNOSIS — R042 Hemoptysis: Secondary | ICD-10-CM | POA: Insufficient documentation

## 2023-09-09 DIAGNOSIS — Z800 Family history of malignant neoplasm of digestive organs: Secondary | ICD-10-CM | POA: Insufficient documentation

## 2023-09-09 DIAGNOSIS — Z87891 Personal history of nicotine dependence: Secondary | ICD-10-CM | POA: Diagnosis not present

## 2023-09-09 LAB — RAD ONC ARIA SESSION SUMMARY
Course Elapsed Days: 0
Plan Fractions Treated to Date: 1
Plan Prescribed Dose Per Fraction: 2 Gy
Plan Total Fractions Prescribed: 33
Plan Total Prescribed Dose: 66 Gy
Reference Point Dosage Given to Date: 2 Gy
Reference Point Session Dosage Given: 2 Gy
Session Number: 1

## 2023-09-09 MED ORDER — GUAIFENESIN-CODEINE 100-10 MG/5ML PO SOLN: 5.0000 mL | Freq: Four times a day (QID) | ORAL | 0 refills | Status: DC | PRN

## 2023-09-09 MED ORDER — FAMOTIDINE IN NACL 20-0.9 MG/50ML-% IV SOLN
20.0000 mg | Freq: Once | INTRAVENOUS | Status: AC
  Administered 2023-09-09: 20 mg via INTRAVENOUS
  Filled 2023-09-09: qty 50

## 2023-09-09 MED ORDER — DIPHENHYDRAMINE HCL 50 MG/ML IJ SOLN
50.0000 mg | Freq: Once | INTRAMUSCULAR | Status: AC
  Administered 2023-09-09: 50 mg via INTRAVENOUS
  Filled 2023-09-09: qty 1

## 2023-09-09 MED ORDER — SODIUM CHLORIDE 0.9 % IV SOLN
231.6000 mg | Freq: Once | INTRAVENOUS | Status: AC
  Administered 2023-09-09: 230 mg via INTRAVENOUS
  Filled 2023-09-09: qty 23

## 2023-09-09 MED ORDER — SODIUM CHLORIDE 0.9 % IV SOLN
INTRAVENOUS | Status: DC
  Filled 2023-09-09: qty 250

## 2023-09-09 MED ORDER — HEPARIN SOD (PORK) LOCK FLUSH 100 UNIT/ML IV SOLN
500.0000 [IU] | Freq: Once | INTRAVENOUS | Status: AC | PRN
  Administered 2023-09-09: 500 [IU]
  Filled 2023-09-09: qty 5

## 2023-09-09 MED ORDER — PALONOSETRON HCL INJECTION 0.25 MG/5ML
0.2500 mg | Freq: Once | INTRAVENOUS | Status: AC
  Administered 2023-09-09: 0.25 mg via INTRAVENOUS
  Filled 2023-09-09: qty 5

## 2023-09-09 MED ORDER — DEXAMETHASONE SODIUM PHOSPHATE 10 MG/ML IJ SOLN
10.0000 mg | Freq: Once | INTRAMUSCULAR | Status: AC
  Administered 2023-09-09: 10 mg via INTRAVENOUS
  Filled 2023-09-09: qty 1

## 2023-09-09 MED ORDER — SODIUM CHLORIDE 0.9 % IV SOLN
45.0000 mg/m2 | Freq: Once | INTRAVENOUS | Status: AC
  Administered 2023-09-09: 84 mg via INTRAVENOUS
  Filled 2023-09-09: qty 14

## 2023-09-09 NOTE — Progress Notes (Signed)
 Met with patient during infusion. Pt did not have any barriers or needs at this time. All questions answered during visit. Instructed pt to call with any questions or needs. Pt verbalized understanding.

## 2023-09-09 NOTE — Patient Instructions (Signed)

## 2023-09-10 ENCOUNTER — Encounter: Payer: Self-pay | Admitting: Oncology

## 2023-09-10 ENCOUNTER — Ambulatory Visit
Admission: RE | Admit: 2023-09-10 | Discharge: 2023-09-10 | Disposition: A | Discharge status: Home / Self Care | Source: Ambulatory Visit | Attending: Radiation Oncology | Admitting: Radiation Oncology

## 2023-09-10 ENCOUNTER — Telehealth: Payer: Self-pay

## 2023-09-10 ENCOUNTER — Other Ambulatory Visit: Payer: Self-pay

## 2023-09-10 DIAGNOSIS — Z5111 Encounter for antineoplastic chemotherapy: Secondary | ICD-10-CM | POA: Diagnosis not present

## 2023-09-10 LAB — RAD ONC ARIA SESSION SUMMARY
Course Elapsed Days: 1
Plan Fractions Treated to Date: 2
Plan Prescribed Dose Per Fraction: 2 Gy
Plan Total Fractions Prescribed: 33
Plan Total Prescribed Dose: 66 Gy
Reference Point Dosage Given to Date: 4 Gy
Reference Point Session Dosage Given: 2 Gy
Session Number: 2

## 2023-09-10 NOTE — Telephone Encounter (Signed)
Telephone call to patient for follow up after receiving first infusion.   Patient states infusion went great.  States eating good and drinking plenty of fluids.   Denies any nausea or vomiting.  Encouraged patient to call for any concerns or questions. 

## 2023-09-11 ENCOUNTER — Ambulatory Visit
Admission: RE | Admit: 2023-09-11 | Discharge: 2023-09-11 | Disposition: A | Discharge status: Home / Self Care | Source: Ambulatory Visit | Attending: Radiation Oncology | Admitting: Radiation Oncology

## 2023-09-11 ENCOUNTER — Other Ambulatory Visit: Payer: Self-pay

## 2023-09-11 DIAGNOSIS — Z5111 Encounter for antineoplastic chemotherapy: Secondary | ICD-10-CM | POA: Diagnosis not present

## 2023-09-11 LAB — RAD ONC ARIA SESSION SUMMARY
Course Elapsed Days: 2
Plan Fractions Treated to Date: 3
Plan Prescribed Dose Per Fraction: 2 Gy
Plan Total Fractions Prescribed: 33
Plan Total Prescribed Dose: 66 Gy
Reference Point Dosage Given to Date: 6 Gy
Reference Point Session Dosage Given: 2 Gy
Session Number: 3

## 2023-09-12 ENCOUNTER — Encounter: Payer: Self-pay | Admitting: Oncology

## 2023-09-12 ENCOUNTER — Ambulatory Visit
Admission: RE | Admit: 2023-09-12 | Discharge: 2023-09-12 | Disposition: A | Discharge status: Home / Self Care | Source: Ambulatory Visit | Attending: Radiation Oncology | Admitting: Radiation Oncology

## 2023-09-12 ENCOUNTER — Other Ambulatory Visit: Payer: Self-pay

## 2023-09-12 DIAGNOSIS — Z5111 Encounter for antineoplastic chemotherapy: Secondary | ICD-10-CM | POA: Diagnosis not present

## 2023-09-12 LAB — RAD ONC ARIA SESSION SUMMARY
Course Elapsed Days: 3
Plan Fractions Treated to Date: 4
Plan Prescribed Dose Per Fraction: 2 Gy
Plan Total Fractions Prescribed: 33
Plan Total Prescribed Dose: 66 Gy
Reference Point Dosage Given to Date: 8 Gy
Reference Point Session Dosage Given: 2 Gy
Session Number: 4

## 2023-09-13 ENCOUNTER — Ambulatory Visit
Admission: RE | Admit: 2023-09-13 | Discharge: 2023-09-13 | Disposition: A | Discharge status: Home / Self Care | Source: Ambulatory Visit | Attending: Radiation Oncology | Admitting: Radiation Oncology

## 2023-09-13 ENCOUNTER — Other Ambulatory Visit: Payer: Self-pay

## 2023-09-13 DIAGNOSIS — Z5111 Encounter for antineoplastic chemotherapy: Secondary | ICD-10-CM | POA: Diagnosis not present

## 2023-09-13 LAB — RAD ONC ARIA SESSION SUMMARY
Course Elapsed Days: 4
Plan Fractions Treated to Date: 5
Plan Prescribed Dose Per Fraction: 2 Gy
Plan Total Fractions Prescribed: 33
Plan Total Prescribed Dose: 66 Gy
Reference Point Dosage Given to Date: 10 Gy
Reference Point Session Dosage Given: 2 Gy
Session Number: 5

## 2023-09-16 ENCOUNTER — Inpatient Hospital Stay

## 2023-09-16 ENCOUNTER — Inpatient Hospital Stay (HOSPITAL_BASED_OUTPATIENT_CLINIC_OR_DEPARTMENT_OTHER): Admitting: Oncology

## 2023-09-16 ENCOUNTER — Ambulatory Visit
Admission: RE | Admit: 2023-09-16 | Discharge: 2023-09-16 | Disposition: A | Discharge status: Home / Self Care | Source: Ambulatory Visit | Attending: Radiation Oncology | Admitting: Radiation Oncology

## 2023-09-16 ENCOUNTER — Other Ambulatory Visit: Payer: Self-pay

## 2023-09-16 ENCOUNTER — Encounter: Payer: Self-pay | Admitting: Oncology

## 2023-09-16 VITALS — BP 115/70 | HR 95 | Temp 96.6°F | Resp 17

## 2023-09-16 VITALS — BP 124/75 | HR 98 | Temp 98.0°F | Resp 18 | Ht 64.0 in | Wt 168.0 lb

## 2023-09-16 DIAGNOSIS — Z5111 Encounter for antineoplastic chemotherapy: Secondary | ICD-10-CM

## 2023-09-16 DIAGNOSIS — C3491 Malignant neoplasm of unspecified part of right bronchus or lung: Secondary | ICD-10-CM

## 2023-09-16 LAB — CMP (CANCER CENTER ONLY)
ALT: 25 U/L (ref 0–44)
AST: 24 U/L (ref 15–41)
Albumin: 4.1 g/dL (ref 3.5–5.0)
Alkaline Phosphatase: 76 U/L (ref 38–126)
Anion gap: 8 (ref 5–15)
BUN: 15 mg/dL (ref 8–23)
CO2: 23 mmol/L (ref 22–32)
Calcium: 8.7 mg/dL — ABNORMAL LOW (ref 8.9–10.3)
Chloride: 105 mmol/L (ref 98–111)
Creatinine: 0.73 mg/dL (ref 0.44–1.00)
GFR, Estimated: >60 mL/min (ref >60)
Glucose, Bld: 102 mg/dL — ABNORMAL HIGH (ref 70–99)
Potassium: 3.7 mmol/L (ref 3.5–5.1)
Sodium: 136 mmol/L (ref 135–145)
Total Bilirubin: 0.6 mg/dL (ref 0.0–1.2)
Total Protein: 7.2 g/dL (ref 6.5–8.1)

## 2023-09-16 LAB — CBC WITH DIFFERENTIAL (CANCER CENTER ONLY)
Abs Immature Granulocytes: 0.03 10*3/uL (ref 0.00–0.07)
Basophils Absolute: 0.1 10*3/uL (ref 0.0–0.1)
Basophils Relative: 1 %
Eosinophils Absolute: 0.2 10*3/uL (ref 0.0–0.5)
Eosinophils Relative: 3 %
HCT: 37.6 % (ref 36.0–46.0)
Hemoglobin: 12.5 g/dL (ref 12.0–15.0)
Immature Granulocytes: 1 %
Lymphocytes Relative: 23 %
Lymphs Abs: 1.2 10*3/uL (ref 0.7–4.0)
MCH: 27.8 pg (ref 26.0–34.0)
MCHC: 33.2 g/dL (ref 30.0–36.0)
MCV: 83.7 fL (ref 80.0–100.0)
Monocytes Absolute: 0.3 10*3/uL (ref 0.1–1.0)
Monocytes Relative: 6 %
Neutro Abs: 3.5 10*3/uL (ref 1.7–7.7)
Neutrophils Relative %: 66 %
Platelet Count: 300 10*3/uL (ref 150–400)
RBC: 4.49 MIL/uL (ref 3.87–5.11)
RDW: 12.5 % (ref 11.5–15.5)
WBC Count: 5.3 10*3/uL (ref 4.0–10.5)
nRBC: 0 % (ref 0.0–0.2)

## 2023-09-16 LAB — RAD ONC ARIA SESSION SUMMARY
Course Elapsed Days: 7
Plan Fractions Treated to Date: 6
Plan Prescribed Dose Per Fraction: 2 Gy
Plan Total Fractions Prescribed: 33
Plan Total Prescribed Dose: 66 Gy
Reference Point Dosage Given to Date: 12 Gy
Reference Point Session Dosage Given: 2 Gy
Session Number: 6

## 2023-09-16 MED ORDER — DIPHENHYDRAMINE HCL 50 MG/ML IJ SOLN
50.0000 mg | Freq: Once | INTRAMUSCULAR | Status: AC
  Administered 2023-09-16: 50 mg via INTRAVENOUS
  Filled 2023-09-16: qty 1

## 2023-09-16 MED ORDER — SODIUM CHLORIDE 0.9 % IV SOLN
231.6000 mg | Freq: Once | INTRAVENOUS | Status: AC
  Administered 2023-09-16: 230 mg via INTRAVENOUS
  Filled 2023-09-16: qty 23

## 2023-09-16 MED ORDER — SODIUM CHLORIDE 0.9 % IV SOLN
INTRAVENOUS | Status: DC
  Filled 2023-09-16: qty 250

## 2023-09-16 MED ORDER — HEPARIN SOD (PORK) LOCK FLUSH 100 UNIT/ML IV SOLN
500.0000 [IU] | Freq: Once | INTRAVENOUS | Status: AC | PRN
  Administered 2023-09-16: 500 [IU]
  Filled 2023-09-16: qty 5

## 2023-09-16 MED ORDER — PALONOSETRON HCL INJECTION 0.25 MG/5ML
0.2500 mg | Freq: Once | INTRAVENOUS | Status: AC
  Administered 2023-09-16: 0.25 mg via INTRAVENOUS
  Filled 2023-09-16: qty 5

## 2023-09-16 MED ORDER — DEXAMETHASONE SODIUM PHOSPHATE 10 MG/ML IJ SOLN
10.0000 mg | Freq: Once | INTRAMUSCULAR | Status: AC
  Administered 2023-09-16: 10 mg via INTRAVENOUS
  Filled 2023-09-16: qty 1

## 2023-09-16 MED ORDER — FAMOTIDINE IN NACL 20-0.9 MG/50ML-% IV SOLN
20.0000 mg | Freq: Once | INTRAVENOUS | Status: AC
  Administered 2023-09-16: 20 mg via INTRAVENOUS
  Filled 2023-09-16: qty 50

## 2023-09-16 MED ORDER — SODIUM CHLORIDE 0.9 % IV SOLN
45.0000 mg/m2 | Freq: Once | INTRAVENOUS | Status: AC
  Administered 2023-09-16: 84 mg via INTRAVENOUS
  Filled 2023-09-16: qty 14

## 2023-09-16 NOTE — Assessment & Plan Note (Signed)
 Recommend calcium  supplementation 600mg  daily

## 2023-09-16 NOTE — Assessment & Plan Note (Signed)
 Chemotherapy treatment as planned above

## 2023-09-16 NOTE — Progress Notes (Signed)
 Patient feels like her cough is about the same. She didn't know that she was supposed to be taking her dexamethasone  two times daily for two days after chemo, which she said that she would start doing. She did have a little bit of constipation, and nausea, but they both went away on there own.

## 2023-09-16 NOTE — Assessment & Plan Note (Addendum)
 Images and pathology results were reviewed with the patient. cT2 N2 M0, Stage III right lung adenocarcinoma.  NGS showed TPS 5% CPS 10  KRAS G12C, TMB 12.6  Labs are reviewed and discussed with patient. Proceed with carboplatin AUC 2, Taxol 45mg /m2

## 2023-09-16 NOTE — Progress Notes (Signed)
 Hematology/Oncology Progress note [phone] [phone]       CHIEF COMPLAINTS/PURPOSE OF CONSULTATION:  Stage III lung adenocarcinoam  ASSESSMENT & PLAN:   Cancer Staging  Primary lung adenocarcinoma, right [facility]) Staging form: Lung, AJCC V9 - Clinical stage from 08/15/2023: Tanya Howell, cM0 - Signed by Timmy Forbes, MD on 08/15/2023   Primary lung adenocarcinoma, right [facility]) Images and pathology results were reviewed with the patient. cT2 N2 M0, Stage III right lung adenocarcinoma.  NGS showed TPS 5% CPS 10  KRAS G12C, TMB 12.6  Labs are reviewed and discussed with patient. Proceed with carboplatin  AUC 2, Taxol  45mg /m2      Encounter for antineoplastic chemotherapy Chemotherapy treatment as planned above  Hypocalcemia Recommend calcium  supplementation 600mg  daily    Orders Placed This Encounter  Procedures   CBC with Differential (Cancer Center Only)    Standing Status:   Future    Expected Date:   09/30/2023    Expiration Date:   09/29/2024   CMP (Cancer Center only)    Standing Status:   Future    Expected Date:   09/30/2023    Expiration Date:   09/29/2024   CBC with Differential (Cancer Center Only)    Standing Status:   Future    Expected Date:   10/07/2023    Expiration Date:   10/06/2024   CMP (Cancer Center only)    Standing Status:   Future    Expected Date:   10/07/2023    Expiration Date:   10/06/2024   CBC with Differential (Cancer Center Only)    Standing Status:   Future    Expected Date:   10/14/2023    Expiration Date:   10/13/2024   CMP (Cancer Center only)    Standing Status:   Future    Expected Date:   10/14/2023    Expiration Date:   10/13/2024   CBC with Differential (Cancer Center Only)    Standing Status:   Future    Expected Date:   10/21/2023    Expiration Date:   10/20/2024   CMP (Cancer Center only)    Standing Status:   Future    Expected Date:   10/21/2023    Expiration Date:   10/20/2024   Follow-up 1 week All questions  were answered. The patient knows to call the clinic with any problems, questions or concerns.  Timmy Forbes, MD, PhD [facility] Hematology Oncology 09/16/2023    HISTORY OF PRESENTING ILLNESS:  Tanya Howell 63 y.o. female presents to establish care for lung cancer  Oncology History  Primary lung adenocarcinoma, right (HCC)  08/03/2023 Imaging   CT angiogram chest PE protocol showed  1. 3.5 x 2.6 cm right hilar mass, with obstruction of the bronchus intermedius, right middle lobe bronchus, and portions of the right lower lobe bronchus as above, highly concerning for neoplasm. Bronchoscopy is recommended for further evaluation. 2. Subcarinal adenopathy concerning for metastatic disease. 3. Dense medial segment consolidation within the right middle lobe with associated volume loss, consistent with atelectasis. 4. Prominent right lower lobe bronchiectasis, with fluid-filled bronchi and patchy right basilar consolidation consistent with combination of airspace disease and atelectasis. 5. No evidence of pulmonary embolus. 6. Aortic Atherosclerosis (ICD10-I70.0). Coronary artery atherosclerosis.   08/06/2023 Initial Diagnosis   Primary lung adenocarcinoma, right (HCC)  08/03/2023, patient presented to emergency room for evaluation of hemoptysis. Patient had pneumonia in March 2025, she continues to have a lingering cough for the past months despite being  treated with doxycycline  and prednisone . She coughed up small amount of bright red blood on 08/03/2023.  Denies any shortness of breath, chest pain unintentional weight loss headache.  She felt feverish with chills 2 nights ago. Patient is a former smoker, quit smoking in 2011. Patient denies being on any antiplatelet or anticoagulation agents.  08/08/2023 She underwent biopsy via bronchoscopy.  FINAL MICROSCOPIC DIAGNOSIS:  A. LUNG, RLL, BRUSHING:  - Adenocarcinoma   B. LUNG, RLL, FINE NEEDLE ASPIRATION:  - Adenocarcinoma    Immunohistochemical stains were performed to characterize the tumor cells. The cells are negative for TTF-1, Napsin A, p40, synaptophysin, Chromogranin A, and CD56.  Mucicarmine stain highlights intracytoplasmic mucin. Ki67 proliferation index is approximately 30%. The overall findings are supportive of the diagnosis of adenocarcinoma.   C. LYMPH NODE, 7, FINE NEEDLE ASPIRATION:  - Adenocarcinoma  - Lymphoid tissue present   D. LYMPH NODE, 4R, FINE NEEDLE ASPIRATION:  - No malignant cells identified  - Lymphoid tissue present   E. LYMPH NODE, 11R, FINE NEEDLE ASPIRATION:  - No malignant cells identified  - Lymphoid tissue present   F. LUNG, RLL, LAVAGE:  FINAL MICROSCOPIC DIAGNOSIS:  - Atypical cells present    08/15/2023 Cancer Staging   Staging form: Lung, AJCC V9 - Clinical stage from 08/15/2023: Tanya Howell, cM0 - Signed by Timmy Forbes, MD on 08/15/2023 Stage prefix: Initial diagnosis Method of lymph node assessment: Clinical   09/09/2023 -  Chemotherapy   Patient is on Treatment Plan : LUNG Carboplatin  + Paclitaxel  + XRT q7d      She has no new complaints except intermittent cough.  She has tried cough suppressant, not very effective and caused constipation. She has not used much.   MEDICAL HISTORY:  Past Medical History:  Diagnosis Date   COVID-19 07/2018   Hyperlipidemia    Hypertension    Motion sickness    amuesment park rides   Osteopenia 11/01/2015   Pneumonia    april 2025   Psoriasis    Vertigo    none for over 5 yrs   Wears dentures    full upper and lower    SURGICAL HISTORY: Past Surgical History:  Procedure Laterality Date   BRONCHIAL BIOPSY  08/08/2023   Procedure: BRONCHOSCOPY, WITH BIOPSY;  Surgeon: Annitta Kindler, MD;  Location: [facility] ENDOSCOPY;  Service: Pulmonary;;   BRONCHIAL BRUSHINGS  08/08/2023   Procedure: BRONCHOSCOPY, WITH BRUSH BIOPSY;  Surgeon: Annitta Kindler, MD;  Location: [facility] ENDOSCOPY;  Service: Pulmonary;;   BRONCHIAL NEEDLE  ASPIRATION BIOPSY  08/08/2023   Procedure: BRONCHOSCOPY, WITH NEEDLE ASPIRATION BIOPSY;  Surgeon: Annitta Kindler, MD;  Location: [facility] ENDOSCOPY;  Service: Pulmonary;;   CHOLECYSTECTOMY  1990   COLONOSCOPY WITH PROPOFOL  N/A 07/15/2020   Procedure: COLONOSCOPY WITH PROPOFOL ;  Surgeon: Marnee Sink, MD;  [facility];  Service: Endoscopy;  Laterality: N/A;  priority 4   ENDOBRONCHIAL ULTRASOUND Bilateral 08/08/2023   Procedure: ENDOBRONCHIAL ULTRASOUND (EBUS);  Surgeon: Annitta Kindler, MD;  Location: [facility] ENDOSCOPY;  Service: Pulmonary;  Laterality: Bilateral;   IR IMAGING GUIDED PORT INSERTION  08/29/2023   TUBAL LIGATION      SOCIAL HISTORY: Social History   Socioeconomic History   Marital status: Married    Spouse name: Lavonia Powers   Number of children: 1   Years of education: Not on file   Highest education level: High school graduate  Occupational History   Not on file  Tobacco Use   Smoking status: Former    Current packs/day:  0.00    Average packs/day: 1 pack/day for 28.0 years (28.0 ttl pk-yrs)    Types: Cigarettes    Start date: 12    Quit date: 2011    Years since quitting: 14.4   Smokeless tobacco: Never   Tobacco comments:    Started smoking around 63 yrs old.    Smoked 1PPD at her heaviest.    Quit smoking in 2011- [name] 08/07/2023  Vaping Use   Vaping status: Former  Substance and Sexual Activity   Alcohol use: No    Alcohol/week: 0.0 standard drinks of alcohol    Comment: rare   Drug use: No   Sexual activity: Yes    Birth control/protection: Surgical  Other Topics Concern   Not on file  Social History Narrative   Not on file   Social Drivers of Health   Financial Resource Strain: Low Risk  (05/06/2018)   Overall Financial Resource Strain (CARDIA)    Difficulty of Paying Living Expenses: Not hard at all  Food Insecurity: No Food Insecurity (05/06/2018)   Hunger Vital Sign    Worried About Running Out of Food in the Last Year: Never true     Ran Out of Food in the Last Year: Never true  Transportation Needs: No Transportation Needs (11/10/2019)   PRAPARE - Administrator, Civil Service (Medical): No    Lack of Transportation (Non-Medical): No  Physical Activity: Sufficiently Active (05/06/2018)   Exercise Vital Sign    Days of Exercise per Week: 7 days    Minutes of Exercise per Session: 30 min  Stress: No Stress Concern Present (05/06/2018)   [employer] of Occupational Health - Occupational Stress Questionnaire    Feeling of Stress : Not at all  Social Connections: Somewhat Isolated (05/06/2018)   Social Connection and Isolation Panel [NHANES]    Frequency of Communication with Friends and Family: More than three times a week    Frequency of Social Gatherings with Friends and Family: Twice a week    Attends Religious Services: Never    Database administrator or Organizations: No    Attends Banker Meetings: Never    Marital Status: Married  Catering manager Violence: Not At Risk (11/10/2019)   Humiliation, Afraid, Rape, and Kick questionnaire    Fear of Current or Ex-Partner: No    Emotionally Abused: No    Physically Abused: No    Sexually Abused: No    FAMILY HISTORY: Family History  Problem Relation Age of Onset   Colon cancer Brother        57    Breast cancer Mother    Breast cancer Maternal Aunt    Pneumonia Father    Diabetes Maternal Grandmother     ALLERGIES:  is allergic to lisinopril .  MEDICATIONS:  Current Outpatient Medications  Medication Sig Dispense Refill   atorvastatin  (LIPITOR) 10 MG tablet TAKE 1 TABLET BY MOUTH AT BEDTIME 90 tablet 1   benzonatate  (TESSALON  PERLES) 100 MG capsule Take 1 capsule (100 mg total) by mouth 2 (two) times daily as needed for cough. 30 capsule 1   dexamethasone  (DECADRON ) 4 MG tablet Take 2 tablets daily for 2 days, start the day after chemotherapy. Take with food. 30 tablet 1   guaifenesin  (ROBITUSSIN) 100 MG/5ML syrup Take 200 mg by  mouth 3 (three) times daily as needed for cough.     guaiFENesin -codeine  100-10 MG/5ML syrup Take 5 mLs by mouth every 6 (six) hours as needed  for cough. 118 mL 0   levalbuterol  (XOPENEX  HFA) 45 MCG/ACT inhaler Inhale 2 puffs into the lungs every 6 (six) hours as needed for wheezing. 15 g 3   lidocaine -prilocaine  (EMLA ) cream Apply to affected area once 30 g 3   losartan  (COZAAR ) 25 MG tablet Take 0.5 tablets (12.5 mg total) by mouth daily. 45 tablet 1   meclizine  (ANTIVERT ) 25 MG tablet Take 1 tablet (25 mg total) by mouth 3 (three) times daily as needed for dizziness. 30 tablet 3   ondansetron  (ZOFRAN ) 8 MG tablet Take 1 tablet (8 mg total) by mouth every 8 (eight) hours as needed for nausea or vomiting. Start on the third day after chemotherapy. 30 tablet 1   prochlorperazine  (COMPAZINE ) 10 MG tablet Take 1 tablet (10 mg total) by mouth every 6 (six) hours as needed for nausea or vomiting. 30 tablet 1   triamcinolone  ointment (KENALOG ) 0.5 % Apply 1 Application topically 2 (two) times daily. 30 g 6   No current facility-administered medications for this visit.   Facility-Administered Medications Ordered in Other Visits  Medication Dose Route Frequency Provider Last Rate Last Admin   0.9 %  sodium chloride  infusion   Intravenous Continuous Timmy Forbes, MD   Stopped at 09/16/23 1320    Review of Systems  Constitutional:  Negative for appetite change, chills, fatigue and fever.  HENT:   Negative for hearing loss and voice change.   Eyes:  Negative for eye problems.  Respiratory:  Positive for cough. Negative for chest tightness and hemoptysis.   Cardiovascular:  Negative for chest pain.  Gastrointestinal:  Negative for abdominal distention, abdominal pain and blood in stool.  Endocrine: Negative for hot flashes.  Genitourinary:  Negative for difficulty urinating and frequency.   Musculoskeletal:  Negative for arthralgias.  Skin:  Negative for itching and rash.  Neurological:  Negative for  extremity weakness.  Hematological:  Negative for adenopathy.  Psychiatric/Behavioral:  Negative for confusion.      PHYSICAL EXAMINATION: ECOG PERFORMANCE STATUS: 0 - Asymptomatic  Vitals:   09/16/23 0930  BP: 124/75  Pulse: 98  Resp: 18  Temp: 98 F (36.7 C)  SpO2: 97%   Filed Weights   09/16/23 0930  Weight: 168 lb (76.2 kg)    Physical Exam Constitutional:      General: She is not in acute distress.    Appearance: She is not diaphoretic.  HENT:     Head: Normocephalic and atraumatic.  Eyes:     General: No scleral icterus. Cardiovascular:     Rate and Rhythm: Normal rate and regular rhythm.     Heart sounds: No murmur heard. Pulmonary:     Effort: Pulmonary effort is normal. No respiratory distress.     Breath sounds: No wheezing.  Abdominal:     General: There is no distension.     Palpations: Abdomen is soft.     Tenderness: There is no abdominal tenderness.  Musculoskeletal:        General: Normal range of motion.     Cervical back: Normal range of motion and neck supple.  Skin:    General: Skin is warm and dry.     Findings: No erythema.  Neurological:     Mental Status: She is alert and oriented to person, place, and time. Mental status is at baseline.     Motor: No abnormal muscle tone.  Psychiatric:        Mood and Affect: Affect normal.  LABORATORY DATA:  I have reviewed the data as listed    Latest Ref Rng & Units 09/16/2023    9:07 AM 09/06/2023    8:54 AM 08/03/2023    2:26 PM  CBC  WBC 4.0 - 10.5 K/uL 5.3  8.4  10.6   Hemoglobin 12.0 - 15.0 g/dL 16.1  09.6  04.5   Hematocrit 36.0 - 46.0 % 37.6  40.4  39.2   Platelets 150 - 400 K/uL 300  295  316       Latest Ref Rng & Units 09/16/2023    9:07 AM 09/06/2023    8:54 AM 08/03/2023    2:26 PM  CMP  Glucose 70 - 99 mg/dL 409  811  914   BUN 8 - 23 mg/dL 15  14  14    Creatinine 0.44 - 1.00 mg/dL 7.82  9.56  2.13   Sodium 135 - 145 mmol/L 136  138  137   Potassium 3.5 - 5.1 mmol/L 3.7   4.1  3.7   Chloride 98 - 111 mmol/L 105  105  104   CO2 22 - 32 mmol/L 23  24  23    Calcium  8.9 - 10.3 mg/dL 8.7  9.3  9.5   Total Protein 6.5 - 8.1 g/dL 7.2  7.4  7.5   Total Bilirubin 0.0 - 1.2 mg/dL 0.6  0.7  0.7   Alkaline Phos 38 - 126 U/L 76  82  73   AST 15 - 41 U/L 24  16  19    ALT 0 - 44 U/L 25  20  22       RADIOGRAPHIC STUDIES: I have personally reviewed the radiological images as listed and agreed with the findings in the report. IR IMAGING GUIDED PORT INSERTION Result Date: 08/29/2023 INDICATION: 63 year old female referred for port catheter EXAM: IMAGE GUIDED PORT CATHETER MEDICATIONS: None ANESTHESIA/SEDATION: Moderate (conscious) sedation was employed during this procedure. A total of Versed  2.5 mg and Fentanyl  225 mcg was administered intravenously. Moderate Sedation Time: 19 minutes. The patient's level of consciousness and vital signs were monitored continuously by radiology nursing throughout the procedure under my direct supervision. FLUOROSCOPY TIME:  Reference air kerma: 0 mGy COMPLICATIONS: None PROCEDURE: Informed written consent was obtained from the patient after a discussion of the risks, benefits, and alternatives to treatment. Questions regarding the procedure were encouraged and answered. The right neck and chest were prepped with chlorhexidine  in a sterile fashion, and a sterile drape was applied covering the operative field. Maximum barrier sterile technique with sterile gowns and gloves were used for the procedure. A timeout was performed prior to the initiation of the procedure. Ultrasound survey was performed with images stored and sent to PACs. Right IJ vein documented to be patent. The right neck and chest was prepped with chlorhexidine , and draped in the usual sterile fashion using maximum barrier technique (cap and mask, sterile gown, sterile gloves, large sterile sheet, hand hygiene and cutaneous antiseptic). Local anesthesia was attained by infiltration with  1% lidocaine  without epinephrine . Ultrasound demonstrated patency of the right internal jugular vein, and this was documented with an image. Under real-time ultrasound guidance, this vein was accessed with a 21 gauge micropuncture needle and image documentation was performed. A small dermatotomy was made at the access site with an 11 scalpel. A 0.018" wire was advanced into the SVC and used to estimate the length of the internal catheter. The access needle exchanged for a 47F micropuncture vascular sheath. The 0.018" wire was  then removed and a 0.035" wire advanced into the IVC. An appropriate location for the subcutaneous reservoir was selected below the clavicle and an incision was made through the skin and underlying soft tissues. The subcutaneous tissues were then dissected using a combination of blunt and sharp surgical technique and a pocket was formed. A single lumen power injectable portacatheter was then tunneled through the subcutaneous tissues from the pocket to the dermatotomy and the port reservoir placed within the subcutaneous pocket. The venous access site was then serially dilated and a peel away vascular sheath placed over the wire. The wire was removed and the port catheter advanced into position under fluoroscopic guidance. The catheter tip is positioned in the cavoatrial junction. This was documented with a spot image. The portacatheter was then tested and found to flush and aspirate well. The port was flushed with saline followed by 100 units/mL heparinized saline. The pocket was then closed in two layers using first subdermal inverted interrupted absorbable sutures followed by a running subcuticular suture. The epidermis was then sealed with Dermabond. The dermatotomy at the venous access site was also seal with Dermabond. Patient tolerated the procedure well and remained hemodynamically stable throughout. No complications encountered and no significant blood loss encountered IMPRESSION: Status  post right IJ port catheter placement Signed, Marciano Settles. Rexine Cater, RPVI [facility] Radiology Electronically Signed   By: Myrlene Asper D.O.   On: 08/29/2023 14:15

## 2023-09-17 ENCOUNTER — Inpatient Hospital Stay

## 2023-09-17 ENCOUNTER — Other Ambulatory Visit: Payer: Self-pay

## 2023-09-17 ENCOUNTER — Ambulatory Visit
Admission: RE | Admit: 2023-09-17 | Discharge: 2023-09-17 | Disposition: A | Discharge status: Home / Self Care | Source: Ambulatory Visit | Attending: Radiation Oncology | Admitting: Radiation Oncology

## 2023-09-17 DIAGNOSIS — Z5111 Encounter for antineoplastic chemotherapy: Secondary | ICD-10-CM | POA: Diagnosis not present

## 2023-09-17 LAB — RAD ONC ARIA SESSION SUMMARY
Course Elapsed Days: 8
Plan Fractions Treated to Date: 7
Plan Prescribed Dose Per Fraction: 2 Gy
Plan Total Fractions Prescribed: 33
Plan Total Prescribed Dose: 66 Gy
Reference Point Dosage Given to Date: 14 Gy
Reference Point Session Dosage Given: 2 Gy
Session Number: 7

## 2023-09-17 NOTE — Progress Notes (Signed)
 Nutrition  Patient did not show up for scheduled nutrition visit today.  Message sent to scheduling to offer another appointment  Jaxson Keener B. Zollie Hipp, CSO, LDN Registered Dietitian [phone]

## 2023-09-18 ENCOUNTER — Encounter: Payer: Self-pay | Admitting: *Deleted

## 2023-09-18 ENCOUNTER — Ambulatory Visit
Admission: RE | Admit: 2023-09-18 | Discharge: 2023-09-18 | Disposition: A | Discharge status: Home / Self Care | Source: Ambulatory Visit | Attending: Radiation Oncology | Admitting: Radiation Oncology

## 2023-09-18 ENCOUNTER — Other Ambulatory Visit: Payer: Self-pay

## 2023-09-18 ENCOUNTER — Telehealth: Payer: Self-pay | Admitting: *Deleted

## 2023-09-18 DIAGNOSIS — Z5111 Encounter for antineoplastic chemotherapy: Secondary | ICD-10-CM | POA: Diagnosis not present

## 2023-09-18 LAB — RAD ONC ARIA SESSION SUMMARY
Course Elapsed Days: 9
Plan Fractions Treated to Date: 8
Plan Prescribed Dose Per Fraction: 2 Gy
Plan Total Fractions Prescribed: 33
Plan Total Prescribed Dose: 66 Gy
Reference Point Dosage Given to Date: 16 Gy
Reference Point Session Dosage Given: 2 Gy
Session Number: 8

## 2023-09-18 NOTE — Telephone Encounter (Signed)
 Entered in error

## 2023-09-18 NOTE — Progress Notes (Signed)
 Met with patient after radiation treatment today. All questions answered during visit. Pt states that she is experiencing constipation and has been taking metamucil supplements which have not been helping. Advised pt to start taking miralax  BID with stool softeners to see if that will help provide relief. Instructed to continue miralax  daily after bowel movement produced while she is receiving chemo. Instructed to call back if symptoms do not resolve or worsen. Pt verbalized understanding.

## 2023-09-19 ENCOUNTER — Ambulatory Visit
Admission: RE | Admit: 2023-09-19 | Discharge: 2023-09-19 | Disposition: A | Discharge status: Home / Self Care | Source: Ambulatory Visit | Attending: Radiation Oncology | Admitting: Radiation Oncology

## 2023-09-19 ENCOUNTER — Other Ambulatory Visit: Payer: Self-pay

## 2023-09-19 DIAGNOSIS — Z5111 Encounter for antineoplastic chemotherapy: Secondary | ICD-10-CM | POA: Diagnosis not present

## 2023-09-19 LAB — RAD ONC ARIA SESSION SUMMARY
Course Elapsed Days: 10
Plan Fractions Treated to Date: 9
Plan Prescribed Dose Per Fraction: 2 Gy
Plan Total Fractions Prescribed: 33
Plan Total Prescribed Dose: 66 Gy
Reference Point Dosage Given to Date: 18 Gy
Reference Point Session Dosage Given: 2 Gy
Session Number: 9

## 2023-09-20 ENCOUNTER — Other Ambulatory Visit: Payer: Self-pay

## 2023-09-20 ENCOUNTER — Ambulatory Visit
Admission: RE | Admit: 2023-09-20 | Discharge: 2023-09-20 | Disposition: A | Discharge status: Home / Self Care | Source: Ambulatory Visit | Attending: Radiation Oncology | Admitting: Radiation Oncology

## 2023-09-20 ENCOUNTER — Other Ambulatory Visit: Payer: Self-pay | Admitting: *Deleted

## 2023-09-20 DIAGNOSIS — Z5111 Encounter for antineoplastic chemotherapy: Secondary | ICD-10-CM | POA: Diagnosis not present

## 2023-09-20 LAB — RAD ONC ARIA SESSION SUMMARY
Course Elapsed Days: 11
Plan Fractions Treated to Date: 10
Plan Prescribed Dose Per Fraction: 2 Gy
Plan Total Fractions Prescribed: 33
Plan Total Prescribed Dose: 66 Gy
Reference Point Dosage Given to Date: 20 Gy
Reference Point Session Dosage Given: 2 Gy
Session Number: 10

## 2023-09-20 MED ORDER — SUCRALFATE 1 G PO TABS: 1.0000 g | ORAL_TABLET | Freq: Three times a day (TID) | ORAL | 1 refills | Status: DC

## 2023-09-23 ENCOUNTER — Ambulatory Visit (INDEPENDENT_AMBULATORY_CARE_PROVIDER_SITE_OTHER): Admitting: Student in an Organized Health Care Education/Training Program

## 2023-09-23 ENCOUNTER — Encounter: Payer: Self-pay | Admitting: Oncology

## 2023-09-23 ENCOUNTER — Encounter: Payer: Self-pay | Admitting: *Deleted

## 2023-09-23 ENCOUNTER — Other Ambulatory Visit: Payer: Self-pay

## 2023-09-23 ENCOUNTER — Emergency Department

## 2023-09-23 ENCOUNTER — Observation Stay
Admission: EM | Admit: 2023-09-23 | Discharge: 2023-09-24 | Disposition: A | Discharge status: Home / Self Care | Attending: Internal Medicine | Admitting: Internal Medicine

## 2023-09-23 ENCOUNTER — Encounter: Payer: Self-pay | Admitting: Student in an Organized Health Care Education/Training Program

## 2023-09-23 ENCOUNTER — Inpatient Hospital Stay (HOSPITAL_BASED_OUTPATIENT_CLINIC_OR_DEPARTMENT_OTHER): Admitting: Oncology

## 2023-09-23 ENCOUNTER — Ambulatory Visit
Admission: RE | Admit: 2023-09-23 | Discharge: 2023-09-23 | Disposition: A | Discharge status: Home / Self Care | Source: Ambulatory Visit | Attending: Radiation Oncology | Admitting: Radiation Oncology

## 2023-09-23 ENCOUNTER — Inpatient Hospital Stay

## 2023-09-23 VITALS — BP 110/70 | HR 124 | Temp 97.1°F | Ht 64.0 in | Wt 164.0 lb

## 2023-09-23 VITALS — BP 127/77 | HR 107 | Resp 18

## 2023-09-23 VITALS — BP 131/76 | HR 98 | Temp 97.8°F | Resp 16 | Wt 168.0 lb

## 2023-09-23 DIAGNOSIS — R042 Hemoptysis: Secondary | ICD-10-CM

## 2023-09-23 DIAGNOSIS — R42 Dizziness and giddiness: Secondary | ICD-10-CM | POA: Insufficient documentation

## 2023-09-23 DIAGNOSIS — L409 Psoriasis, unspecified: Secondary | ICD-10-CM | POA: Insufficient documentation

## 2023-09-23 DIAGNOSIS — Z79899 Other long term (current) drug therapy: Secondary | ICD-10-CM | POA: Diagnosis not present

## 2023-09-23 DIAGNOSIS — Z8616 Personal history of COVID-19: Secondary | ICD-10-CM | POA: Insufficient documentation

## 2023-09-23 DIAGNOSIS — C3491 Malignant neoplasm of unspecified part of right bronchus or lung: Secondary | ICD-10-CM

## 2023-09-23 DIAGNOSIS — Z7901 Long term (current) use of anticoagulants: Secondary | ICD-10-CM | POA: Insufficient documentation

## 2023-09-23 DIAGNOSIS — C3411 Malignant neoplasm of upper lobe, right bronchus or lung: Secondary | ICD-10-CM | POA: Diagnosis not present

## 2023-09-23 DIAGNOSIS — I2693 Single subsegmental pulmonary embolism without acute cor pulmonale: Secondary | ICD-10-CM

## 2023-09-23 DIAGNOSIS — Z5111 Encounter for antineoplastic chemotherapy: Secondary | ICD-10-CM | POA: Diagnosis not present

## 2023-09-23 DIAGNOSIS — I10 Essential (primary) hypertension: Secondary | ICD-10-CM | POA: Diagnosis not present

## 2023-09-23 DIAGNOSIS — I2699 Other pulmonary embolism without acute cor pulmonale: Principal | ICD-10-CM | POA: Diagnosis present

## 2023-09-23 DIAGNOSIS — R053 Chronic cough: Secondary | ICD-10-CM

## 2023-09-23 DIAGNOSIS — J189 Pneumonia, unspecified organism: Secondary | ICD-10-CM | POA: Diagnosis not present

## 2023-09-23 DIAGNOSIS — R918 Other nonspecific abnormal finding of lung field: Secondary | ICD-10-CM

## 2023-09-23 DIAGNOSIS — Z87891 Personal history of nicotine dependence: Secondary | ICD-10-CM | POA: Diagnosis not present

## 2023-09-23 LAB — CBC WITH DIFFERENTIAL (CANCER CENTER ONLY)
Abs Immature Granulocytes: 0.02 10*3/uL (ref 0.00–0.07)
Basophils Absolute: 0 10*3/uL (ref 0.0–0.1)
Basophils Relative: 1 %
Eosinophils Absolute: 0.1 10*3/uL (ref 0.0–0.5)
Eosinophils Relative: 3 %
HCT: 35.9 % — ABNORMAL LOW (ref 36.0–46.0)
Hemoglobin: 12 g/dL (ref 12.0–15.0)
Immature Granulocytes: 1 %
Lymphocytes Relative: 14 %
Lymphs Abs: 0.6 10*3/uL — ABNORMAL LOW (ref 0.7–4.0)
MCH: 27.8 pg (ref 26.0–34.0)
MCHC: 33.4 g/dL (ref 30.0–36.0)
MCV: 83.1 fL (ref 80.0–100.0)
Monocytes Absolute: 0.4 10*3/uL (ref 0.1–1.0)
Monocytes Relative: 10 %
Neutro Abs: 3.1 10*3/uL (ref 1.7–7.7)
Neutrophils Relative %: 71 %
Platelet Count: 151 10*3/uL (ref 150–400)
RBC: 4.32 MIL/uL (ref 3.87–5.11)
RDW: 12.8 % (ref 11.5–15.5)
WBC Count: 4.3 10*3/uL (ref 4.0–10.5)
nRBC: 0 % (ref 0.0–0.2)

## 2023-09-23 LAB — BASIC METABOLIC PANEL WITH GFR
Anion gap: 12 (ref 5–15)
BUN: 16 mg/dL (ref 8–23)
CO2: 19 mmol/L — ABNORMAL LOW (ref 22–32)
Calcium: 9.3 mg/dL (ref 8.9–10.3)
Chloride: 106 mmol/L (ref 98–111)
Creatinine, Ser: 0.7 mg/dL (ref 0.44–1.00)
GFR, Estimated: >60 mL/min (ref >60)
Glucose, Bld: 155 mg/dL — ABNORMAL HIGH (ref 70–99)
Potassium: 4.1 mmol/L (ref 3.5–5.1)
Sodium: 137 mmol/L (ref 135–145)

## 2023-09-23 LAB — CBC WITH DIFFERENTIAL/PLATELET
Abs Immature Granulocytes: 0.02 10*3/uL (ref 0.00–0.07)
Basophils Absolute: 0 10*3/uL (ref 0.0–0.1)
Basophils Relative: 1 %
Eosinophils Absolute: 0 10*3/uL (ref 0.0–0.5)
Eosinophils Relative: 0 %
HCT: 39 % (ref 36.0–46.0)
Hemoglobin: 13.1 g/dL (ref 12.0–15.0)
Immature Granulocytes: 1 %
Lymphocytes Relative: 6 %
Lymphs Abs: 0.2 10*3/uL — ABNORMAL LOW (ref 0.7–4.0)
MCH: 28.1 pg (ref 26.0–34.0)
MCHC: 33.6 g/dL (ref 30.0–36.0)
MCV: 83.5 fL (ref 80.0–100.0)
Monocytes Absolute: 0.1 10*3/uL (ref 0.1–1.0)
Monocytes Relative: 1 %
Neutro Abs: 3.5 10*3/uL (ref 1.7–7.7)
Neutrophils Relative %: 91 %
Platelets: 273 10*3/uL (ref 150–400)
RBC: 4.67 MIL/uL (ref 3.87–5.11)
RDW: 12.9 % (ref 11.5–15.5)
WBC: 3.8 10*3/uL — ABNORMAL LOW (ref 4.0–10.5)
nRBC: 0 % (ref 0.0–0.2)

## 2023-09-23 LAB — RAD ONC ARIA SESSION SUMMARY
Course Elapsed Days: 14
Plan Fractions Treated to Date: 11
Plan Prescribed Dose Per Fraction: 2 Gy
Plan Total Fractions Prescribed: 33
Plan Total Prescribed Dose: 66 Gy
Reference Point Dosage Given to Date: 22 Gy
Reference Point Session Dosage Given: 2 Gy
Session Number: 11

## 2023-09-23 LAB — CMP (CANCER CENTER ONLY)
ALT: 29 U/L (ref 0–44)
AST: 23 U/L (ref 15–41)
Albumin: 4 g/dL (ref 3.5–5.0)
Alkaline Phosphatase: 65 U/L (ref 38–126)
Anion gap: 7 (ref 5–15)
BUN: 16 mg/dL (ref 8–23)
CO2: 23 mmol/L (ref 22–32)
Calcium: 9.1 mg/dL (ref 8.9–10.3)
Chloride: 104 mmol/L (ref 98–111)
Creatinine: 0.74 mg/dL (ref 0.44–1.00)
GFR, Estimated: >60 mL/min (ref >60)
Glucose, Bld: 100 mg/dL — ABNORMAL HIGH (ref 70–99)
Potassium: 3.8 mmol/L (ref 3.5–5.1)
Sodium: 133 mmol/L — ABNORMAL LOW (ref 135–145)
Total Bilirubin: 0.6 mg/dL (ref 0.0–1.2)
Total Protein: 6.7 g/dL (ref 6.5–8.1)

## 2023-09-23 LAB — TROPONIN I (HIGH SENSITIVITY)
Troponin I (High Sensitivity): 3 ng/L (ref <18)
Troponin I (High Sensitivity): 3 ng/L (ref <18)

## 2023-09-23 LAB — PROTIME-INR
INR: 0.9 (ref 0.8–1.2)
Prothrombin Time: 12.5 s (ref 11.4–15.2)

## 2023-09-23 LAB — APTT: aPTT: >200 s (ref 24–36)

## 2023-09-23 MED ORDER — BENZONATATE 100 MG PO CAPS: 100.0000 mg | ORAL_CAPSULE | Freq: Two times a day (BID) | ORAL | Status: DC | PRN

## 2023-09-23 MED ORDER — SODIUM CHLORIDE 0.9 % IV SOLN
INTRAVENOUS | Status: DC
  Filled 2023-09-23: qty 250

## 2023-09-23 MED ORDER — ONDANSETRON HCL 4 MG PO TABS
4.0000 mg | ORAL_TABLET | Freq: Four times a day (QID) | ORAL | Status: DC | PRN
Start: 2023-09-23 — End: 2023-09-24

## 2023-09-23 MED ORDER — SODIUM CHLORIDE 0.9 % IV SOLN
500.0000 mg | INTRAVENOUS | Status: DC
  Administered 2023-09-23: 500 mg via INTRAVENOUS
  Filled 2023-09-23: qty 5

## 2023-09-23 MED ORDER — ACETAMINOPHEN 650 MG RE SUPP: 650.0000 mg | Freq: Four times a day (QID) | RECTAL | Status: DC | PRN

## 2023-09-23 MED ORDER — ONDANSETRON HCL 4 MG/2ML IJ SOLN: 4.0000 mg | Freq: Four times a day (QID) | INTRAMUSCULAR | Status: DC | PRN

## 2023-09-23 MED ORDER — SODIUM CHLORIDE 0.9 % IV SOLN
1.0000 g | INTRAVENOUS | Status: DC
  Filled 2023-09-23: qty 10

## 2023-09-23 MED ORDER — SODIUM CHLORIDE 0.9 % IV SOLN
45.0000 mg/m2 | Freq: Once | INTRAVENOUS | Status: AC
  Administered 2023-09-23: 84 mg via INTRAVENOUS
  Filled 2023-09-23: qty 14

## 2023-09-23 MED ORDER — PALONOSETRON HCL INJECTION 0.25 MG/5ML
0.2500 mg | Freq: Once | INTRAVENOUS | Status: AC
  Administered 2023-09-23: 0.25 mg via INTRAVENOUS
  Filled 2023-09-23: qty 5

## 2023-09-23 MED ORDER — HEPARIN BOLUS VIA INFUSION
5000.0000 [IU] | Freq: Once | INTRAVENOUS | Status: AC
  Administered 2023-09-23: 5000 [IU] via INTRAVENOUS
  Filled 2023-09-23: qty 5000

## 2023-09-23 MED ORDER — POLYETHYLENE GLYCOL 3350 17 G PO PACK: 17.0000 g | PACK | Freq: Every day | ORAL | Status: DC | PRN

## 2023-09-23 MED ORDER — FAMOTIDINE IN NACL 20-0.9 MG/50ML-% IV SOLN
20.0000 mg | Freq: Once | INTRAVENOUS | Status: AC
  Administered 2023-09-23: 20 mg via INTRAVENOUS
  Filled 2023-09-23: qty 50

## 2023-09-23 MED ORDER — DEXAMETHASONE SODIUM PHOSPHATE 10 MG/ML IJ SOLN
10.0000 mg | Freq: Once | INTRAMUSCULAR | Status: AC
  Administered 2023-09-23: 10 mg via INTRAVENOUS
  Filled 2023-09-23: qty 1

## 2023-09-23 MED ORDER — SODIUM CHLORIDE 0.9 % IV SOLN
1.0000 g | Freq: Once | INTRAVENOUS | Status: AC
  Administered 2023-09-23: 1 g via INTRAVENOUS
  Filled 2023-09-23: qty 10

## 2023-09-23 MED ORDER — TRANEXAMIC ACID FOR INHALATION
500.0000 mg | Freq: Once | RESPIRATORY_TRACT | Status: AC
  Administered 2023-09-23: 500 mg via RESPIRATORY_TRACT
  Filled 2023-09-23: qty 10

## 2023-09-23 MED ORDER — ACETAMINOPHEN 325 MG PO TABS: 650.0000 mg | ORAL_TABLET | Freq: Four times a day (QID) | ORAL | Status: DC | PRN

## 2023-09-23 MED ORDER — HEPARIN (PORCINE) 25000 UT/250ML-% IV SOLN
1100.0000 [IU]/h | INTRAVENOUS | Status: DC
  Administered 2023-09-23: 1200 [IU]/h via INTRAVENOUS
  Filled 2023-09-23: qty 250

## 2023-09-23 MED ORDER — DIPHENHYDRAMINE HCL 50 MG/ML IJ SOLN
50.0000 mg | Freq: Once | INTRAMUSCULAR | Status: AC
  Administered 2023-09-23: 50 mg via INTRAVENOUS
  Filled 2023-09-23: qty 1

## 2023-09-23 MED ORDER — HEPARIN SOD (PORK) LOCK FLUSH 100 UNIT/ML IV SOLN
500.0000 [IU] | Freq: Once | INTRAVENOUS | Status: AC | PRN
  Administered 2023-09-23: 500 [IU]
  Filled 2023-09-23: qty 5

## 2023-09-23 MED ORDER — ALBUTEROL SULFATE (2.5 MG/3ML) 0.083% IN NEBU: 2.5000 mg | INHALATION_SOLUTION | Freq: Four times a day (QID) | RESPIRATORY_TRACT | Status: DC | PRN

## 2023-09-23 MED ORDER — IOHEXOL 350 MG/ML SOLN
75.0000 mL | Freq: Once | INTRAVENOUS | Status: AC | PRN
  Administered 2023-09-23: 75 mL via INTRAVENOUS

## 2023-09-23 MED ORDER — LOSARTAN POTASSIUM 25 MG PO TABS
12.5000 mg | ORAL_TABLET | Freq: Every day | ORAL | Status: DC
  Administered 2023-09-24: 12.5 mg via ORAL
  Filled 2023-09-23: qty 0.5

## 2023-09-23 MED ORDER — ATORVASTATIN CALCIUM 10 MG PO TABS
10.0000 mg | ORAL_TABLET | Freq: Every day | ORAL | Status: DC
  Administered 2023-09-23: 10 mg via ORAL
  Filled 2023-09-23: qty 1

## 2023-09-23 MED ORDER — SODIUM CHLORIDE 0.9% FLUSH
10.0000 mL | INTRAVENOUS | Status: DC | PRN
  Administered 2023-09-23: 10 mL
  Filled 2023-09-23: qty 10

## 2023-09-23 MED ORDER — SODIUM CHLORIDE 0.9 % IV SOLN
231.6000 mg | Freq: Once | INTRAVENOUS | Status: AC
  Administered 2023-09-23: 230 mg via INTRAVENOUS
  Filled 2023-09-23: qty 23

## 2023-09-23 MED ORDER — SODIUM CHLORIDE 0.9 % IV SOLN
500.0000 mg | Freq: Once | INTRAVENOUS | Status: DC
  Filled 2023-09-23: qty 5

## 2023-09-23 NOTE — Assessment & Plan Note (Signed)
 She has tried over the counter cough medication with no relief.  Guaifenesin  Codeine  cough suppressant PRN

## 2023-09-23 NOTE — Assessment & Plan Note (Signed)
 Chemotherapy treatment as planned above

## 2023-09-23 NOTE — Progress Notes (Signed)
 Assessment & Plan:   1. Lung mass (Primary) 2. 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  It's most likely that the hemoptysis is originating from the necrotic bronchus intermedius mass.  What worries me is the amount of bright red blood that she has experienced overnight.  While this will most likely be self-limiting, there is the chance that this is a harbinger of more hemoptysis to come. I am most concerned about the potential for asphyxiation and airway obstruction should the patient go home (lives 20 minutes from the hospital).  Given this, I discussed with the patient that it is prudent to have her admitted to the hospital for 24 hours for observation.  We would also be able to obtain a CTA of the chest to assess for active bleeding as well as initiate nebulized TXA while she is hospitalized.  -admit to hospital, initiate nebulized TXA inpatient -CTA chest -follow up with Dr. Lucina Sabal after discharge   I spent 30 minutes caring for this patient today, including preparing to see the patient, obtaining a medical history , reviewing a separately obtained history, performing a medically appropriate examination and/or evaluation, counseling and educating the patient/family/caregiver, ordering medications, tests, or procedures, documenting clinical information in the electronic health record, and independently interpreting results (not separately reported/billed) and communicating results to the patient/family/caregiver  Vergia Glasgow, MD [facility] 09/23/2023 3:36 PM    End of visit medications:  No orders of the defined types were placed in this encounter.    Current Outpatient Medications:    atorvastatin  (LIPITOR) 10 MG tablet, TAKE 1 TABLET BY MOUTH AT BEDTIME, Disp: 90 tablet, Rfl: 1   benzonatate  (TESSALON   PERLES) 100 MG capsule, Take 1 capsule (100 mg total) by mouth 2 (two) times daily as needed for cough., Disp: 30 capsule, Rfl: 1   dexamethasone  (DECADRON ) 4 MG tablet, Take 2 tablets daily for 2 days, start the day after chemotherapy. Take with food., Disp: 30 tablet, Rfl: 1   guaifenesin  (ROBITUSSIN) 100 MG/5ML syrup, Take 200 mg by mouth 3 (three) times daily as needed for cough., Disp: , Rfl:    guaiFENesin -codeine  100-10 MG/5ML syrup, Take 5 mLs by mouth every 6 (six) hours as needed for cough., Disp: 118 mL, Rfl: 0   levalbuterol  (XOPENEX  HFA) 45 MCG/ACT inhaler, Inhale 2 puffs into the lungs every 6 (six) hours as needed for wheezing., Disp: 15 g, Rfl: 3   lidocaine -prilocaine  (EMLA ) cream, Apply to affected area once, Disp: 30 g, Rfl: 3   losartan  (COZAAR ) 25 MG tablet, Take 0.5 tablets (12.5 mg total) by mouth daily., Disp: 45 tablet, Rfl: 1   meclizine  (ANTIVERT ) 25 MG tablet, Take 1 tablet (25 mg total) by mouth 3 (three) times daily as needed for dizziness., Disp: 30 tablet, Rfl: 3   ondansetron  (ZOFRAN ) 8 MG tablet, Take 1 tablet (8 mg total) by mouth every 8 (eight) hours as needed for nausea or vomiting. Start on the third day after chemotherapy., Disp: 30 tablet, Rfl: 1   prochlorperazine  (COMPAZINE ) 10 MG tablet, Take 1 tablet (10 mg total) by mouth every 6 (six) hours as needed for nausea or vomiting., Disp: 30 tablet, Rfl: 1   sucralfate (CARAFATE) 1 g tablet, Take 1 tablet (1 g total) by mouth 3 (three) times daily before meals., Disp: 90 tablet, Rfl: 1   triamcinolone  ointment (KENALOG ) 0.5 %, Apply  1 Application topically 2 (two) times daily., Disp: 30 g, Rfl: 6   Subjective:   PATIENT ID: Tanya Howell GENDER: female DOB: [birth year], MRN: [id number]  Chief Complaint  Patient presents with   Follow-up    Hemoptysis last night and this morning. Little wheezing. Occasional SOB.    HPI  Patient is a pleasant 63 year old female with a past medical history of stage III  lung adenocarcinoma presenting to clinic for the evaluation of hemoptysis.  Patient was previously seen by my colleague Dr. Lucina Sabal in clinic on 08/07/2023 for the evaluation of a lung mass.  A CT scan of the chest had shown a 3.5 cm right hilar mass in the bronchus intermedius suspicious for malignancy.  At that time, she had also experienced a cough as well as 1 episode of hemoptysis.  She underwent bronchoscopy on 08/08/2023 which showed an endobronchial mass involving the bronchus intermedius and obstructing the right middle lobe and right lower lobe.  This was biopsied with pathology showing adenocarcinoma.  Furthermore, EBUS to station 7 was also positive for adenocarcinoma.  She has been seen by Dr. Wilhelmenia Harada and Dr. Maida Sciara and has been undergoing chemoradiation for stage III NSCLCa.  Today, she presents with hemoptysis.  She reports coughing up multiple teaspoons of bright red blood yesterday, the equivalent of a third of a cup.  She also coughed up bright red blood once this morning (1 teaspoon).  She does not have any shortness of breath, chest pain, wheezing, fevers, chills, or night sweats.  She was seen by her oncologist this morning and underwent another session of radiation.   Ancillary information including prior medications, full medical/surgical/family/social histories, and PFTs (when available) are listed below and have been reviewed.   Review of Systems  Constitutional:  Negative for chills, fever and weight loss.  Respiratory:  Positive for cough and hemoptysis. Negative for shortness of breath and wheezing.   Cardiovascular:  Negative for chest pain.     Objective:   Vitals:   09/23/23 1438  BP: 110/70  Pulse: (!) 124  Temp: (!) 97.1 F (36.2 C)  SpO2: 95%  Weight: 164 lb (74.4 kg)  Height: 5' 4 (1.626 m)   95% on RA BMI Readings from Last 3 Encounters:  09/23/23 28.15 kg/m  09/23/23 28.84 kg/m  09/16/23 28.84 kg/m   Wt Readings from Last 3 Encounters:  09/23/23  164 lb (74.4 kg)  09/23/23 168 lb (76.2 kg)  09/16/23 168 lb (76.2 kg)    Physical Exam Constitutional:      Appearance: Normal appearance.   Cardiovascular:     Rate and Rhythm: Regular rhythm. Tachycardia present.     Pulses: Normal pulses.     Heart sounds: Normal heart sounds.  Pulmonary:     Effort: Pulmonary effort is normal.     Breath sounds: Rales (right lower lung field) present.   Musculoskeletal:     Right lower leg: No edema.     Left lower leg: No edema.   Neurological:     General: No focal deficit present.     Mental Status: She is alert and oriented to person, place, and time. Mental status is at baseline.       Ancillary Information    Past Medical History:  Diagnosis Date   COVID-19 07/2018   Hyperlipidemia    Hypertension    Motion sickness    amuesment park rides   Osteopenia 11/01/2015   Pneumonia    april 2025  Psoriasis    Vertigo    none for over 5 yrs   Wears dentures    full upper and lower     Family History  Problem Relation Age of Onset   Colon cancer Brother        61    Breast cancer Mother    Breast cancer Maternal Aunt    Pneumonia Father    Diabetes Maternal Grandmother      Past Surgical History:  Procedure Laterality Date   BRONCHIAL BIOPSY  08/08/2023   Procedure: BRONCHOSCOPY, WITH BIOPSY;  Surgeon: Annitta Kindler, MD;  Location: [facility];  Service: Pulmonary;;   BRONCHIAL BRUSHINGS  08/08/2023   Procedure: BRONCHOSCOPY, WITH BRUSH BIOPSY;  Surgeon: Annitta Kindler, MD;  Location: [facility];  Service: Pulmonary;;   BRONCHIAL NEEDLE ASPIRATION BIOPSY  08/08/2023   Procedure: BRONCHOSCOPY, WITH NEEDLE ASPIRATION BIOPSY;  Surgeon: Annitta Kindler, MD;  Location: [facility];  Service: Pulmonary;;   CHOLECYSTECTOMY  1990   COLONOSCOPY WITH PROPOFOL  N/A 07/15/2020   Procedure: COLONOSCOPY WITH PROPOFOL ;  Surgeon: Marnee Sink, MD;  Location: [facility];  Service: Endoscopy;  Laterality:  N/A;  priority 4   ENDOBRONCHIAL ULTRASOUND Bilateral 08/08/2023   Procedure: ENDOBRONCHIAL ULTRASOUND (EBUS);  Surgeon: Annitta Kindler, MD;  Location: [facility];  Service: Pulmonary;  Laterality: Bilateral;   IR IMAGING GUIDED PORT INSERTION  08/29/2023   TUBAL LIGATION      Social History   Socioeconomic History   Marital status: Married    Spouse name: Lavonia Powers   Number of children: 1   Years of education: Not on file   Highest education level: High school graduate  Occupational History   Not on file  Tobacco Use   Smoking status: Former    Current packs/day: 0.00    Average packs/day: 1 pack/day for 28.0 years (28.0 ttl pk-yrs)    Types: Cigarettes    Start date: 69    Quit date: 2011    Years since quitting: 14.4   Smokeless tobacco: Never   Tobacco comments:    Started smoking around 63 yrs old.    Smoked 1PPD at her heaviest.    Quit smoking in 2011- [name] 08/07/2023  Vaping Use   Vaping status: Former  Substance and Sexual Activity   Alcohol use: No    Alcohol/week: 0.0 standard drinks of alcohol    Comment: rare   Drug use: No   Sexual activity: Yes    Birth control/protection: Surgical  Other Topics Concern   Not on file  Social History Narrative   Not on file   Social Drivers of Health   Financial Resource Strain: Low Risk  (05/06/2018)   Overall Financial Resource Strain (CARDIA)    Difficulty of Paying Living Expenses: Not hard at all  Food Insecurity: No Food Insecurity (05/06/2018)   Hunger Vital Sign    Worried About Running Out of Food in the Last Year: Never true    Ran Out of Food in the Last Year: Never true  Transportation Needs: No Transportation Needs (11/10/2019)   PRAPARE - Administrator, Civil Service (Medical): No    Lack of Transportation (Non-Medical): No  Physical Activity: Sufficiently Active (05/06/2018)   Exercise Vital Sign    Days of Exercise per Week: 7 days    Minutes of Exercise per Session: 30 min  Stress: No  Stress Concern Present (05/06/2018)   [employer] of Occupational Health - Occupational Stress Questionnaire    Feeling  of Stress : Not at all  Social Connections: Somewhat Isolated (05/06/2018)   Social Connection and Isolation Panel    Frequency of Communication with Friends and Family: More than three times a week    Frequency of Social Gatherings with Friends and Family: Twice a week    Attends Religious Services: Never    Database administrator or Organizations: No    Attends Banker Meetings: Never    Marital Status: Married  Catering manager Violence: Not At Risk (11/10/2019)   Humiliation, Afraid, Rape, and Kick questionnaire    Fear of Current or Ex-Partner: No    Emotionally Abused: No    Physically Abused: No    Sexually Abused: No     Allergies  Allergen Reactions   Lisinopril  Cough     CBC    Component Value Date/Time   WBC 4.3 09/23/2023 0904   WBC 10.6 (H) 08/03/2023 1426   RBC 4.32 09/23/2023 0904   HGB 12.0 09/23/2023 0904   HGB 13.6 11/13/2022 1553   HCT 35.9 (L) 09/23/2023 0904   HCT 41.2 11/13/2022 1553   PLT 151 09/23/2023 0904   PLT 225 11/13/2022 1553   MCV 83.1 09/23/2023 0904   MCV 85 11/13/2022 1553   MCH 27.8 09/23/2023 0904   MCHC 33.4 09/23/2023 0904   RDW 12.8 09/23/2023 0904   RDW 13.0 11/13/2022 1553   LYMPHSABS 0.6 (L) 09/23/2023 0904   LYMPHSABS 1.7 11/13/2022 1553   MONOABS 0.4 09/23/2023 0904   EOSABS 0.1 09/23/2023 0904   EOSABS 0.1 11/13/2022 1553   BASOSABS 0.0 09/23/2023 0904   BASOSABS 0.1 11/13/2022 1553    Pulmonary Functions Testing Results:     No data to display          Outpatient Medications Prior to Visit  Medication Sig Dispense Refill   atorvastatin  (LIPITOR) 10 MG tablet TAKE 1 TABLET BY MOUTH AT BEDTIME 90 tablet 1   benzonatate  (TESSALON  PERLES) 100 MG capsule Take 1 capsule (100 mg total) by mouth 2 (two) times daily as needed for cough. 30 capsule 1   dexamethasone  (DECADRON ) 4 MG  tablet Take 2 tablets daily for 2 days, start the day after chemotherapy. Take with food. 30 tablet 1   guaifenesin  (ROBITUSSIN) 100 MG/5ML syrup Take 200 mg by mouth 3 (three) times daily as needed for cough.     guaiFENesin -codeine  100-10 MG/5ML syrup Take 5 mLs by mouth every 6 (six) hours as needed for cough. 118 mL 0   levalbuterol  (XOPENEX  HFA) 45 MCG/ACT inhaler Inhale 2 puffs into the lungs every 6 (six) hours as needed for wheezing. 15 g 3   lidocaine -prilocaine  (EMLA ) cream Apply to affected area once 30 g 3   losartan  (COZAAR ) 25 MG tablet Take 0.5 tablets (12.5 mg total) by mouth daily. 45 tablet 1   meclizine  (ANTIVERT ) 25 MG tablet Take 1 tablet (25 mg total) by mouth 3 (three) times daily as needed for dizziness. 30 tablet 3   ondansetron  (ZOFRAN ) 8 MG tablet Take 1 tablet (8 mg total) by mouth every 8 (eight) hours as needed for nausea or vomiting. Start on the third day after chemotherapy. 30 tablet 1   prochlorperazine  (COMPAZINE ) 10 MG tablet Take 1 tablet (10 mg total) by mouth every 6 (six) hours as needed for nausea or vomiting. 30 tablet 1   sucralfate (CARAFATE) 1 g tablet Take 1 tablet (1 g total) by mouth 3 (three) times daily before meals. 90 tablet 1  triamcinolone  ointment (KENALOG ) 0.5 % Apply 1 Application topically 2 (two) times daily. 30 g 6   0.9 %  sodium chloride  infusion      sodium chloride  flush (NS) 0.9 % injection 10 mL      No facility-administered medications prior to visit.

## 2023-09-23 NOTE — ED Triage Notes (Signed)
 Pt arrives cia POV with c/o coughing up blood that started last night. Pt estimates it was about 8 tsp or 1/3 cup of blood. Pt is a cancer pt undergoing chemo for lung cancer and had a treatment this morning. Oncologist and pulmonologist are concerned about a bleeder and want pt to get scanned. Pt is A&Ox4 and ambulatory in triage.

## 2023-09-23 NOTE — Assessment & Plan Note (Signed)
 Recommend calcium  supplementation 600mg  daily

## 2023-09-23 NOTE — ED Provider Notes (Addendum)
 [facility] Provider Note    Event Date/Time   First MD Initiated Contact with Patient 09/23/23 1909     (approximate)   History   Chief Complaint coughing up blood   HPI  Tanya Howell is a 63 y.o. female with past medical history of hypertension, hyperlipidemia, and lung cancer presents to the ED complaining of hemoptysis.  Patient reports that last night she coughed up 8-10 quarter sized clots, but did not notice significant bleeding afterwards.  After getting up this morning, she coughed up a couple of additional clots, but again did not notice bleeding afterwards and presented for her scheduled chemotherapy.  She had follow-up scheduled with her pulmonologist, who recommended she come to the ED for admission for observation.  She denies any fevers, chest pain, or shortness of breath.  She has not noticed any pain or swelling in her legs.      Physical Exam   Triage Vital Signs: ED Triage Vitals  Encounter Vitals Group     BP 09/23/23 1633 111/80     Girls Systolic BP Percentile --      Girls Diastolic BP Percentile --      Boys Systolic BP Percentile --      Boys Diastolic BP Percentile --      Pulse Rate 09/23/23 1633 (!) 103     Resp 09/23/23 1633 16     Temp 09/23/23 1633 98.1 F (36.7 C)     Temp Source 09/23/23 1633 Oral     SpO2 09/23/23 1633 96 %     Weight 09/23/23 1634 168 lb (76.2 kg)     Height 09/23/23 1634 5' 4 (1.626 m)     Head Circumference --      Peak Flow --      Pain Score 09/23/23 1632 0     Pain Loc --      Pain Education --      Exclude from Growth Chart --     Most recent vital signs: Vitals:   09/23/23 1633  BP: 111/80  Pulse: (!) 103  Resp: 16  Temp: 98.1 F (36.7 C)  SpO2: 96%    Constitutional: Alert and oriented. Eyes: Conjunctivae are normal. Head: Atraumatic. Nose: No congestion/rhinnorhea. Mouth/Throat: Mucous membranes are moist.  Cardiovascular: Normal rate, regular rhythm. Grossly  normal heart sounds.  2+ radial pulses bilaterally. Respiratory: Normal respiratory effort.  No retractions. Lungs CTAB. Gastrointestinal: Soft and nontender. No distention. Musculoskeletal: No lower extremity tenderness nor edema.  Neurologic:  Normal speech and language. No gross focal neurologic deficits are appreciated.    ED Results / Procedures / Treatments   Labs (all labs ordered are listed, but only abnormal results are displayed) Labs Reviewed  BASIC METABOLIC PANEL WITH GFR - Abnormal; Notable for the following components:      Result Value   CO2 19 (*)    Glucose, Bld 155 (*)    All other components within normal limits  CBC WITH DIFFERENTIAL/PLATELET - Abnormal; Notable for the following components:   WBC 3.8 (*)    Lymphs Abs 0.2 (*)    All other components within normal limits  PROTIME-INR  APTT  TROPONIN I (HIGH SENSITIVITY)  TROPONIN I (HIGH SENSITIVITY)     EKG  ED ECG REPORT I, Twilla Galea, the attending physician, personally viewed and interpreted this ECG.   Date: 09/23/2023  EKG Time: 16:56  Rate: 105  Rhythm: sinus tachycardia  Axis: Normal  Intervals:none  ST&T Change: None  RADIOLOGY CTA chest reviewed and interpreted by me with pulmonary embolism noted.  PROCEDURES:  Critical Care performed: Yes, see critical care procedure note(s)  .Critical Care  Performed by: Twilla Galea, MD Authorized by: Twilla Galea, MD   Critical care provider statement:    Critical care time (minutes):  30   Critical care time was exclusive of:  Separately billable procedures and treating other patients and teaching time   Critical care was necessary to treat or prevent imminent or life-threatening deterioration of the following conditions: Pulmonary embolism.   Critical care was time spent personally by me on the following activities:  Development of treatment plan with patient or surrogate, discussions with consultants, evaluation of patient's  response to treatment, examination of patient, ordering and review of laboratory studies, ordering and review of radiographic studies, ordering and performing treatments and interventions, pulse oximetry, re-evaluation of patient's condition and review of old charts   I assumed direction of critical care for this patient from another provider in my specialty: no     Care discussed with: admitting provider      MEDICATIONS ORDERED IN ED: Medications  tranexamic acid (CYKLOKAPRON) 1000 MG/10ML nebulizer solution 500 mg (has no administration in time range)  heparin  bolus via infusion 5,000 Units (has no administration in time range)  heparin  ADULT infusion 100 units/mL (25000 units/250mL) (has no administration in time range)  cefTRIAXone (ROCEPHIN) 1 g in sodium chloride  0.9 % 100 mL IVPB (has no administration in time range)  azithromycin  (ZITHROMAX ) 500 mg in sodium chloride  0.9 % 250 mL IVPB (has no administration in time range)  iohexol  (OMNIPAQUE ) 350 MG/ML injection 75 mL (75 mLs Intravenous Contrast Given 09/23/23 1813)     IMPRESSION / MDM / ASSESSMENT AND PLAN / ED COURSE  I reviewed the triage vital signs and the nursing notes.                              63 y.o. female with past medical history of hypertension, hyperlipidemia, and lung cancer who presents to the ED complaining of hemoptysis last night and this morning.  Patient's presentation is most consistent with acute presentation with potential threat to life or bodily function.  Differential diagnosis includes, but is not limited to, pulmonary embolism, massive mopped assist, bronchitis, pneumonia, anemia.  Patient nontoxic-appearing and in no acute distress, vital signs remarkable for mild tachycardia but otherwise reassuring.  Patient with no active hemoptysis at this time, states that it has been since this morning that she coughed up blood.  EKG shows sinus tachycardia without ischemic changes, troponin within normal  limits.  CTA chest does show segmental PE with no evidence of right heart strain, additionally with evidence of postobstructive pneumonia.  Additional labs are reassuring with no significant anemia, leukocytosis, electrolyte abnormality, or AKI.  Case discussed with Dr. Meredeth Stallion of pulmonary, who agrees with plan for nebulized TXA and starting patient on IV heparin  drip.  We will treat pneumonia with IV antibiotics.  Case discussed with hospitalist for admission.      FINAL CLINICAL IMPRESSION(S) / ED DIAGNOSES   Final diagnoses:  Acute pulmonary embolism without acute cor pulmonale, unspecified pulmonary embolism type (HCC)  Hemoptysis     Rx / DC Orders   ED Discharge Orders     None        Note:  This document was prepared using Dragon voice recognition software and may include  unintentional dictation errors.   Twilla Galea, MD 09/23/23 1941    Twilla Galea, MD 09/23/23 [phone]

## 2023-09-23 NOTE — Progress Notes (Signed)
 Hematology/Oncology Progress note [phone] [phone]       CHIEF COMPLAINTS/PURPOSE OF CONSULTATION:  Stage III lung adenocarcinoam  ASSESSMENT & PLAN:   Cancer Staging  Primary lung adenocarcinoma, right [facility]) Staging form: Lung, AJCC V9 - Clinical stage from 08/15/2023: [name], cM0 - Signed by Timmy Forbes, MD on 08/15/2023   Primary lung adenocarcinoma, right [facility]) Images and pathology results were reviewed with the patient. cT2 N2 M0, Stage III right lung adenocarcinoma.  NGS showed TPS 5% CPS 10  KRAS G12C, TMB 12.6  Labs are reviewed and discussed with patient. Proceed with carboplatin  AUC 2, Taxol  45mg /m2   Cough She has tried over the counter cough medication with no relief.  Guaifenesin  Codeine  cough suppressant PRN  Encounter for antineoplastic chemotherapy Chemotherapy treatment as planned above  Hemoptysis No recurrent hemoptysis.  No anemia on recent CBC. Discussed with patient about ER triggers Avoid NSAIDs Discussed with pulmonology, she will be seen this afternoon.   Hypocalcemia Recommend calcium  supplementation 600mg  daily  No orders of the defined types were placed in this encounter.  Follow-up 1 week All questions were answered. The patient knows to call the clinic with any problems, questions or concerns.  Timmy Forbes, MD, PhD [facility] Hematology Oncology 09/23/2023    HISTORY OF PRESENTING ILLNESS:  AIMIE WAGMAN 63 y.o. female presents to establish care for lung cancer  Oncology History  Primary lung adenocarcinoma, right (HCC)  08/03/2023 Imaging   CT angiogram chest PE protocol showed  1. 3.5 x 2.6 cm right hilar mass, with obstruction of the bronchus intermedius, right middle lobe bronchus, and portions of the right lower lobe bronchus as above, highly concerning for neoplasm. Bronchoscopy is recommended for further evaluation. 2. Subcarinal adenopathy concerning for metastatic disease. 3. Dense medial  segment consolidation within the right middle lobe with associated volume loss, consistent with atelectasis. 4. Prominent right lower lobe bronchiectasis, with fluid-filled bronchi and patchy right basilar consolidation consistent with combination of airspace disease and atelectasis. 5. No evidence of pulmonary embolus. 6. Aortic Atherosclerosis (ICD10-I70.0). Coronary artery atherosclerosis.   08/06/2023 Initial Diagnosis   Primary lung adenocarcinoma, right (HCC)  08/03/2023, patient presented to emergency room for evaluation of hemoptysis. Patient had pneumonia in March 2025, she continues to have a lingering cough for the past months despite being treated with doxycycline  and prednisone . She coughed up small amount of bright red blood on 08/03/2023.  Denies any shortness of breath, chest pain unintentional weight loss headache.  She felt feverish with chills 2 nights ago. Patient is a former smoker, quit smoking in 2011. Patient denies being on any antiplatelet or anticoagulation agents.  08/08/2023 She underwent biopsy via bronchoscopy.  FINAL MICROSCOPIC DIAGNOSIS:  A. LUNG, RLL, BRUSHING:  - Adenocarcinoma   B. LUNG, RLL, FINE NEEDLE ASPIRATION:  - Adenocarcinoma   Immunohistochemical stains were performed to characterize the tumor cells. The cells are negative for TTF-1, Napsin A, p40, synaptophysin, Chromogranin A, and CD56.  Mucicarmine stain highlights intracytoplasmic mucin. Ki67 proliferation index is approximately 30%. The overall findings are supportive of the diagnosis of adenocarcinoma.   C. LYMPH NODE, 7, FINE NEEDLE ASPIRATION:  - Adenocarcinoma  - Lymphoid tissue present   D. LYMPH NODE, 4R, FINE NEEDLE ASPIRATION:  - No malignant cells identified  - Lymphoid tissue present   E. LYMPH NODE, 11R, FINE NEEDLE ASPIRATION:  - No malignant cells identified  - Lymphoid tissue present   F. LUNG, RLL, LAVAGE:  FINAL MICROSCOPIC DIAGNOSIS:  -  Atypical cells present     08/15/2023 Cancer Staging   Staging form: Lung, AJCC V9 - Clinical stage from 08/15/2023: [name], cM0 - Signed by Timmy Forbes, MD on 08/15/2023 Stage prefix: Initial diagnosis Method of lymph node assessment: Clinical   09/09/2023 -  Chemotherapy   Patient is on Treatment Plan : LUNG Carboplatin  + Paclitaxel  + XRT q7d      She has no new complaints except intermittent cough.  She has tried cough suppressant, not very effective and caused constipation. She has not used much.  Last night. She has coughed up small amount of blood.   MEDICAL HISTORY:  Past Medical History:  Diagnosis Date   COVID-19 07/2018   Hyperlipidemia    Hypertension    Motion sickness    amuesment park rides   Osteopenia 11/01/2015   Pneumonia    april 2025   Psoriasis    Vertigo    none for over 5 yrs   Wears dentures    full upper and lower    SURGICAL HISTORY: Past Surgical History:  Procedure Laterality Date   BRONCHIAL BIOPSY  08/08/2023   Procedure: BRONCHOSCOPY, WITH BIOPSY;  Surgeon: Annitta Kindler, MD;  Location: [facility] ENDOSCOPY;  Service: Pulmonary;;   BRONCHIAL BRUSHINGS  08/08/2023   Procedure: BRONCHOSCOPY, WITH BRUSH BIOPSY;  Surgeon: Annitta Kindler, MD;  Location: [facility] ENDOSCOPY;  Service: Pulmonary;;   BRONCHIAL NEEDLE ASPIRATION BIOPSY  08/08/2023   Procedure: BRONCHOSCOPY, WITH NEEDLE ASPIRATION BIOPSY;  Surgeon: Annitta Kindler, MD;  Location: [facility] ENDOSCOPY;  Service: Pulmonary;;   CHOLECYSTECTOMY  1990   COLONOSCOPY WITH PROPOFOL  N/A 07/15/2020   Procedure: COLONOSCOPY WITH PROPOFOL ;  Surgeon: Marnee Sink, MD;  Location: [facility];  Service: Endoscopy;  Laterality: N/A;  priority 4   ENDOBRONCHIAL ULTRASOUND Bilateral 08/08/2023   Procedure: ENDOBRONCHIAL ULTRASOUND (EBUS);  Surgeon: Annitta Kindler, MD;  Location: [facility];  Service: Pulmonary;  Laterality: Bilateral;   IR IMAGING GUIDED PORT INSERTION  08/29/2023   TUBAL LIGATION      SOCIAL HISTORY: Social  History   Socioeconomic History   Marital status: Married    Spouse name: Lavonia Powers   Number of children: 1   Years of education: Not on file   Highest education level: High school graduate  Occupational History   Not on file  Tobacco Use   Smoking status: Former    Current packs/day: 0.00    Average packs/day: 1 pack/day for 28.0 years (28.0 ttl pk-yrs)    Types: Cigarettes    Start date: 29    Quit date: 2011    Years since quitting: 14.4   Smokeless tobacco: Never   Tobacco comments:    Started smoking around 63 yrs old.    Smoked 1PPD at her heaviest.    Quit smoking in 2011- khj 08/07/2023  Vaping Use   Vaping status: Former  Substance and Sexual Activity   Alcohol use: No    Alcohol/week: 0.0 standard drinks of alcohol    Comment: rare   Drug use: No   Sexual activity: Yes    Birth control/protection: Surgical  Other Topics Concern   Not on file  Social History Narrative   Not on file   Social Drivers of Health   Financial Resource Strain: Low Risk  (05/06/2018)   Overall Financial Resource Strain (CARDIA)    Difficulty of Paying Living Expenses: Not hard at all  Food Insecurity: No Food Insecurity (05/06/2018)   Hunger Vital Sign    Worried About  Running Out of Food in the Last Year: Never true    Ran Out of Food in the Last Year: Never true  Transportation Needs: No Transportation Needs (11/10/2019)   PRAPARE - Administrator, Civil Service (Medical): No    Lack of Transportation (Non-Medical): No  Physical Activity: Sufficiently Active (05/06/2018)   Exercise Vital Sign    Days of Exercise per Week: 7 days    Minutes of Exercise per Session: 30 min  Stress: No Stress Concern Present (05/06/2018)   [employer] of Occupational Health - Occupational Stress Questionnaire    Feeling of Stress : Not at all  Social Connections: Somewhat Isolated (05/06/2018)   Social Connection and Isolation Panel    Frequency of Communication with Friends and  Family: More than three times a week    Frequency of Social Gatherings with Friends and Family: Twice a week    Attends Religious Services: Never    Database administrator or Organizations: No    Attends Banker Meetings: Never    Marital Status: Married  Catering manager Violence: Not At Risk (11/10/2019)   Humiliation, Afraid, Rape, and Kick questionnaire    Fear of Current or Ex-Partner: No    Emotionally Abused: No    Physically Abused: No    Sexually Abused: No    FAMILY HISTORY: Family History  Problem Relation Age of Onset   Colon cancer Brother        65    Breast cancer Mother    Breast cancer Maternal Aunt    Pneumonia Father    Diabetes Maternal Grandmother     ALLERGIES:  is allergic to lisinopril .  MEDICATIONS:  Current Outpatient Medications  Medication Sig Dispense Refill   atorvastatin  (LIPITOR) 10 MG tablet TAKE 1 TABLET BY MOUTH AT BEDTIME 90 tablet 1   benzonatate  (TESSALON  PERLES) 100 MG capsule Take 1 capsule (100 mg total) by mouth 2 (two) times daily as needed for cough. 30 capsule 1   dexamethasone  (DECADRON ) 4 MG tablet Take 2 tablets daily for 2 days, start the day after chemotherapy. Take with food. 30 tablet 1   guaifenesin  (ROBITUSSIN) 100 MG/5ML syrup Take 200 mg by mouth 3 (three) times daily as needed for cough.     guaiFENesin -codeine  100-10 MG/5ML syrup Take 5 mLs by mouth every 6 (six) hours as needed for cough. 118 mL 0   levalbuterol  (XOPENEX  HFA) 45 MCG/ACT inhaler Inhale 2 puffs into the lungs every 6 (six) hours as needed for wheezing. 15 g 3   lidocaine -prilocaine  (EMLA ) cream Apply to affected area once 30 g 3   losartan  (COZAAR ) 25 MG tablet Take 0.5 tablets (12.5 mg total) by mouth daily. 45 tablet 1   meclizine  (ANTIVERT ) 25 MG tablet Take 1 tablet (25 mg total) by mouth 3 (three) times daily as needed for dizziness. 30 tablet 3   ondansetron  (ZOFRAN ) 8 MG tablet Take 1 tablet (8 mg total) by mouth every 8 (eight) hours as  needed for nausea or vomiting. Start on the third day after chemotherapy. 30 tablet 1   prochlorperazine  (COMPAZINE ) 10 MG tablet Take 1 tablet (10 mg total) by mouth every 6 (six) hours as needed for nausea or vomiting. 30 tablet 1   sucralfate (CARAFATE) 1 g tablet Take 1 tablet (1 g total) by mouth 3 (three) times daily before meals. 90 tablet 1   triamcinolone  ointment (KENALOG ) 0.5 % Apply 1 Application topically 2 (two) times daily. 30  g 6   No current facility-administered medications for this visit.    Review of Systems  Constitutional:  Negative for appetite change, chills, fatigue and fever.  HENT:   Negative for hearing loss and voice change.   Eyes:  Negative for eye problems.  Respiratory:  Positive for cough and hemoptysis. Negative for chest tightness.   Cardiovascular:  Negative for chest pain.  Gastrointestinal:  Negative for abdominal distention, abdominal pain and blood in stool.  Endocrine: Negative for hot flashes.  Genitourinary:  Negative for difficulty urinating and frequency.   Musculoskeletal:  Negative for arthralgias.  Skin:  Negative for itching and rash.  Neurological:  Negative for extremity weakness.  Hematological:  Negative for adenopathy.  Psychiatric/Behavioral:  Negative for confusion.      PHYSICAL EXAMINATION: ECOG PERFORMANCE STATUS: 0 - Asymptomatic  Vitals:   09/23/23 0916  BP: 131/76  Pulse: 98  Resp: 16  Temp: 97.8 F (36.6 C)  SpO2: 97%   Filed Weights   09/23/23 0916  Weight: 168 lb (76.2 kg)    Physical Exam Constitutional:      General: She is not in acute distress.    Appearance: She is not diaphoretic.  HENT:     Head: Normocephalic and atraumatic.   Eyes:     General: No scleral icterus.   Cardiovascular:     Rate and Rhythm: Normal rate and regular rhythm.     Heart sounds: No murmur heard. Pulmonary:     Effort: Pulmonary effort is normal. No respiratory distress.     Breath sounds: No wheezing.   Abdominal:     General: There is no distension.     Palpations: Abdomen is soft.     Tenderness: There is no abdominal tenderness.   Musculoskeletal:        General: Normal range of motion.     Cervical back: Normal range of motion and neck supple.   Skin:    General: Skin is warm and dry.     Findings: No erythema.   Neurological:     Mental Status: She is alert and oriented to person, place, and time. Mental status is at baseline.     Motor: No abnormal muscle tone.   Psychiatric:        Mood and Affect: Affect normal.      LABORATORY DATA:  I have reviewed the data as listed    Latest Ref Rng & Units 09/23/2023    9:04 AM 09/16/2023    9:07 AM 09/06/2023    8:54 AM  CBC  WBC 4.0 - 10.5 K/uL 4.3  5.3  8.4   Hemoglobin 12.0 - 15.0 g/dL 09.8  11.9  14.7   Hematocrit 36.0 - 46.0 % 35.9  37.6  40.4   Platelets 150 - 400 K/uL 151  300  295       Latest Ref Rng & Units 09/16/2023    9:07 AM 09/06/2023    8:54 AM 08/03/2023    2:26 PM  CMP  Glucose 70 - 99 mg/dL 829  562  130   BUN 8 - 23 mg/dL 15  14  14    Creatinine 0.44 - 1.00 mg/dL 8.65  7.84  6.96   Sodium 135 - 145 mmol/L 136  138  137   Potassium 3.5 - 5.1 mmol/L 3.7  4.1  3.7   Chloride 98 - 111 mmol/L 105  105  104   CO2 22 - 32 mmol/L 23  24  23   Calcium  8.9 - 10.3 mg/dL 8.7  9.3  9.5   Total Protein 6.5 - 8.1 g/dL 7.2  7.4  7.5   Total Bilirubin 0.0 - 1.2 mg/dL 0.6  0.7  0.7   Alkaline Phos 38 - 126 U/L 76  82  73   AST 15 - 41 U/L 24  16  19    ALT 0 - 44 U/L 25  20  22       RADIOGRAPHIC STUDIES: I have personally reviewed the radiological images as listed and agreed with the findings in the report. IR IMAGING GUIDED PORT INSERTION Result Date: 08/29/2023 INDICATION: 63 year old female referred for port catheter EXAM: IMAGE GUIDED PORT CATHETER MEDICATIONS: None ANESTHESIA/SEDATION: Moderate (conscious) sedation was employed during this procedure. A total of Versed  2.5 mg and Fentanyl  225 mcg was  administered intravenously. Moderate Sedation Time: 19 minutes. The patient's level of consciousness and vital signs were monitored continuously by radiology nursing throughout the procedure under my direct supervision. FLUOROSCOPY TIME:  Reference air kerma: 0 mGy COMPLICATIONS: None PROCEDURE: Informed written consent was obtained from the patient after a discussion of the risks, benefits, and alternatives to treatment. Questions regarding the procedure were encouraged and answered. The right neck and chest were prepped with chlorhexidine  in a sterile fashion, and a sterile drape was applied covering the operative field. Maximum barrier sterile technique with sterile gowns and gloves were used for the procedure. A timeout was performed prior to the initiation of the procedure. Ultrasound survey was performed with images stored and sent to PACs. Right IJ vein documented to be patent. The right neck and chest was prepped with chlorhexidine , and draped in the usual sterile fashion using maximum barrier technique (cap and mask, sterile gown, sterile gloves, large sterile sheet, hand hygiene and cutaneous antiseptic). Local anesthesia was attained by infiltration with 1% lidocaine  without epinephrine . Ultrasound demonstrated patency of the right internal jugular vein, and this was documented with an image. Under real-time ultrasound guidance, this vein was accessed with a 21 gauge micropuncture needle and image documentation was performed. A small dermatotomy was made at the access site with an 11 scalpel. A 0.018 wire was advanced into the SVC and used to estimate the length of the internal catheter. The access needle exchanged for a 25F micropuncture vascular sheath. The 0.018 wire was then removed and a 0.035 wire advanced into the IVC. An appropriate location for the subcutaneous reservoir was selected below the clavicle and an incision was made through the skin and underlying soft tissues. The subcutaneous  tissues were then dissected using a combination of blunt and sharp surgical technique and a pocket was formed. A single lumen power injectable portacatheter was then tunneled through the subcutaneous tissues from the pocket to the dermatotomy and the port reservoir placed within the subcutaneous pocket. The venous access site was then serially dilated and a peel away vascular sheath placed over the wire. The wire was removed and the port catheter advanced into position under fluoroscopic guidance. The catheter tip is positioned in the cavoatrial junction. This was documented with a spot image. The portacatheter was then tested and found to flush and aspirate well. The port was flushed with saline followed by 100 units/mL heparinized saline. The pocket was then closed in two layers using first subdermal inverted interrupted absorbable sutures followed by a running subcuticular suture. The epidermis was then sealed with Dermabond. The dermatotomy at the venous access site was also seal with Dermabond. Patient tolerated the procedure  well and remained hemodynamically stable throughout. No complications encountered and no significant blood loss encountered IMPRESSION: Status post right IJ port catheter placement Signed, Marciano Settles. Rexine Cater, RPVI [facility] Electronically Signed   By: Myrlene Asper D.O.   On: 08/29/2023 14:15

## 2023-09-23 NOTE — Assessment & Plan Note (Signed)
 Images and pathology results were reviewed with the patient. cT2 N2 M0, Stage III right lung adenocarcinoma.  NGS showed TPS 5% CPS 10  KRAS G12C, TMB 12.6  Labs are reviewed and discussed with patient. Proceed with carboplatin AUC 2, Taxol 45mg /m2

## 2023-09-23 NOTE — Consult Note (Signed)
 PHARMACY - ANTICOAGULATION CONSULT NOTE  Pharmacy Consult for Heparin  Indication: pulmonary embolus  Allergies  Allergen Reactions   Lisinopril  Cough    Patient Measurements: Height: 5' 4 (162.6 cm) Weight: 76.2 kg (168 lb) IBW/kg (Calculated) : 54.7 HEPARIN  DW (KG): 70.7  Vital Signs: Temp: 98.1 F (36.7 C) (06/16 1633) Temp Source: Oral (06/16 1633) BP: 111/80 (06/16 1633) Pulse Rate: 103 (06/16 1633)  Labs: Recent Labs    09/23/23 0904 09/23/23 1651  HGB 12.0 13.1  HCT 35.9* 39.0  PLT 151 273  LABPROT  --  12.5  INR  --  0.9  CREATININE 0.74 0.70  TROPONINIHS  --  3    Estimated Creatinine Clearance: 72.9 mL/min (by C-G formula based on SCr of 0.7 mg/dL).   Medical History: Past Medical History:  Diagnosis Date   COVID-19 07/2018   Hyperlipidemia    Hypertension    Lung cancer (HCC) 07/2023   Motion sickness    amuesment park rides   Osteopenia 11/01/2015   Pneumonia    april 2025   Psoriasis    Vertigo    none for over 5 yrs   Wears dentures    full upper and lower    Medications:  No history of chronic anticoagulation PTA  Assessment: 63 y.o. female with past medical history of hypertension, hyperlipidemia, and lung cancer who presents to the ED complaining of hemoptysis last night and this morning. CTA of chest shows segmental PE with no evidence of right heart strain. Pharmacy has been consulted to dose and monitor continuous heparin  infusion.  Baseline labs: aPTT pending, INR 0.9, Plts 273, hgb 13.1  Goal of Therapy:  Heparin  level 0.3-0.7 units/ml Monitor platelets by anticoagulation protocol: Yes   Plan:  Give 5000 units bolus x 1 Start heparin  infusion at 1200 units/hr Check anti-Xa level in 6 hours and daily while on heparin  Continue to monitor H&H and platelets  Tanya Howell A Tanya Howell 09/23/2023,8:20 PM

## 2023-09-23 NOTE — H&P (Signed)
 History and Physical    Tanya Howell ZOX:[id number] DOB: 09/12/1960 DOA: 09/23/2023  DOS: the patient was seen and examined on 09/23/2023  PCP: Solomon Dupre, DO   Patient coming from: Home  I have personally briefly reviewed patient's old medical records in [facility]  Chief Complaint: Hemoptysis  HPI: Tanya Howell is a pleasant 63 y.o. female with medical history significant for lung cancer s/p recent chemotherapy last one being today, HTN, HLD presented to ED complaining of hemoptysis started last night.  See coughed up around 8-9/4 size clots but she did not notice significant bleeding afterwards.  After getting up this morning she coughed up a couple of additional clots, but again did not notice any bleeding afterwards and presented for her scheduled chemotherapy visit.  She had a follow-up scheduled with her pulmonologist, who recommended for her to come to emergency room for admission for observation.  She denied any fever chills nausea vomiting, shortness of breath, leg swelling.  She denied any hematuria or abdominal pain.  ED Course: Upon arrival to the ED, patient is found to left-sided PE, started on heparin  drip and also there was some areas possibly looking like postobstructive pneumonia, started on ceftriaxone azithromycin  and hospitalist service was consulted for evaluation for admission.  Review of Systems:  ROS  All other systems negative except as noted in the HPI.  Past Medical History:  Diagnosis Date   COVID-19 07/2018   Hyperlipidemia    Hypertension    Lung cancer (HCC) 07/2023   Motion sickness    amuesment park rides   Osteopenia 11/01/2015   Pneumonia    april 2025   Psoriasis    Vertigo    none for over 5 yrs   Wears dentures    full upper and lower    Past Surgical History:  Procedure Laterality Date   BRONCHIAL BIOPSY  08/08/2023   Procedure: BRONCHOSCOPY, WITH BIOPSY;  Surgeon: Annitta Kindler, MD;  Location: [facility]  ENDOSCOPY;  Service: Pulmonary;;   BRONCHIAL BRUSHINGS  08/08/2023   Procedure: BRONCHOSCOPY, WITH BRUSH BIOPSY;  Surgeon: Annitta Kindler, MD;  Location: [facility] ENDOSCOPY;  Service: Pulmonary;;   BRONCHIAL NEEDLE ASPIRATION BIOPSY  08/08/2023   Procedure: BRONCHOSCOPY, WITH NEEDLE ASPIRATION BIOPSY;  Surgeon: Annitta Kindler, MD;  Location: [facility] ENDOSCOPY;  Service: Pulmonary;;   CHOLECYSTECTOMY  1990   COLONOSCOPY WITH PROPOFOL  N/A 07/15/2020   Procedure: COLONOSCOPY WITH PROPOFOL ;  Surgeon: Marnee Sink, MD;  Location: [facility];  Service: Endoscopy;  Laterality: N/A;  priority 4   ENDOBRONCHIAL ULTRASOUND Bilateral 08/08/2023   Procedure: ENDOBRONCHIAL ULTRASOUND (EBUS);  Surgeon: Annitta Kindler, MD;  Location: [facility] ENDOSCOPY;  Service: Pulmonary;  Laterality: Bilateral;   IR IMAGING GUIDED PORT INSERTION  08/29/2023   TUBAL LIGATION       reports that she quit smoking about 14 years ago. Her smoking use included cigarettes. She started smoking about 42 years ago. She has a 28 pack-year smoking history. She has never used smokeless tobacco. She reports that she does not drink alcohol and does not use drugs.  Allergies  Allergen Reactions   Lisinopril  Cough    Family History  Problem Relation Age of Onset   Colon cancer Brother        53    Breast cancer Mother    Breast cancer Maternal Aunt    Pneumonia Father    Diabetes Maternal Grandmother     Prior to Admission medications   Medication Sig Start Date End  Date Taking? Authorizing Provider  atorvastatin  (LIPITOR) 10 MG tablet TAKE 1 TABLET BY MOUTH AT BEDTIME 05/10/23  Yes Johnson, Megan P, DO  benzonatate  (TESSALON  PERLES) 100 MG capsule Take 1 capsule (100 mg total) by mouth 2 (two) times daily as needed for cough. 08/07/23 08/06/24 Yes Assaker, Marianne Shirts, MD  dexamethasone  (DECADRON ) 4 MG tablet Take 2 tablets daily for 2 days, start the day after chemotherapy. Take with food. 08/27/23  Yes Timmy Forbes, MD   guaifenesin  (ROBITUSSIN) 100 MG/5ML syrup Take 200 mg by mouth 3 (three) times daily as needed for cough.   Yes [provider]  guaiFENesin -codeine  100-10 MG/5ML syrup Take 5 mLs by mouth every 6 (six) hours as needed for cough. 09/09/23  Yes Timmy Forbes, MD  levalbuterol  (XOPENEX  HFA) 45 MCG/ACT inhaler Inhale 2 puffs into the lungs every 6 (six) hours as needed for wheezing. 08/07/23  Yes Assaker, Marianne Shirts, MD  lidocaine -prilocaine  (EMLA ) cream Apply to affected area once 08/27/23  Yes Timmy Forbes, MD  losartan  (COZAAR ) 25 MG tablet Take 0.5 tablets (12.5 mg total) by mouth daily. 02/18/23  Yes Johnson, Megan P, DO  meclizine  (ANTIVERT ) 25 MG tablet Take 1 tablet (25 mg total) by mouth 3 (three) times daily as needed for dizziness. 11/13/22  Yes Johnson, Megan P, DO  ondansetron  (ZOFRAN ) 8 MG tablet Take 1 tablet (8 mg total) by mouth every 8 (eight) hours as needed for nausea or vomiting. Start on the third day after chemotherapy. 08/27/23  Yes Timmy Forbes, MD  prochlorperazine  (COMPAZINE ) 10 MG tablet Take 1 tablet (10 mg total) by mouth every 6 (six) hours as needed for nausea or vomiting. 08/27/23  Yes Timmy Forbes, MD  sucralfate (CARAFATE) 1 g tablet Take 1 tablet (1 g total) by mouth 3 (three) times daily before meals. 09/20/23  Yes Chrystal, Allison Ivory, MD  triamcinolone  ointment (KENALOG ) 0.5 % Apply 1 Application topically 2 (two) times daily. Patient not taking: Reported on 09/23/2023 12/19/22   Solomon Dupre, DO    Physical Exam: Vitals:   09/23/23 1633 09/23/23 1634  BP: 111/80   Pulse: (!) 103   Resp: 16   Temp: 98.1 F (36.7 C)   TempSrc: Oral   SpO2: 96%   Weight:  76.2 kg  Height:  5' 4 (1.626 m)    Physical Exam  Constitutional: Alert, awake, calm, comfortable HEENT: Neck supple Respiratory: clear to auscultation bilaterally, no wheezing, no crackles. Normal respiratory effort. No accessory muscle use.  Cardiovascular: Regular rate and rhythm, no murmurs / rubs / gallops. No  extremity edema. 2+ pedal pulses. No carotid bruits.  Abdomen: no tenderness, no masses palpated. No hepatosplenomegaly. Bowel sounds positive.  Musculoskeletal: no clubbing / cyanosis. No joint deformity upper and lower extremities. Good ROM, no contractures. Normal muscle tone.  Skin: no rashes, lesions, ulcers. No induration Neurologic: CN 2-12 grossly intact. Sensation intact, DTR normal. Strength 5/5 x all 4 extremities.  Psychiatric: Normal judgment and insight. Alert and oriented x 3. Normal mood.    Labs on Admission: I have personally reviewed following labs and imaging studies  CBC: Recent Labs  Lab 09/23/23 0904 09/23/23 1651  WBC 4.3 3.8*  NEUTROABS 3.1 3.5  HGB 12.0 13.1  HCT 35.9* 39.0  MCV 83.1 83.5  PLT 151 273   Basic Metabolic Panel: Recent Labs  Lab 09/23/23 0904 09/23/23 1651  NA 133* 137  K 3.8 4.1  CL 104 106  CO2 23 19*  GLUCOSE 100* 155*  BUN 16  16  CREATININE 0.74 0.70  CALCIUM  9.1 9.3   GFR: Estimated Creatinine Clearance: 72.9 mL/min (by C-G formula based on SCr of 0.7 mg/dL). Liver Function Tests: Recent Labs  Lab 09/23/23 0904  AST 23  ALT 29  ALKPHOS 65  BILITOT 0.6  PROT 6.7  ALBUMIN 4.0   No results for input(s): LIPASE, AMYLASE in the last 168 hours. No results for input(s): AMMONIA in the last 168 hours. Coagulation Profile: Recent Labs  Lab 09/23/23 1651  INR 0.9   Cardiac Enzymes: Recent Labs  Lab 09/23/23 1651  TROPONINIHS 3   BNP (last 3 results) No results for input(s): BNP in the last 8760 hours. HbA1C: No results for input(s): HGBA1C in the last 72 hours. CBG: No results for input(s): GLUCAP in the last 168 hours. Lipid Profile: No results for input(s): CHOL, HDL, LDLCALC, TRIG, CHOLHDL, LDLDIRECT in the last 72 hours. Thyroid Function Tests: No results for input(s): TSH, T4TOTAL, FREET4, T3FREE, THYROIDAB in the last 72 hours. Anemia Panel: No results for input(s):  VITAMINB12, FOLATE, FERRITIN, TIBC, IRON, RETICCTPCT in the last 72 hours. Urine analysis:    Component Value Date/Time   COLORURINE YELLOW (A) 10/12/2021 0013   APPEARANCEUR Clear 11/13/2022 1552   LABSPEC 1.017 10/12/2021 0013   PHURINE 6.0 10/12/2021 0013   GLUCOSEU Negative 11/13/2022 1552   HGBUR SMALL (A) 10/12/2021 0013   BILIRUBINUR Negative 11/13/2022 1552   KETONESUR NEGATIVE 10/12/2021 0013   PROTEINUR Trace (A) 11/13/2022 1552   PROTEINUR NEGATIVE 10/12/2021 0013   NITRITE Negative 11/13/2022 1552   NITRITE NEGATIVE 10/12/2021 0013   LEUKOCYTESUR Negative 11/13/2022 1552   LEUKOCYTESUR NEGATIVE 10/12/2021 0013    Radiological Exams on Admission: I have personally reviewed images CT Angio Chest PE W and/or Wo Contrast Result Date: 09/23/2023 CLINICAL DATA:  Hemoptysis.  Lung cancer.  * Tracking Code: BO * EXAM: CT ANGIOGRAPHY CHEST WITH CONTRAST TECHNIQUE: Multidetector CT imaging of the chest was performed using the standard protocol during bolus administration of intravenous contrast. Multiplanar CT image reconstructions and MIPs were obtained to evaluate the vascular anatomy. RADIATION DOSE REDUCTION: This exam was performed according to the departmental dose-optimization program which includes automated exposure control, adjustment of the mA and/or kV according to patient size and/or use of iterative reconstruction technique. CONTRAST:  75mL OMNIPAQUE  IOHEXOL  350 MG/ML SOLN COMPARISON:  08/03/2023. FINDINGS: Cardiovascular: There are filling defects in the lingular segmental bronchi (6/114-123). Atherosclerotic calcification of the aorta and circumflex coronary artery. Heart is enlarged. No pericardial effusion. Right IJ Port-A-Cath terminates at the SVC RA junction. Mediastinum/Nodes: Subcarinal and right hilar adenopathy measures 2.5 cm and 3.2 cm, respectively, increased in size from 2.0 cm and 2.6 cm, respectively, on 08/03/2023. No left hilar or axillary  adenopathy. Esophagus is grossly unremarkable. Lungs/Pleura: Right hilar adenopathy markedly narrows the right middle and right lower lobe bronchi, creating endobronchial debris and airspace consolidation in the right middle and right lower lobes, increased from 07/14/2023. No pleural fluid. Airway is otherwise unremarkable. Upper Abdomen: Cholecystectomy. Visualized portions of the liver, adrenal glands, kidneys, spleen, pancreas, stomach and bowel are otherwise grossly unremarkable. No upper abdominal adenopathy. Musculoskeletal: Degenerative changes in the spine. Review of the MIP images confirms the above findings. IMPRESSION: 1. Segmental pulmonary emboli in the lingula. Critical Value/emergent results were called by telephone at the time of interpretation on 09/23/2023 at 6:49 pm to provider JENISE MENSHEW, who verbally acknowledged these results. 2. Enlarging right hilar and subcarinal nodal masses, compatible with biopsy-proven adenocarcinoma. Associated  marked narrowing of the right middle and right lower lobe bronchi with endobronchial debris and postobstructive airspace consolidation in the right middle and right lower lobes. 3. Aortic atherosclerosis (ICD10-I70.0). Circumflex coronary artery calcification. Electronically Signed   By: Shearon Denis M.D.   On: 09/23/2023 18:50    EKG: My personal interpretation of EKG shows:     Assessment/Plan Principal Problem:   Pulmonary embolism (HCC) Active Problems:   Psoriasis   Vertigo   HTN (hypertension)   Primary lung adenocarcinoma, right [facility])   Hemoptysis     63 year old female with history of hypertension, hyperlipidemia, recent diagnosis of primary adenocarcinoma of the lung on chemotherapy presented with hemoptysis.  1.  Pulmonary embolism of the lungs - Patient was started on heparin  drip - She is not hypoxemic or has any persistent symptoms. - I will continue heparin  drip overnight. - In the morning we can transition to  possibly Eliquis and discharged home.  2.  Suspected community-acquired pneumonia in the setting of lung cancer - Patient received ceftriaxone and azithromycin . - Will continue ceftriaxone and azithromycin  tomorrow.  Likely discharge home on oral antibiotics. - Follow-up cultures  3.  HTN - Blood pressure is stable - Continue home medication - Continue to monitor blood pressure  4.  Lung cancer - Continue outpatient treatment as scheduled with primary on-call   DVT prophylaxis: IV heparin  gtts Code Status: Full Code Family Communication: Patient and her husband Disposition Plan: Home Consults called: N/A Admission status: Observation, Med-Surg   Beatris Bough, MD [facility] 09/23/2023, 8:31 PM

## 2023-09-23 NOTE — Progress Notes (Signed)
 Met with patient during follow up visit with Dr. Wilhelmenia Harada. Pt needed assistance with FMLA forms. All forms completed and faxed to [facility]. Copy provided to patient. Pt scheduled for follow up with Dr. Darnelle Elders this afternoon after chemotherapy. Pt made aware and confirmed appt. Nothing further needed at this time. Instructed to call with any questions or needs. Pt verbalized understanding.

## 2023-09-23 NOTE — Assessment & Plan Note (Addendum)
 No recurrent hemoptysis.  No anemia on recent CBC. Discussed with patient about ER triggers Avoid NSAIDs Discussed with pulmonology, she will be seen this afternoon.

## 2023-09-23 NOTE — ED Provider Triage Note (Signed)
 Emergency Medicine Provider Triage Evaluation Note  Tanya Howell , a 63 y.o. female  was evaluated in triage.  Pt complains of dizziness in the last 24 hours.  Patient has history of lung cancer treated with radiotherapy and chemotherapy.  She consulted her pulmonologist who recommended her to come here to have a CTA.  Denies fever, chest pain, she is not taking any blood thinners.  Patient states having productive cough last episode of hemoptysis this morning.  Patient is no feeling lightheaded.  Review of Systems  Positive:  Negative:   Physical Exam  BP 111/80 (BP Location: Right Arm)   Pulse (!) 103   Temp 98.1 F (36.7 C) (Oral)   Resp 16   Ht 5' 4 (1.626 m)   Wt 76.2 kg   SpO2 96%   BMI 28.84 kg/m during triage patient is tachycardic, saturations 96 Gen:   Awake, no distress Resp:  Normal effort  MSK:   Moves extremities without difficulty  Other:  Patient has a port  Medical Decision Making  Medically screening exam initiated at 4:42 PM.  Appropriate orders placed.  Tanya Howell was informed that the remainder of the evaluation will be completed by another provider, this initial triage assessment does not replace that evaluation, and the importance of remaining in the ED until their evaluation is complete.  Patient presents today with history of hemoptysis in the last 24 hours, patient has a history of lung cancer denies treated with chemotherapy and radiotherapy, patient consulted with pulmonologist who recommended to come here for CTA.  Patient denies symptoms or signs and symptoms of infection, is not taking any blood thinners.  Ordered CTA, INR, CBC CMP. CTA  authorized by Dr. Marijane Shoulders, [place], PA-C 09/23/23 [phone]

## 2023-09-24 ENCOUNTER — Ambulatory Visit

## 2023-09-24 ENCOUNTER — Other Ambulatory Visit: Payer: Self-pay

## 2023-09-24 ENCOUNTER — Other Ambulatory Visit (HOSPITAL_COMMUNITY): Payer: Self-pay

## 2023-09-24 ENCOUNTER — Telehealth (HOSPITAL_COMMUNITY): Payer: Self-pay | Admitting: Pharmacy Technician

## 2023-09-24 ENCOUNTER — Encounter: Payer: Self-pay | Admitting: Oncology

## 2023-09-24 DIAGNOSIS — I2693 Single subsegmental pulmonary embolism without acute cor pulmonale: Secondary | ICD-10-CM | POA: Diagnosis not present

## 2023-09-24 LAB — CBC
HCT: 35.1 % — ABNORMAL LOW (ref 36.0–46.0)
Hemoglobin: 11.7 g/dL — ABNORMAL LOW (ref 12.0–15.0)
MCH: 27.9 pg (ref 26.0–34.0)
MCHC: 33.3 g/dL (ref 30.0–36.0)
MCV: 83.6 fL (ref 80.0–100.0)
Platelets: 264 K/uL (ref 150–400)
RBC: 4.2 MIL/uL (ref 3.87–5.11)
RDW: 12.9 % (ref 11.5–15.5)
WBC: 3.9 K/uL — ABNORMAL LOW (ref 4.0–10.5)
nRBC: 0 % (ref 0.0–0.2)

## 2023-09-24 LAB — PROTIME-INR
INR: 1 (ref 0.8–1.2)
Prothrombin Time: 13.5 s (ref 11.4–15.2)

## 2023-09-24 LAB — BASIC METABOLIC PANEL WITH GFR
Anion gap: 7 (ref 5–15)
BUN: 18 mg/dL (ref 8–23)
CO2: 23 mmol/L (ref 22–32)
Calcium: 9.1 mg/dL (ref 8.9–10.3)
Chloride: 108 mmol/L (ref 98–111)
Creatinine, Ser: 0.57 mg/dL (ref 0.44–1.00)
GFR, Estimated: >60 mL/min (ref >60)
Glucose, Bld: 124 mg/dL — ABNORMAL HIGH (ref 70–99)
Potassium: 3.9 mmol/L (ref 3.5–5.1)
Sodium: 138 mmol/L (ref 135–145)

## 2023-09-24 LAB — HEPARIN LEVEL (UNFRACTIONATED)
Heparin Unfractionated: 0.73 [IU]/mL — ABNORMAL HIGH (ref 0.30–0.70)
Heparin Unfractionated: 0.46 [IU]/mL (ref 0.30–0.70)

## 2023-09-24 MED ORDER — APIXABAN 5 MG PO TABS: 5.0000 mg | ORAL_TABLET | Freq: Two times a day (BID) | ORAL | Status: DC

## 2023-09-24 MED ORDER — APIXABAN (ELIQUIS) EDUCATION KIT FOR DVT/PE PATIENTS
PACK | Freq: Once | Status: AC
  Filled 2023-09-24: qty 1

## 2023-09-24 MED ORDER — APIXABAN 5 MG PO TABS
10.0000 mg | ORAL_TABLET | Freq: Two times a day (BID) | ORAL | Status: DC
  Administered 2023-09-24: 10 mg via ORAL
  Filled 2023-09-24: qty 2

## 2023-09-24 MED ORDER — AMOXICILLIN-POT CLAVULANATE 875-125 MG PO TABS
1.0000 | ORAL_TABLET | Freq: Two times a day (BID) | ORAL | 0 refills | Status: AC
  Filled 2023-09-24: qty 10, 5d supply, fill #0

## 2023-09-24 MED ORDER — APIXABAN (ELIQUIS) VTE STARTER PACK (10MG AND 5MG)
ORAL_TABLET | ORAL | 0 refills | Status: DC
  Filled 2023-09-24: qty 74, 28d supply, fill #0

## 2023-09-24 NOTE — Discharge Summary (Signed)
 Physician Discharge Summary   Patient: Tanya Howell MRN: [id number] DOB: May 24, 1960  Admit date:     09/23/2023  Discharge date: 09/24/23  Discharge Physician: Tanya Howell   PCP: Tanya Dupre, DO   Recommendations at discharge:   Follow up with Primary Care in 1-2 weeks Follow up with Oncology and Radiation Oncology as scheduled Patient started on Eliquis for PE - follow up  Patient discharged with oral antibiotics due to CT findings concerning for post-obstructive pneumonia. Follow up.  Discharge Diagnoses: Principal Problem:   Pulmonary embolism (HCC) Active Problems:   Psoriasis   Vertigo   HTN (hypertension)   Primary lung adenocarcinoma, right (HCC)   Hemoptysis  Resolved Problems:   * No resolved hospital problems. [facility] Course:  HPI on admission: Tanya Howell is a pleasant 63 y.o. female with medical history significant for lung cancer s/p recent chemotherapy last one being today, HTN, HLD presented to ED complaining of hemoptysis started last night.  See coughed up around 8-9/4 size clots but she did not notice significant bleeding afterwards.  After getting up this morning she coughed up a couple of additional clots, but again did not notice any bleeding afterwards and presented for her scheduled chemotherapy visit.  She had a follow-up scheduled with her pulmonologist, who recommended for her to come to emergency room for admission for observation.  She denied any fever chills nausea vomiting, shortness of breath, leg swelling.  She denied any hematuria or abdominal pain.   ED Course: Upon arrival to the ED, patient is found to left-sided PE, started on heparin  drip and also there was some areas possibly looking like postobstructive pneumonia, started on ceftriaxone azithromycin  and hospitalist service was consulted for evaluation for admission.  Patient was admitted to hospitalist service and started on IV heparin . There were no complicating  factors to PE - patient did not require any oxygen and has been hemodynamically stable.     6/17 Seen by Pulmonology.  Hemoptysis likely was due to the PE and has completely resolved with no recurrence since admission.  Patient is cleared for discharge by Pulmonology and stable from overall medical standpoint.  Pt agreeable and requesting discharge home today.    Assessment and Plan:  Acute Pulmonary Embolism - segmental, in the lingula. Secondary to malignancy. No hypoxia or hemodynamic changes Hemoptysis prior to admission has resolved --Treated with IV heparin  drip --Transition to PO Elqiuis today: 10 mg BID x 7 days, then 5 mg BID    Suspected post-obstructive pneumonia in the setting of lung cancer --Treated with IV ceftriaxone and azithromycin  --Discharge on PO Augmentin  x 5 days   HTN  Blood pressure is stable - Continue home medications  Adenocarcinoma of the lung  - Continue outpatient treatment as scheduled       Consultants: Pulmonology Procedures performed: None  Disposition: Home Diet recommendation:  Discharge Diet Orders (From admission, onward)     Start     Ordered   09/24/23 0000  Diet - regular        09/24/23 1448            DISCHARGE MEDICATION: Allergies as of 09/24/2023       Reactions   Lisinopril  Cough        Medication List     TAKE these medications    amoxicillin -clavulanate 875-125 MG tablet Commonly known as: AUGMENTIN  Take 1 tablet by mouth 2 (two) times daily for 5 days.   Apixaban Starter  Pack (10mg  and 5mg ) Commonly known as: ELIQUIS STARTER PACK Take as directed on package: start with two-5mg  tablets twice daily for 7 days. On day 8, switch to one-5mg  tablet twice daily.   atorvastatin  10 MG tablet Commonly known as: LIPITOR TAKE 1 TABLET BY MOUTH AT BEDTIME   benzonatate  100 MG capsule Commonly known as: Tessalon  Perles Take 1 capsule (100 mg total) by mouth 2 (two) times daily as needed for cough.    dexamethasone  4 MG tablet Commonly known as: DECADRON  Take 2 tablets daily for 2 days, start the day after chemotherapy. Take with food.   guaifenesin  100 MG/5ML syrup Commonly known as: ROBITUSSIN Take 200 mg by mouth 3 (three) times daily as needed for cough.   guaiFENesin -codeine  100-10 MG/5ML syrup Take 5 mLs by mouth every 6 (six) hours as needed for cough.   levalbuterol  45 MCG/ACT inhaler Commonly known as: XOPENEX  HFA Inhale 2 puffs into the lungs every 6 (six) hours as needed for wheezing.   lidocaine -prilocaine  cream Commonly known as: EMLA  Apply to affected area once   losartan  25 MG tablet Commonly known as: COZAAR  Take 0.5 tablets (12.5 mg total) by mouth daily.   meclizine  25 MG tablet Commonly known as: ANTIVERT  Take 1 tablet (25 mg total) by mouth 3 (three) times daily as needed for dizziness.   ondansetron  8 MG tablet Commonly known as: Zofran  Take 1 tablet (8 mg total) by mouth every 8 (eight) hours as needed for nausea or vomiting. Start on the third day after chemotherapy.   prochlorperazine  10 MG tablet Commonly known as: COMPAZINE  Take 1 tablet (10 mg total) by mouth every 6 (six) hours as needed for nausea or vomiting.   sucralfate 1 g tablet Commonly known as: CARAFATE Take 1 tablet (1 g total) by mouth 3 (three) times daily before meals.   triamcinolone  ointment 0.5 % Commonly known as: KENALOG  Apply 1 Application topically 2 (two) times daily.        Discharge Exam: Filed Weights   09/23/23 1634  Weight: 76.2 kg   General exam: awake, alert, no acute distress HEENT: atraumatic, clear conjunctiva, anicteric sclera, moist mucus membranes, hearing grossly normal  Respiratory system: CTAB with diminished right base, no wheezes, rales or rhonchi, normal respiratory effort. On room air Cardiovascular system: normal S1/S2,  RRR, no JVD, murmurs, rubs, gallops,  no pedal edema.   Gastrointestinal system: soft, NT, ND, no HSM felt, +bowel  sounds. Central nervous system: A&O x4. no gross focal neurologic deficits, normal speech Extremities: moves all , no edema, normal tone Skin: dry, intact, normal temperature, normal color, No rashes, lesions or ulcers Psychiatry: normal mood, congruent affect, judgement and insight appear normal   Condition at discharge: stable  The results of significant diagnostics from this hospitalization (including imaging, microbiology, ancillary and laboratory) are listed below for reference.   Imaging Studies: CT Angio Chest PE W and/or Wo Contrast Result Date: 09/23/2023 CLINICAL DATA:  Hemoptysis.  Lung cancer.  * Tracking Code: BO * EXAM: CT ANGIOGRAPHY CHEST WITH CONTRAST TECHNIQUE: Multidetector CT imaging of the chest was performed using the standard protocol during bolus administration of intravenous contrast. Multiplanar CT image reconstructions and MIPs were obtained to evaluate the vascular anatomy. RADIATION DOSE REDUCTION: This exam was performed according to the departmental dose-optimization program which includes automated exposure control, adjustment of the mA and/or kV according to patient size and/or use of iterative reconstruction technique. CONTRAST:  75mL OMNIPAQUE  IOHEXOL  350 MG/ML SOLN COMPARISON:  08/03/2023. FINDINGS:  Cardiovascular: There are filling defects in the lingular segmental bronchi (6/114-123). Atherosclerotic calcification of the aorta and circumflex coronary artery. Heart is enlarged. No pericardial effusion. Right IJ Port-A-Cath terminates at the SVC RA junction. Mediastinum/Nodes: Subcarinal and right hilar adenopathy measures 2.5 cm and 3.2 cm, respectively, increased in size from 2.0 cm and 2.6 cm, respectively, on 08/03/2023. No left hilar or axillary adenopathy. Esophagus is grossly unremarkable. Lungs/Pleura: Right hilar adenopathy markedly narrows the right middle and right lower lobe bronchi, creating endobronchial debris and airspace consolidation in the right  middle and right lower lobes, increased from 07/14/2023. No pleural fluid. Airway is otherwise unremarkable. Upper Abdomen: Cholecystectomy. Visualized portions of the liver, adrenal glands, kidneys, spleen, pancreas, stomach and bowel are otherwise grossly unremarkable. No upper abdominal adenopathy. Musculoskeletal: Degenerative changes in the spine. Review of the MIP images confirms the above findings. IMPRESSION: 1. Segmental pulmonary emboli in the lingula. Critical Value/emergent results were called by telephone at the time of interpretation on 09/23/2023 at 6:49 pm to provider JENISE MENSHEW, who verbally acknowledged these results. 2. Enlarging right hilar and subcarinal nodal masses, compatible with biopsy-proven adenocarcinoma. Associated marked narrowing of the right middle and right lower lobe bronchi with endobronchial debris and postobstructive airspace consolidation in the right middle and right lower lobes. 3. Aortic atherosclerosis (ICD10-I70.0). Circumflex coronary artery calcification. Electronically Signed   By: Shearon Denis M.D.   On: 09/23/2023 18:50   IR IMAGING GUIDED PORT INSERTION Result Date: 08/29/2023 INDICATION: 63 year old female referred for port catheter EXAM: IMAGE GUIDED PORT CATHETER MEDICATIONS: None ANESTHESIA/SEDATION: Moderate (conscious) sedation was employed during this procedure. A total of Versed  2.5 mg and Fentanyl  225 mcg was administered intravenously. Moderate Sedation Time: 19 minutes. The patient's level of consciousness and vital signs were monitored continuously by radiology nursing throughout the procedure under my direct supervision. FLUOROSCOPY TIME:  Reference air kerma: 0 mGy COMPLICATIONS: None PROCEDURE: Informed written consent was obtained from the patient after a discussion of the risks, benefits, and alternatives to treatment. Questions regarding the procedure were encouraged and answered. The right neck and chest were prepped with chlorhexidine  in  a sterile fashion, and a sterile drape was applied covering the operative field. Maximum barrier sterile technique with sterile gowns and gloves were used for the procedure. A timeout was performed prior to the initiation of the procedure. Ultrasound survey was performed with images stored and sent to PACs. Right IJ vein documented to be patent. The right neck and chest was prepped with chlorhexidine , and draped in the usual sterile fashion using maximum barrier technique (cap and mask, sterile gown, sterile gloves, large sterile sheet, hand hygiene and cutaneous antiseptic). Local anesthesia was attained by infiltration with 1% lidocaine  without epinephrine . Ultrasound demonstrated patency of the right internal jugular vein, and this was documented with an image. Under real-time ultrasound guidance, this vein was accessed with a 21 gauge micropuncture needle and image documentation was performed. A small dermatotomy was made at the access site with an 11 scalpel. A 0.018 wire was advanced into the SVC and used to estimate the length of the internal catheter. The access needle exchanged for a 34F micropuncture vascular sheath. The 0.018 wire was then removed and a 0.035 wire advanced into the IVC. An appropriate location for the subcutaneous reservoir was selected below the clavicle and an incision was made through the skin and underlying soft tissues. The subcutaneous tissues were then dissected using a combination of blunt and sharp surgical technique and a pocket was formed.  A single lumen power injectable portacatheter was then tunneled through the subcutaneous tissues from the pocket to the dermatotomy and the port reservoir placed within the subcutaneous pocket. The venous access site was then serially dilated and a peel away vascular sheath placed over the wire. The wire was removed and the port catheter advanced into position under fluoroscopic guidance. The catheter tip is positioned in the cavoatrial  junction. This was documented with a spot image. The portacatheter was then tested and found to flush and aspirate well. The port was flushed with saline followed by 100 units/mL heparinized saline. The pocket was then closed in two layers using first subdermal inverted interrupted absorbable sutures followed by a running subcuticular suture. The epidermis was then sealed with Dermabond. The dermatotomy at the venous access site was also seal with Dermabond. Patient tolerated the procedure well and remained hemodynamically stable throughout. No complications encountered and no significant blood loss encountered IMPRESSION: Status post right IJ port catheter placement Signed, Marciano Settles. Rexine Cater, RPVI [facility] Radiology Electronically Signed   By: Myrlene Asper D.O.   On: 08/29/2023 14:15    Microbiology: Results for orders placed or performed during the hospital encounter of 08/03/23  Resp panel by RT-PCR (RSV, Flu A&B, Covid) Anterior Nasal Swab     Status: None   Collection Time: 08/03/23  2:26 PM   Specimen: Anterior Nasal Swab  Result Value Ref Range Status   SARS Coronavirus 2 by RT PCR NEGATIVE NEGATIVE Final    Comment: (NOTE) SARS-CoV-2 target nucleic acids are NOT DETECTED.  The SARS-CoV-2 RNA is generally detectable in upper respiratory specimens during the acute phase of infection. The lowest concentration of SARS-CoV-2 viral copies this assay can detect is 138 copies/mL. A negative result does not preclude SARS-Cov-2 infection and should not be used as the sole basis for treatment or other patient management decisions. A negative result may occur with  improper specimen collection/handling, submission of specimen other than nasopharyngeal swab, presence of viral mutation(s) within the areas targeted by this assay, and inadequate number of viral copies(<138 copies/mL). A negative result must be combined with clinical  observations, patient history, and epidemiological information. The expected result is Negative.  Fact Sheet for Patients:  BloggerCourse.com  Fact Sheet for Healthcare Providers:  SeriousBroker.it  This test is no t yet approved or cleared by the [place]  FDA and  has been authorized for detection and/or diagnosis of SARS-CoV-2 by FDA under an Emergency Use Authorization (EUA). This EUA will remain  in effect (meaning this test can be used) for the duration of the COVID-19 declaration under Section 564(b)(1) of the Act, 21 U.S.C.section 360bbb-3(b)(1), unless the authorization is terminated  or revoked sooner.       Influenza A by PCR NEGATIVE NEGATIVE Final   Influenza B by PCR NEGATIVE NEGATIVE Final    Comment: (NOTE) The Xpert Xpress SARS-CoV-2/FLU/RSV plus assay is intended as an aid in the diagnosis of influenza from Nasopharyngeal swab specimens and should not be used as a sole basis for treatment. Nasal washings and aspirates are unacceptable for Xpert Xpress SARS-CoV-2/FLU/RSV testing.  Fact Sheet for Patients: BloggerCourse.com  Fact Sheet for Healthcare Providers: SeriousBroker.it  This test is not yet approved or cleared by the [place]  FDA and has been authorized for detection and/or diagnosis of SARS-CoV-2 by FDA under an Emergency Use Authorization (EUA). This EUA will remain in effect (meaning this test can be used) for the duration of  the COVID-19 declaration under Section 564(b)(1) of the Act, 21 U.S.C. section 360bbb-3(b)(1), unless the authorization is terminated or revoked.     Resp Syncytial Virus by PCR NEGATIVE NEGATIVE Final    Comment: (NOTE) Fact Sheet for Patients: BloggerCourse.com  Fact Sheet for Healthcare Providers: SeriousBroker.it  This test is not yet approved or cleared by  the [place]  FDA and has been authorized for detection and/or diagnosis of SARS-CoV-2 by FDA under an Emergency Use Authorization (EUA). This EUA will remain in effect (meaning this test can be used) for the duration of the COVID-19 declaration under Section 564(b)(1) of the Act, 21 U.S.C. section 360bbb-3(b)(1), unless the authorization is terminated or revoked.  Performed at [facility] [place], [address] [place], [place] [address]     Labs: CBC: Recent Labs  Lab 09/23/23 0904 09/23/23 1651 09/24/23 0521  WBC 4.3 3.8* 3.9*  NEUTROABS 3.1 3.5  --   HGB 12.0 13.1 11.7*  HCT 35.9* 39.0 35.1*  MCV 83.1 83.5 83.6  PLT 151 273 264   Basic Metabolic Panel: Recent Labs  Lab 09/23/23 0904 09/23/23 1651 09/24/23 0521  NA 133* 137 138  K 3.8 4.1 3.9  CL 104 106 108  CO2 23 19* 23  GLUCOSE 100* 155* 124*  BUN 16 16 18   CREATININE 0.74 0.70 0.57  CALCIUM  9.1 9.3 9.1   Liver Function Tests: Recent Labs  Lab 09/23/23 0904  AST 23  ALT 29  ALKPHOS 65  BILITOT 0.6  PROT 6.7  ALBUMIN 4.0   CBG: No results for input(s): GLUCAP in the last 168 hours.  Discharge time spent: greater than 30 minutes.  Signed: Montey Apa, DO [facility] 09/24/2023

## 2023-09-24 NOTE — Consult Note (Signed)
 PHARMACY - ANTICOAGULATION CONSULT NOTE  Pharmacy Consult for Heparin  Indication: pulmonary embolus  Allergies  Allergen Reactions   Lisinopril  Cough    Patient Measurements: Height: 5' 4 (162.6 cm) Weight: 76.2 kg (168 lb) IBW/kg (Calculated) : 54.7 HEPARIN  DW (KG): 70.7  Vital Signs: Temp: 98.6 F (37 C) (06/17 1241) Temp Source: Oral (06/17 1241) BP: 123/64 (06/17 1241) Pulse Rate: 86 (06/17 1241)  Labs: Recent Labs    09/23/23 0904 09/23/23 1651 09/23/23 2140 09/24/23 0521 09/24/23 1232  HGB 12.0 13.1  --  11.7*  --   HCT 35.9* 39.0  --  35.1*  --   PLT 151 273  --  264  --   APTT  --   --  >200*  --   --   LABPROT  --  12.5  --  13.5  --   INR  --  0.9  --  1.0  --   HEPARINUNFRC  --   --   --  0.73* 0.46  CREATININE 0.74 0.70  --  0.57  --   TROPONINIHS  --  3 3  --   --     Estimated Creatinine Clearance: 72.9 mL/min (by C-G formula based on SCr of 0.57 mg/dL).   Medical History: Past Medical History:  Diagnosis Date   COVID-19 07/2018   Hyperlipidemia    Hypertension    Lung cancer (HCC) 07/2023   Motion sickness    amuesment park rides   Osteopenia 11/01/2015   Pneumonia    april 2025   Psoriasis    Vertigo    none for over 5 yrs   Wears dentures    full upper and lower    Medications:  No history of chronic anticoagulation PTA  Assessment: 63 y.o. female with past medical history of hypertension, hyperlipidemia, and lung cancer who presents to the ED complaining of hemoptysis last night and this morning. CTA of chest shows segmental PE with no evidence of right heart strain. Pharmacy has been consulted to dose and monitor continuous heparin  infusion.  Baseline labs: aPTT pending, INR 0.9, Plts 273, hgb 13.1  Goal of Therapy:  Heparin  level 0.3-0.7 units/ml Monitor platelets by anticoagulation protocol: Yes   6/17:  HL @ 0521 = 0.73, elevated  Plan:  6/17: HL 0.46, therapeutic x 1 - will continue heparin  drip rate at 1100  units/hr - check confirmatory HL in 6 hours   -continue to monitor H&H and platelets  Oliver Heitzenrater A Alicia Seib 09/24/2023,1:00 PM

## 2023-09-24 NOTE — Consult Note (Signed)
 PHARMACY - ANTICOAGULATION CONSULT NOTE  Pharmacy Consult for Heparin  Indication: pulmonary embolus  Allergies  Allergen Reactions   Lisinopril  Cough    Patient Measurements: Height: 5' 4 (162.6 cm) Weight: 76.2 kg (168 lb) IBW/kg (Calculated) : 54.7 HEPARIN  DW (KG): 70.7  Vital Signs: Temp: 98.1 F (36.7 C) (06/17 0230) Temp Source: Oral (06/17 0230) BP: 99/70 (06/17 0456) Pulse Rate: 87 (06/17 0456)  Labs: Recent Labs    09/23/23 0904 09/23/23 1651 09/23/23 2140 09/24/23 0521  HGB 12.0 13.1  --  11.7*  HCT 35.9* 39.0  --  35.1*  PLT 151 273  --  264  APTT  --   --  >200*  --   LABPROT  --  12.5  --  13.5  INR  --  0.9  --  1.0  HEPARINUNFRC  --   --   --  0.73*  CREATININE 0.74 0.70  --  0.57  TROPONINIHS  --  3 3  --     Estimated Creatinine Clearance: 72.9 mL/min (by C-G formula based on SCr of 0.57 mg/dL).   Medical History: Past Medical History:  Diagnosis Date   COVID-19 07/2018   Hyperlipidemia    Hypertension    Lung cancer (HCC) 07/2023   Motion sickness    amuesment park rides   Osteopenia 11/01/2015   Pneumonia    april 2025   Psoriasis    Vertigo    none for over 5 yrs   Wears dentures    full upper and lower    Medications:  No history of chronic anticoagulation PTA  Assessment: 63 y.o. female with past medical history of hypertension, hyperlipidemia, and lung cancer who presents to the ED complaining of hemoptysis last night and this morning. CTA of chest shows segmental PE with no evidence of right heart strain. Pharmacy has been consulted to dose and monitor continuous heparin  infusion.  Baseline labs: aPTT pending, INR 0.9, Plts 273, hgb 13.1  Goal of Therapy:  Heparin  level 0.3-0.7 units/ml Monitor platelets by anticoagulation protocol: Yes   Plan:  6/17:  HL @ 0521 = 0.73, elevated - Will decrease drip rate to 1100 units/hr and recheck HL 6 hrs after rate change  Continue to monitor H&H and platelets  Dashawna Delbridge  D 09/24/2023,6:18 AM

## 2023-09-24 NOTE — TOC CM/SW Note (Signed)
 Transition of Care [facility]) - Inpatient Brief Assessment   Patient Details  Name: Tanya Howell MRN: [id number] Date of Birth: 25-Aug-1960  [facility] of [facility]) CM/SW Contact:    Odilia Bennett, LCSW Phone Number: 09/24/2023, 2:51 PM   Clinical Narrative: Patient has orders to discharge home today. CSW reviewed chart. No TOC needs identified. CSW signing off.  Transition of Care Asessment: Insurance and Status: Insurance coverage has been reviewed Patient has primary care physician: Yes Home environment has been reviewed: Single family home Prior level of function:: Not documented Prior/Current Home Services: No current home services Social Drivers of Health Review: SDOH reviewed no interventions necessary Readmission risk has been reviewed: Yes Transition of care needs: no transition of care needs at this time

## 2023-09-24 NOTE — Telephone Encounter (Signed)
 Patient Product/process development scientist completed.    The patient is insured through [employer]. Patient has [employer], may use a copay card, and/or apply for patient assistance if available.    Ran test claim for Eliquis 5 mg and the current 30 day co-pay is $0.00.  Ran test claim for Xarelto 20 mg and the current 30 day co-pay is $0.00.  This test claim was processed through [facility]- copay amounts may vary at other pharmacies due to pharmacy/plan contracts, or as the patient moves through the different stages of their insurance plan.     Roland Earl, CPHT Pharmacy Technician III Certified Patient Advocate [facility] Patient Advocate Team Direct Number: [phone]  Fax: [phone]

## 2023-09-24 NOTE — ED Notes (Signed)
Informed RN bed assigned 

## 2023-09-24 NOTE — Plan of Care (Signed)
  Problem: Education: Goal: Knowledge of General Education information will improve Description Including pain rating scale, medication(s)/side effects and non-pharmacologic comfort measures Outcome: Progressing   Problem: Clinical Measurements: Goal: Ability to maintain clinical measurements within normal limits will improve Outcome: Progressing Goal: Will remain free from infection Outcome: Progressing Goal: Diagnostic test results will improve Outcome: Progressing Goal: Respiratory complications will improve Outcome: Progressing Goal: Cardiovascular complication will be avoided Outcome: Progressing   Problem: Health Behavior/Discharge Planning: Goal: Ability to manage health-related needs will improve Outcome: Progressing   Problem: Activity: Goal: Risk for activity intolerance will decrease Outcome: Progressing   Problem: Nutrition: Goal: Adequate nutrition will be maintained Outcome: Progressing   Problem: Coping: Goal: Level of anxiety will decrease Outcome: Progressing

## 2023-09-25 ENCOUNTER — Other Ambulatory Visit: Payer: Self-pay

## 2023-09-25 ENCOUNTER — Ambulatory Visit
Admission: RE | Admit: 2023-09-25 | Discharge: 2023-09-25 | Disposition: A | Discharge status: Home / Self Care | Source: Ambulatory Visit | Attending: Radiation Oncology | Admitting: Radiation Oncology

## 2023-09-25 DIAGNOSIS — Z5111 Encounter for antineoplastic chemotherapy: Secondary | ICD-10-CM | POA: Diagnosis not present

## 2023-09-25 LAB — RAD ONC ARIA SESSION SUMMARY
Course Elapsed Days: 16
Plan Fractions Treated to Date: 12
Plan Prescribed Dose Per Fraction: 2 Gy
Plan Total Fractions Prescribed: 33
Plan Total Prescribed Dose: 66 Gy
Reference Point Dosage Given to Date: 24 Gy
Reference Point Session Dosage Given: 2 Gy
Session Number: 12

## 2023-09-26 ENCOUNTER — Ambulatory Visit
Admission: RE | Admit: 2023-09-26 | Discharge: 2023-09-26 | Disposition: A | Discharge status: Home / Self Care | Source: Ambulatory Visit | Attending: Radiation Oncology | Admitting: Radiation Oncology

## 2023-09-26 ENCOUNTER — Other Ambulatory Visit: Payer: Self-pay

## 2023-09-26 ENCOUNTER — Inpatient Hospital Stay

## 2023-09-26 DIAGNOSIS — Z5111 Encounter for antineoplastic chemotherapy: Secondary | ICD-10-CM | POA: Diagnosis not present

## 2023-09-26 LAB — RAD ONC ARIA SESSION SUMMARY
Course Elapsed Days: 17
Plan Fractions Treated to Date: 13
Plan Prescribed Dose Per Fraction: 2 Gy
Plan Total Fractions Prescribed: 33
Plan Total Prescribed Dose: 66 Gy
Reference Point Dosage Given to Date: 26 Gy
Reference Point Session Dosage Given: 2 Gy
Session Number: 13

## 2023-09-26 NOTE — Progress Notes (Signed)
 Nutrition  Patient did not stay for nutrition appointment today following radiation.    Rhea Thrun B. Zollie Hipp, CSO, LDN Registered Dietitian [phone]

## 2023-09-27 ENCOUNTER — Ambulatory Visit
Admission: RE | Admit: 2023-09-27 | Discharge: 2023-09-27 | Disposition: A | Discharge status: Home / Self Care | Source: Ambulatory Visit | Attending: Radiation Oncology | Admitting: Radiation Oncology

## 2023-09-27 ENCOUNTER — Other Ambulatory Visit: Payer: Self-pay

## 2023-09-27 DIAGNOSIS — Z5111 Encounter for antineoplastic chemotherapy: Secondary | ICD-10-CM | POA: Diagnosis not present

## 2023-09-27 LAB — RAD ONC ARIA SESSION SUMMARY
Course Elapsed Days: 18
Plan Fractions Treated to Date: 14
Plan Prescribed Dose Per Fraction: 2 Gy
Plan Total Fractions Prescribed: 33
Plan Total Prescribed Dose: 66 Gy
Reference Point Dosage Given to Date: 28 Gy
Reference Point Session Dosage Given: 2 Gy
Session Number: 14

## 2023-09-30 ENCOUNTER — Inpatient Hospital Stay

## 2023-09-30 ENCOUNTER — Inpatient Hospital Stay (HOSPITAL_BASED_OUTPATIENT_CLINIC_OR_DEPARTMENT_OTHER): Admitting: Oncology

## 2023-09-30 ENCOUNTER — Encounter: Payer: Self-pay | Admitting: Oncology

## 2023-09-30 ENCOUNTER — Encounter: Payer: Self-pay | Admitting: *Deleted

## 2023-09-30 ENCOUNTER — Ambulatory Visit
Admission: RE | Admit: 2023-09-30 | Discharge: 2023-09-30 | Disposition: A | Discharge status: Home / Self Care | Source: Ambulatory Visit | Attending: Radiation Oncology | Admitting: Radiation Oncology

## 2023-09-30 ENCOUNTER — Other Ambulatory Visit: Payer: Self-pay

## 2023-09-30 VITALS — BP 104/76 | HR 90 | Resp 18

## 2023-09-30 VITALS — BP 117/70 | HR 98 | Temp 98.4°F | Resp 18 | Wt 166.8 lb

## 2023-09-30 DIAGNOSIS — R042 Hemoptysis: Secondary | ICD-10-CM

## 2023-09-30 DIAGNOSIS — C3491 Malignant neoplasm of unspecified part of right bronchus or lung: Secondary | ICD-10-CM

## 2023-09-30 DIAGNOSIS — E876 Hypokalemia: Secondary | ICD-10-CM | POA: Insufficient documentation

## 2023-09-30 DIAGNOSIS — Z5111 Encounter for antineoplastic chemotherapy: Secondary | ICD-10-CM

## 2023-09-30 DIAGNOSIS — I2693 Single subsegmental pulmonary embolism without acute cor pulmonale: Secondary | ICD-10-CM

## 2023-09-30 DIAGNOSIS — R053 Chronic cough: Secondary | ICD-10-CM

## 2023-09-30 LAB — RAD ONC ARIA SESSION SUMMARY
Course Elapsed Days: 21
Plan Fractions Treated to Date: 15
Plan Prescribed Dose Per Fraction: 2 Gy
Plan Total Fractions Prescribed: 33
Plan Total Prescribed Dose: 66 Gy
Reference Point Dosage Given to Date: 30 Gy
Reference Point Session Dosage Given: 2 Gy
Session Number: 15

## 2023-09-30 LAB — CMP (CANCER CENTER ONLY)
ALT: 24 U/L (ref 0–44)
AST: 17 U/L (ref 15–41)
Albumin: 3.8 g/dL (ref 3.5–5.0)
Alkaline Phosphatase: 56 U/L (ref 38–126)
Anion gap: 8 (ref 5–15)
BUN: 20 mg/dL (ref 8–23)
CO2: 23 mmol/L (ref 22–32)
Calcium: 9 mg/dL (ref 8.9–10.3)
Chloride: 104 mmol/L (ref 98–111)
Creatinine: 0.74 mg/dL (ref 0.44–1.00)
GFR, Estimated: >60 mL/min (ref >60)
Glucose, Bld: 105 mg/dL — ABNORMAL HIGH (ref 70–99)
Potassium: 3.1 mmol/L — ABNORMAL LOW (ref 3.5–5.1)
Sodium: 135 mmol/L (ref 135–145)
Total Bilirubin: 0.7 mg/dL (ref 0.0–1.2)
Total Protein: 6.3 g/dL — ABNORMAL LOW (ref 6.5–8.1)

## 2023-09-30 LAB — CBC WITH DIFFERENTIAL (CANCER CENTER ONLY)
Abs Immature Granulocytes: 0.06 10*3/uL (ref 0.00–0.07)
Basophils Absolute: 0.1 10*3/uL (ref 0.0–0.1)
Basophils Relative: 1 %
Eosinophils Absolute: 0.1 10*3/uL (ref 0.0–0.5)
Eosinophils Relative: 2 %
HCT: 33.9 % — ABNORMAL LOW (ref 36.0–46.0)
Hemoglobin: 11.6 g/dL — ABNORMAL LOW (ref 12.0–15.0)
Immature Granulocytes: 1 %
Lymphocytes Relative: 15 %
Lymphs Abs: 0.9 10*3/uL (ref 0.7–4.0)
MCH: 28 pg (ref 26.0–34.0)
MCHC: 34.2 g/dL (ref 30.0–36.0)
MCV: 81.9 fL (ref 80.0–100.0)
Monocytes Absolute: 0.5 10*3/uL (ref 0.1–1.0)
Monocytes Relative: 9 %
Neutro Abs: 4.4 10*3/uL (ref 1.7–7.7)
Neutrophils Relative %: 72 %
Platelet Count: 214 10*3/uL (ref 150–400)
RBC: 4.14 MIL/uL (ref 3.87–5.11)
RDW: 13.2 % (ref 11.5–15.5)
WBC Count: 6.1 10*3/uL (ref 4.0–10.5)
nRBC: 0 % (ref 0.0–0.2)

## 2023-09-30 MED ORDER — PALONOSETRON HCL INJECTION 0.25 MG/5ML
0.2500 mg | Freq: Once | INTRAVENOUS | Status: AC
  Administered 2023-09-30: 0.25 mg via INTRAVENOUS
  Filled 2023-09-30: qty 5

## 2023-09-30 MED ORDER — DIPHENHYDRAMINE HCL 50 MG/ML IJ SOLN
50.0000 mg | Freq: Once | INTRAMUSCULAR | Status: AC
  Administered 2023-09-30: 50 mg via INTRAVENOUS
  Filled 2023-09-30: qty 1

## 2023-09-30 MED ORDER — SODIUM CHLORIDE 0.9 % IV SOLN
231.6000 mg | Freq: Once | INTRAVENOUS | Status: AC
  Administered 2023-09-30: 230 mg via INTRAVENOUS
  Filled 2023-09-30: qty 23

## 2023-09-30 MED ORDER — SODIUM CHLORIDE 0.9 % IV SOLN
45.0000 mg/m2 | Freq: Once | INTRAVENOUS | Status: AC
  Administered 2023-09-30: 84 mg via INTRAVENOUS
  Filled 2023-09-30: qty 14

## 2023-09-30 MED ORDER — DEXAMETHASONE SODIUM PHOSPHATE 10 MG/ML IJ SOLN
10.0000 mg | Freq: Once | INTRAMUSCULAR | Status: AC
  Administered 2023-09-30: 10 mg via INTRAVENOUS
  Filled 2023-09-30: qty 1

## 2023-09-30 MED ORDER — FAMOTIDINE IN NACL 20-0.9 MG/50ML-% IV SOLN
20.0000 mg | Freq: Once | INTRAVENOUS | Status: AC
  Administered 2023-09-30: 20 mg via INTRAVENOUS
  Filled 2023-09-30: qty 50

## 2023-09-30 MED ORDER — POTASSIUM CHLORIDE CRYS ER 20 MEQ PO TBCR: 20.0000 meq | EXTENDED_RELEASE_TABLET | Freq: Every day | ORAL | 0 refills | Status: DC

## 2023-09-30 MED ORDER — HEPARIN SOD (PORK) LOCK FLUSH 100 UNIT/ML IV SOLN
250.0000 [IU] | Freq: Once | INTRAVENOUS | Status: AC | PRN
  Administered 2023-09-30: 250 [IU]
  Filled 2023-09-30: qty 5

## 2023-09-30 MED ORDER — SODIUM CHLORIDE 0.9 % IV SOLN
INTRAVENOUS | Status: DC
  Filled 2023-09-30: qty 250

## 2023-09-30 NOTE — Assessment & Plan Note (Signed)
 Anticoagulation with Eliquis  10mg  BID x 7 days followed by 5mg  BID.

## 2023-09-30 NOTE — Assessment & Plan Note (Signed)
 Chemotherapy treatment as planned above

## 2023-09-30 NOTE — Assessment & Plan Note (Signed)
 Due to pulmonary embolism.  Resolved.

## 2023-09-30 NOTE — Progress Notes (Signed)
 Hematology/Oncology Progress note [phone] [phone]       CHIEF COMPLAINTS/PURPOSE OF CONSULTATION:  Stage III lung adenocarcinoam  ASSESSMENT & PLAN:   Cancer Staging  Primary lung adenocarcinoma, right [facility]) Staging form: Lung, AJCC V9 - Clinical stage from 08/15/2023: Tanya Howell, cM0 - Signed by Babara Call, MD on 08/15/2023   Primary lung adenocarcinoma, right [facility]) Images and pathology results were reviewed with the patient. cT2 N2 M0, Stage III right lung adenocarcinoma.  NGS showed TPS 5% CPS 10  KRAS G12C, TMB 12.6  Labs are reviewed and discussed with patient. Proceed with carboplatin  AUC 2, Taxol  45mg /m2   Hypocalcemia Recommend calcium  supplementation 600mg  daily  Hemoptysis Due to pulmonary embolism.  Resolved.   Encounter for antineoplastic chemotherapy Chemotherapy treatment as planned above  Cough She has tried over the counter cough medication with no relief.  Guaifenesin  Codeine  cough suppressant PRN  Pulmonary embolism (HCC) Anticoagulation with Eliquis  10mg  BID x 7 days followed by 5mg  BID.    Hypokalemia Potassium chloride 20meq daily   No orders of the defined types were placed in this encounter.  Follow-up 1 week All questions were answered. The patient knows to call the clinic with any problems, questions or concerns.  Call Babara, MD, PhD [facility] Hematology Oncology 09/30/2023    HISTORY OF PRESENTING ILLNESS:  Tanya Howell 63 y.o. female presents to establish care for lung cancer  Oncology History  Primary lung adenocarcinoma, right (HCC)  08/03/2023 Imaging   CT angiogram chest PE protocol showed  1. 3.5 x 2.6 cm right hilar mass, with obstruction of the bronchus intermedius, right middle lobe bronchus, and portions of the right lower lobe bronchus as above, highly concerning for neoplasm. Bronchoscopy is recommended for further evaluation. 2. Subcarinal adenopathy concerning for metastatic disease. 3.  Dense medial segment consolidation within the right middle lobe with associated volume loss, consistent with atelectasis. 4. Prominent right lower lobe bronchiectasis, with fluid-filled bronchi and patchy right basilar consolidation consistent with combination of airspace disease and atelectasis. 5. No evidence of pulmonary embolus. 6. Aortic Atherosclerosis (ICD10-I70.0). Coronary artery atherosclerosis.   08/06/2023 Initial Diagnosis   Primary lung adenocarcinoma, right (HCC)  08/03/2023, patient presented to emergency room for evaluation of hemoptysis. Patient had pneumonia in March 2025, she continues to have a lingering cough for the past months despite being treated with doxycycline  and prednisone . She coughed up small amount of bright red blood on 08/03/2023.  Denies any shortness of breath, chest pain unintentional weight loss headache.  She felt feverish with chills 2 nights ago. Patient is a former smoker, quit smoking in 2011. Patient denies being on any antiplatelet or anticoagulation agents.  08/08/2023 She underwent biopsy via bronchoscopy.  FINAL MICROSCOPIC DIAGNOSIS:  A. LUNG, RLL, BRUSHING:  - Adenocarcinoma   B. LUNG, RLL, FINE NEEDLE ASPIRATION:  - Adenocarcinoma   Immunohistochemical stains were performed to characterize the tumor cells. The cells are negative for TTF-1, Napsin A, p40, synaptophysin, Chromogranin A, and CD56.  Mucicarmine stain highlights intracytoplasmic mucin. Ki67 proliferation index is approximately 30%. The overall findings are supportive of the diagnosis of adenocarcinoma.   C. LYMPH NODE, 7, FINE NEEDLE ASPIRATION:  - Adenocarcinoma  - Lymphoid tissue present   D. LYMPH NODE, 4R, FINE NEEDLE ASPIRATION:  - No malignant cells identified  - Lymphoid tissue present   E. LYMPH NODE, 11R, FINE NEEDLE ASPIRATION:  - No malignant cells identified  - Lymphoid tissue present   F. LUNG, RLL, LAVAGE:  FINAL MICROSCOPIC DIAGNOSIS:  - Atypical  cells present    08/15/2023 Cancer Staging   Staging form: Lung, AJCC V9 - Clinical stage from 08/15/2023: Tanya Howell, cM0 - Signed by Babara Call, MD on 08/15/2023 Stage prefix: Initial diagnosis Method of lymph node assessment: Clinical   09/09/2023 -  Chemotherapy   Patient is on Treatment Plan : LUNG Carboplatin  + Paclitaxel  + XRT q7d      She has no new complaints except intermittent cough.  She has tried cough suppressant, not very effective and caused constipation. She has not used much.  Hemoptysis last week, seen by pulmonology. She was hospitalized for further work up which revealed acute segmental lingula PE. Patient was started on anticoagulation with Eliquis .  Today she presents for post hospitalization evaluation and pre chemotherapy evaluation.  She reports feeling well. No additional episodes of hemoptysis.    MEDICAL HISTORY:  Past Medical History:  Diagnosis Date   COVID-19 07/2018   Hyperlipidemia    Hypertension    Lung cancer (HCC) 07/2023   Motion sickness    amuesment park rides   Osteopenia 11/01/2015   Pneumonia    april 2025   Psoriasis    Vertigo    none for over 5 yrs   Wears dentures    full upper and lower    SURGICAL HISTORY: Past Surgical History:  Procedure Laterality Date   BRONCHIAL BIOPSY  08/08/2023   Procedure: BRONCHOSCOPY, WITH BIOPSY;  Surgeon: Malka Domino, MD;  Location: [facility] END[facility];  Service: Pulmonary;;   BRONCHIAL BRUSHINGS  08/08/2023   Procedure: BRONCHOSCOPY, WITH BRUSH BIOPSY;  Surgeon: Malka Domino, MD;  Location: [facility] ENDOSCOPY;  Service: Pulmonary;;   BRONCHIAL NEEDLE ASPIRATION BIOPSY  08/08/2023   Procedure: BRONCHOSCOPY, WITH NEEDLE ASPIRATION BIOPSY;  Surgeon: Malka Domino, MD;  Location: [facility] ENDOSCOPY;  Service: Pulmonary;;   CHOLECYSTECTOMY  1990   COLONOSCOPY WITH PROPOFOL  N/A 07/15/2020   Procedure: COLONOSCOPY WITH PROPOFOL ;  Surgeon: Jinny Carmine, MD;  Location: [facility];  Service: Endoscopy;   Laterality: N/A;  priority 4   ENDOBRONCHIAL ULTRASOUND Bilateral 08/08/2023   Procedure: ENDOBRONCHIAL ULTRASOUND (EBUS);  Surgeon: Malka Domino, MD;  Location: [facility] ENDOSCOPY;  Service: Pulmonary;  Laterality: Bilateral;   IR IMAGING GUIDED PORT INSERTION  08/29/2023   TUBAL LIGATION      SOCIAL HISTORY: Social History   Socioeconomic History   Marital status: Married    Spouse name: Oneil   Number of children: 1   Years of education: Not on file   Highest education level: High school graduate  Occupational History   Not on file  Tobacco Use   Smoking status: Former    Current packs/day: 0.00    Average packs/day: 1 pack/day for 28.0 years (28.0 ttl pk-yrs)    Types: Cigarettes    Start date: 76    Quit date: 2011    Years since quitting: 14.4   Smokeless tobacco: Never   Tobacco comments:    Started smoking around 63 yrs old.    Smoked 1PPD at her heaviest.    Quit smoking in 2011- leda 08/07/2023  Vaping Use   Vaping status: Former  Substance and Sexual Activity   Alcohol use: No    Alcohol/week: 0.0 standard drinks of alcohol    Comment: rare   Drug use: No   Sexual activity: Yes    Birth control/protection: Surgical  Other Topics Concern   Not on file  Social History Narrative   Not on file  Social Drivers of Corporate investment banker Strain: Low Risk  (05/06/2018)   Overall Financial Resource Strain (CARDIA)    Difficulty of Paying Living Expenses: Not hard at all  Food Insecurity: No Food Insecurity (09/24/2023)   Hunger Vital Sign    Worried About Running Out of Food in the Last Year: Never true    Ran Out of Food in the Last Year: Never true  Transportation Needs: No Transportation Needs (09/24/2023)   PRAPARE - Administrator, Civil Service (Medical): No    Lack of Transportation (Non-Medical): No  Physical Activity: Sufficiently Active (05/06/2018)   Exercise Vital Sign    Days of Exercise per Week: 7 days    Minutes of Exercise per  Session: 30 min  Stress: No Stress Concern Present (05/06/2018)   [employer] of Occupational Health - Occupational Stress Questionnaire    Feeling of Stress : Not at all  Social Connections: Moderately Isolated (09/24/2023)   Social Connection and Isolation Panel    Frequency of Communication with Friends and Family: More than three times a week    Frequency of Social Gatherings with Friends and Family: Twice a week    Attends Religious Services: Never    Database administrator or Organizations: No    Attends Banker Meetings: Never    Marital Status: Married  Catering manager Violence: Not At Risk (09/24/2023)   Humiliation, Afraid, Rape, and Kick questionnaire    Fear of Current or Ex-Partner: No    Emotionally Abused: No    Physically Abused: No    Sexually Abused: No    FAMILY HISTORY: Family History  Problem Relation Age of Onset   Colon cancer Brother        21    Breast cancer Mother    Breast cancer Maternal Aunt    Pneumonia Father    Diabetes Maternal Grandmother     ALLERGIES:  is allergic to lisinopril .  MEDICATIONS:  Current Outpatient Medications  Medication Sig Dispense Refill   amoxicillin -clavulanate (AUGMENTIN ) 875-125 MG tablet Take 1 tablet by mouth 2 (two) times daily for 5 days. 10 tablet 0   APIXABAN  (ELIQUIS ) VTE STARTER PACK (10MG  AND 5MG ) Take as directed on package: start with two-5mg  tablets twice daily for 7 days. On day 8, switch to one-5mg  tablet twice daily. 74 each 0   atorvastatin  (LIPITOR) 10 MG tablet TAKE 1 TABLET BY MOUTH AT BEDTIME 90 tablet 1   benzonatate  (TESSALON  PERLES) 100 MG capsule Take 1 capsule (100 mg total) by mouth 2 (two) times daily as needed for cough. 30 capsule 1   dexamethasone  (DECADRON ) 4 MG tablet Take 2 tablets daily for 2 days, start the day after chemotherapy. Take with food. 30 tablet 1   guaifenesin  (ROBITUSSIN) 100 MG/5ML syrup Take 200 mg by mouth 3 (three) times daily as needed for cough.      guaiFENesin -codeine  100-10 MG/5ML syrup Take 5 mLs by mouth every 6 (six) hours as needed for cough. 118 mL 0   levalbuterol  (XOPENEX  HFA) 45 MCG/ACT inhaler Inhale 2 puffs into the lungs every 6 (six) hours as needed for wheezing. 15 g 3   lidocaine -prilocaine  (EMLA ) cream Apply to affected area once 30 g 3   losartan  (COZAAR ) 25 MG tablet Take 0.5 tablets (12.5 mg total) by mouth daily. 45 tablet 1   meclizine  (ANTIVERT ) 25 MG tablet Take 1 tablet (25 mg total) by mouth 3 (three) times daily as needed for  dizziness. 30 tablet 3   ondansetron  (ZOFRAN ) 8 MG tablet Take 1 tablet (8 mg total) by mouth every 8 (eight) hours as needed for nausea or vomiting. Start on the third day after chemotherapy. 30 tablet 1   potassium chloride SA (KLOR-CON M) 20 MEQ tablet Take 1 tablet (20 mEq total) by mouth daily. 30 tablet 0   prochlorperazine  (COMPAZINE ) 10 MG tablet Take 1 tablet (10 mg total) by mouth every 6 (six) hours as needed for nausea or vomiting. 30 tablet 1   sucralfate  (CARAFATE ) 1 g tablet Take 1 tablet (1 g total) by mouth 3 (three) times daily before meals. 90 tablet 1   triamcinolone  ointment (KENALOG ) 0.5 % Apply 1 Application topically 2 (two) times daily. (Patient not taking: Reported on 09/30/2023) 30 g 6   No current facility-administered medications for this visit.   Facility-Administered Medications Ordered in Other Visits  Medication Dose Route Frequency Provider Last Rate Last Admin   0.9 %  sodium chloride  infusion   Intravenous Continuous Babara Call, MD 10 mL/hr at 09/30/23 0956 New Bag at 09/30/23 0956   CARBOplatin  (PARAPLATIN ) 230 mg in sodium chloride  0.9 % 100 mL chemo infusion  230 mg Intravenous Once Kamal Jurgens, MD       famotidine  (PEPCID ) IVPB 20 mg premix  20 mg Intravenous Once Viktor Philipp, MD       heparin  lock flush 100 unit/mL  250 Units Intracatheter Once PRN Babara Call, MD       PACLitaxel  (TAXOL ) 84 mg in sodium chloride  0.9 % 250 mL chemo infusion (</= 80mg /m2)  45  mg/m2 (Treatment Plan Recorded) Intravenous Once Babara Call, MD        Review of Systems  Constitutional:  Negative for appetite change, chills, fatigue and fever.  HENT:   Negative for hearing loss and voice change.   Eyes:  Negative for eye problems.  Respiratory:  Positive for cough. Negative for chest tightness and hemoptysis.   Cardiovascular:  Negative for chest pain.  Gastrointestinal:  Negative for abdominal distention, abdominal pain and blood in stool.  Endocrine: Negative for hot flashes.  Genitourinary:  Negative for difficulty urinating and frequency.   Musculoskeletal:  Negative for arthralgias.  Skin:  Negative for itching and rash.  Neurological:  Negative for extremity weakness.  Hematological:  Negative for adenopathy.  Psychiatric/Behavioral:  Negative for confusion.      PHYSICAL EXAMINATION: ECOG PERFORMANCE STATUS: 0 - Asymptomatic  Vitals:   09/30/23 0925  BP: 117/70  Pulse: 98  Resp: 18  Temp: 98.4 F (36.9 C)  SpO2: 99%   Filed Weights   09/30/23 0925  Weight: 166 lb 12.8 oz (75.7 kg)    Physical Exam Constitutional:      General: She is not in acute distress.    Appearance: She is not diaphoretic.  HENT:     Head: Normocephalic and atraumatic.   Eyes:     General: No scleral icterus.   Cardiovascular:     Rate and Rhythm: Normal rate and regular rhythm.     Heart sounds: No murmur heard. Pulmonary:     Effort: Pulmonary effort is normal. No respiratory distress.     Breath sounds: No wheezing.  Abdominal:     General: There is no distension.     Palpations: Abdomen is soft.     Tenderness: There is no abdominal tenderness.   Musculoskeletal:        General: Normal range of motion.  Cervical back: Normal range of motion and neck supple.   Skin:    General: Skin is warm and dry.     Findings: No erythema.   Neurological:     Mental Status: She is alert and oriented to person, place, and time. Mental status is at baseline.      Motor: No abnormal muscle tone.   Psychiatric:        Mood and Affect: Affect normal.      LABORATORY DATA:  I have reviewed the data as listed    Latest Ref Rng & Units 09/30/2023    9:16 AM 09/24/2023    5:21 AM 09/23/2023    4:51 PM  CBC  WBC 4.0 - 10.5 K/uL 6.1  3.9  3.8   Hemoglobin 12.0 - 15.0 g/dL 88.3  88.2  86.8   Hematocrit 36.0 - 46.0 % 33.9  35.1  39.0   Platelets 150 - 400 K/uL 214  264  273       Latest Ref Rng & Units 09/30/2023    9:16 AM 09/24/2023    5:21 AM 09/23/2023    4:51 PM  CMP  Glucose 70 - 99 mg/dL 894  875  844   BUN 8 - 23 mg/dL 20  18  16    Creatinine 0.44 - 1.00 mg/dL 9.25  9.42  9.29   Sodium 135 - 145 mmol/L 135  138  137   Potassium 3.5 - 5.1 mmol/L 3.1  3.9  4.1   Chloride 98 - 111 mmol/L 104  108  106   CO2 22 - 32 mmol/L 23  23  19    Calcium  8.9 - 10.3 mg/dL 9.0  9.1  9.3   Total Protein 6.5 - 8.1 g/dL 6.3     Total Bilirubin 0.0 - 1.2 mg/dL 0.7     Alkaline Phos 38 - 126 U/L 56     AST 15 - 41 U/L 17     ALT 0 - 44 U/L 24        RADIOGRAPHIC STUDIES: I have personally reviewed the radiological images as listed and agreed with the findings in the report. CT Angio Chest PE W and/or Wo Contrast Result Date: 09/23/2023 CLINICAL DATA:  Hemoptysis.  Lung cancer.  * Tracking Code: BO * EXAM: CT ANGIOGRAPHY CHEST WITH CONTRAST TECHNIQUE: Multidetector CT imaging of the chest was performed using the standard protocol during bolus administration of intravenous contrast. Multiplanar CT image reconstructions and MIPs were obtained to evaluate the vascular anatomy. RADIATION DOSE REDUCTION: This exam was performed according to the departmental dose-optimization program which includes automated exposure control, adjustment of the mA and/or kV according to patient size and/or use of iterative reconstruction technique. CONTRAST:  75mL OMNIPAQUE  IOHEXOL  350 MG/ML SOLN COMPARISON:  08/03/2023. FINDINGS: Cardiovascular: There are filling defects in the  lingular segmental bronchi (6/114-123). Atherosclerotic calcification of the aorta and circumflex coronary artery. Heart is enlarged. No pericardial effusion. Right IJ Port-A-Cath terminates at the SVC RA junction. Mediastinum/Nodes: Subcarinal and right hilar adenopathy measures 2.5 cm and 3.2 cm, respectively, increased in size from 2.0 cm and 2.6 cm, respectively, on 08/03/2023. No left hilar or axillary adenopathy. Esophagus is grossly unremarkable. Lungs/Pleura: Right hilar adenopathy markedly narrows the right middle and right lower lobe bronchi, creating endobronchial debris and airspace consolidation in the right middle and right lower lobes, increased from 07/14/2023. No pleural fluid. Airway is otherwise unremarkable. Upper Abdomen: Cholecystectomy. Visualized portions of the liver, adrenal glands, kidneys, spleen, pancreas,  stomach and bowel are otherwise grossly unremarkable. No upper abdominal adenopathy. Musculoskeletal: Degenerative changes in the spine. Review of the MIP images confirms the above findings. IMPRESSION: 1. Segmental pulmonary emboli in the lingula. Critical Value/emergent results were called by telephone at the time of interpretation on 09/23/2023 at 6:49 pm to provider JENISE MENSHEW, who verbally acknowledged these results. 2. Enlarging right hilar and subcarinal nodal masses, compatible with biopsy-proven adenocarcinoma. Associated marked narrowing of the right middle and right lower lobe bronchi with endobronchial debris and postobstructive airspace consolidation in the right middle and right lower lobes. 3. Aortic atherosclerosis (ICD10-I70.0). Circumflex coronary artery calcification. Electronically Signed   By: Newell Eke M.D.   On: 09/23/2023 18:50

## 2023-09-30 NOTE — Patient Instructions (Signed)
 [facility] - A DEPT OF [facility]  Discharge Instructions: PICK UP POTASSIUM PILLS FROM PHARMACY  Thank you for choosing [facility] to provide your oncology and hematology care.  If you have a lab appointment with the Cancer Center, please go directly to the Cancer Center and check in at the registration area.  Wear comfortable clothing and clothing appropriate for easy access to any Portacath or PICC line.   We strive to give you quality time with your provider. You may need to reschedule your appointment if you arrive late (15 or more minutes).  Arriving late affects you and other patients whose appointments are after yours.  Also, if you miss three or more appointments without notifying the office, you may be dismissed from the clinic at the provider's discretion.      For prescription refill requests, have your pharmacy contact our office and allow 72 hours for refills to be completed.    To help prevent nausea and vomiting after your treatment, we encourage you to take your nausea medication as directed.  BELOW ARE SYMPTOMS THAT SHOULD BE REPORTED IMMEDIATELY: *FEVER GREATER THAN 100.4 F (38 C) OR HIGHER *CHILLS OR SWEATING *NAUSEA AND VOMITING THAT IS NOT CONTROLLED WITH YOUR NAUSEA MEDICATION *UNUSUAL SHORTNESS OF BREATH *UNUSUAL BRUISING OR BLEEDING *URINARY PROBLEMS (pain or burning when urinating, or frequent urination) *BOWEL PROBLEMS (unusual diarrhea, constipation, pain near the anus) TENDERNESS IN MOUTH AND THROAT WITH OR WITHOUT PRESENCE OF ULCERS (sore throat, sores in mouth, or a toothache) UNUSUAL RASH, SWELLING OR PAIN  UNUSUAL VAGINAL DISCHARGE OR ITCHING   Items with * indicate a potential emergency and should be followed up as soon as possible or go to the Emergency Department if any problems should occur.  Please show the CHEMOTHERAPY ALERT CARD or IMMUNOTHERAPY ALERT CARD at check-in to the Emergency Department  and triage nurse.  Should you have questions after your visit or need to cancel or reschedule your appointment, please contact [facility] - A DEPT OF [facility]  [phone] and follow the prompts.  Office hours are 8:00 a.m. to 4:30 p.m. Monday - Friday. Please note that voicemails left after 4:00 p.m. may not be returned until the following business day.  We are closed weekends and major holidays. You have access to a nurse at all times for urgent questions. Please call the main number to the clinic [phone] and follow the prompts.  For any non-urgent questions, you may also contact your provider using MyChart. We now offer e-Visits for anyone 35 and older to request care online for non-urgent symptoms. For details visit [email or web address].   Also download the MyChart app! Go to the app store, search MyChart, open the app, select [place], and log in with your MyChart username and password.

## 2023-09-30 NOTE — Assessment & Plan Note (Signed)
 Recommend calcium  supplementation 600mg  daily

## 2023-09-30 NOTE — Assessment & Plan Note (Signed)
Potassium chloride daily

## 2023-09-30 NOTE — Assessment & Plan Note (Signed)
 She has tried over the counter cough medication with no relief.  Guaifenesin  Codeine  cough suppressant PRN

## 2023-09-30 NOTE — Assessment & Plan Note (Signed)
 Images and pathology results were reviewed with the patient. cT2 N2 M0, Stage III right lung adenocarcinoma.  NGS showed TPS 5% CPS 10  KRAS G12C, TMB 12.6  Labs are reviewed and discussed with patient. Proceed with carboplatin AUC 2, Taxol 45mg /m2

## 2023-10-01 ENCOUNTER — Ambulatory Visit
Admission: RE | Admit: 2023-10-01 | Discharge: 2023-10-01 | Disposition: A | Discharge status: Home / Self Care | Source: Ambulatory Visit | Attending: Radiation Oncology | Admitting: Radiation Oncology

## 2023-10-01 ENCOUNTER — Other Ambulatory Visit: Payer: Self-pay

## 2023-10-01 ENCOUNTER — Encounter: Payer: Self-pay | Admitting: Oncology

## 2023-10-01 DIAGNOSIS — Z5111 Encounter for antineoplastic chemotherapy: Secondary | ICD-10-CM | POA: Diagnosis not present

## 2023-10-01 LAB — RAD ONC ARIA SESSION SUMMARY
Course Elapsed Days: 22
Plan Fractions Treated to Date: 16
Plan Prescribed Dose Per Fraction: 2 Gy
Plan Total Fractions Prescribed: 33
Plan Total Prescribed Dose: 66 Gy
Reference Point Dosage Given to Date: 32 Gy
Reference Point Session Dosage Given: 2 Gy
Session Number: 16

## 2023-10-02 ENCOUNTER — Other Ambulatory Visit: Payer: Self-pay | Admitting: Family Medicine

## 2023-10-02 ENCOUNTER — Other Ambulatory Visit: Payer: Self-pay

## 2023-10-02 ENCOUNTER — Ambulatory Visit (INDEPENDENT_AMBULATORY_CARE_PROVIDER_SITE_OTHER): Admitting: Pulmonary Disease

## 2023-10-02 ENCOUNTER — Ambulatory Visit
Admission: RE | Admit: 2023-10-02 | Discharge: 2023-10-02 | Disposition: A | Discharge status: Home / Self Care | Source: Ambulatory Visit | Attending: Radiation Oncology | Admitting: Radiation Oncology

## 2023-10-02 ENCOUNTER — Encounter: Payer: Self-pay | Admitting: Pulmonary Disease

## 2023-10-02 VITALS — BP 120/72 | HR 96 | Temp 96.8°F | Ht 64.0 in | Wt 164.8 lb

## 2023-10-02 DIAGNOSIS — Z5111 Encounter for antineoplastic chemotherapy: Secondary | ICD-10-CM | POA: Diagnosis not present

## 2023-10-02 DIAGNOSIS — I2693 Single subsegmental pulmonary embolism without acute cor pulmonale: Secondary | ICD-10-CM | POA: Diagnosis not present

## 2023-10-02 LAB — RAD ONC ARIA SESSION SUMMARY
Course Elapsed Days: 23
Plan Fractions Treated to Date: 17
Plan Prescribed Dose Per Fraction: 2 Gy
Plan Total Fractions Prescribed: 33
Plan Total Prescribed Dose: 66 Gy
Reference Point Dosage Given to Date: 34 Gy
Reference Point Session Dosage Given: 2 Gy
Session Number: 17

## 2023-10-02 NOTE — Progress Notes (Signed)
 Synopsis: Referred in by Vicci Duwaine SQUIBB, DO   Subjective:   PATIENT ID: Tanya Howell GENDER: female DOB: 1960-09-26, MRN: [id number]  Chief Complaint  Patient presents with   Follow-up    Cough with clear sputum. Little wheezing.     HPI Tanya Howell is a pleasant 63 year old female patient with a past medical history of hypertension, hyperlipidemia and history of tobacco use quit in 2011 presenting for evaluation of right hilar mass.  She started having cough with sputum production back in March 2025 and was prescribed a course of antibiotics with prednisone  however the coughing spell persisted and had 1 episode of hemoptysis which prompted her presentation to the ED on 0/26.  She underwent a chest x-ray that showed a right base airspace disease that prompted a CTA of the chest.  CTA of the chest 0/26/2025 showed 3.5 cm right hilar mass involving the bronchus intermedius in the right middle lobe bronchus and portions of the right lower lobe bronchi.  Highly suspicious for primary lung malignancy.  Besides a cough and 1 episode of hemoptysis she is asymptomatic denies any weight loss or loss of appetite.  Family history -she denies any lung cancer in the family.  Social history -ex-smoker quit in 2011 has 30 pack/year.  OV 10/02/2023 - Tanya Howell is here to follow up on a recent admission for hemoptysis and new dx of PE. 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 Course was complicated by hemoptysis which led to her presentation to the ED, CTA chest with segmental PE. Received TXA nebs and started on AC.   ROS All systems were reviewed and are negative except for the above.  Objective:   Vitals:   10/02/23 0918  BP: 120/72  Pulse: 96  Temp: (!) 96.8 F (36 C)  SpO2: 96%  Weight: 164 lb 12.8 oz (74.8 kg)  Height: 5' 4 (1.626 m)   96% on  RA BMI  Readings from Last 3 Encounters:  10/02/23 28.29 kg/m  09/30/23 28.63 kg/m  09/23/23 28.84 kg/m   Wt Readings from Last 3 Encounters:  10/02/23 164 lb 12.8 oz (74.8 kg)  09/30/23 166 lb 12.8 oz (75.7 kg)  09/23/23 168 lb (76.2 kg)    Physical Exam GEN: NAD, Healthy Appearing HEENT: Supple Neck, Reactive Pupils, EOMI  CVS: Normal S1, Normal S2, RRR, No murmurs or ES appreciated  Lungs: Rales appreciated bilaterally.  Abdomen: Soft, non tender, non distended, + BS  Extremities: Warm and well perfused, No edema  Skin: No suspicious lesions appreciated  Psych: Normal Affect  Ancillary Information   CBC    Component Value Date/Time   WBC 6.1 09/30/2023 0916   WBC 3.9 (L) 09/24/2023 0521   RBC 4.14 09/30/2023 0916   HGB 11.6 (L) 09/30/2023 0916   HGB 13.6 11/13/2022 1553   HCT 33.9 (L) 09/30/2023 0916   HCT 41.2 11/13/2022 1553   PLT 214 09/30/2023 0916   PLT 225 11/13/2022 1553   MCV 81.9 09/30/2023 0916   MCV 85 11/13/2022 1553   MCH 28.0 09/30/2023 0916   MCHC 34.2 09/30/2023 0916   RDW 13.2 09/30/2023 0916   RDW 13.0 11/13/2022 1553   LYMPHSABS 0.9 09/30/2023 0916   LYMPHSABS 1.7 11/13/2022 1553   MONOABS 0.5 09/30/2023 0916   EOSABS 0.1 09/30/2023 0916   EOSABS 0.1 11/13/2022 1553   BASOSABS 0.1 09/30/2023 0916   BASOSABS  0.1 11/13/2022 1553   Labs and imaging were reviewed     No data to display           Assessment & Plan:  Tanya Howell is a pleasant 63 year old female patient with a past medical history of hypertension, hyperlipidemia and history of tobacco use quit in 2011 presenting for evaluation of right hilar mass.  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 #Pulmonary embolism, segmental - completed loading with eliquis  and currently on 5mg  BID.   RTC 3 months.   I spent 20 minutes caring for this patient today, including preparing to see the patient, obtaining a medical history , reviewing a  separately obtained history, performing a medically appropriate examination and/or evaluation, counseling and educating the patient/family/caregiver, ordering medications, tests, or procedures, documenting clinical information in the electronic health record, and independently interpreting results (not separately reported/billed) and communicating results to the patient/family/caregiver  Darrin Barn, MD [facility] 10/02/2023 9:22 AM

## 2023-10-03 ENCOUNTER — Other Ambulatory Visit: Payer: Self-pay

## 2023-10-03 ENCOUNTER — Ambulatory Visit
Admission: RE | Admit: 2023-10-03 | Discharge: 2023-10-03 | Disposition: A | Discharge status: Home / Self Care | Source: Ambulatory Visit | Attending: Radiation Oncology | Admitting: Radiation Oncology

## 2023-10-03 DIAGNOSIS — Z5111 Encounter for antineoplastic chemotherapy: Secondary | ICD-10-CM | POA: Diagnosis not present

## 2023-10-03 LAB — RAD ONC ARIA SESSION SUMMARY
Course Elapsed Days: 24
Plan Fractions Treated to Date: 18
Plan Prescribed Dose Per Fraction: 2 Gy
Plan Total Fractions Prescribed: 33
Plan Total Prescribed Dose: 66 Gy
Reference Point Dosage Given to Date: 36 Gy
Reference Point Session Dosage Given: 2 Gy
Session Number: 18

## 2023-10-03 NOTE — Telephone Encounter (Signed)
 Requested Prescriptions  Pending Prescriptions Disp Refills   losartan  (COZAAR ) 25 MG tablet [Pharmacy Med Name: Losartan  Potassium 25 MG Oral Tablet] 45 tablet 0    Sig: Take 1/2 (one-half) tablet by mouth once daily     Cardiovascular:  Angiotensin Receptor Blockers Failed - 10/03/2023  2:28 PM      Failed - K in normal range and within 180 days    Potassium  Date Value Ref Range Status  09/30/2023 3.1 (L) 3.5 - 5.1 mmol/L Final         Passed - Cr in normal range and within 180 days    Creatinine  Date Value Ref Range Status  09/30/2023 0.74 0.44 - 1.00 mg/dL Final   Creat  Date Value Ref Range Status  11/11/2019 0.91 0.50 - 1.05 mg/dL Final    Comment:    For patients >36 years of age, the reference limit for Creatinine is approximately 13% higher for people identified as African-American. SABRA Amy - Patient is not pregnant      Passed - Last BP in normal range    BP Readings from Last 1 Encounters:  10/02/23 120/72         Passed - Valid encounter within last 6 months    Recent Outpatient Visits           3 months ago Pneumonia of right lower lobe due to infectious organism   [place] [facility] [place], Megan P, DO   3 months ago Primary hypertension   [place] [facility] [place], [place], DO

## 2023-10-04 ENCOUNTER — Other Ambulatory Visit: Payer: Self-pay

## 2023-10-04 ENCOUNTER — Ambulatory Visit
Admission: RE | Admit: 2023-10-04 | Discharge: 2023-10-04 | Disposition: A | Discharge status: Home / Self Care | Source: Ambulatory Visit | Attending: Radiation Oncology | Admitting: Radiation Oncology

## 2023-10-04 DIAGNOSIS — Z5111 Encounter for antineoplastic chemotherapy: Secondary | ICD-10-CM | POA: Diagnosis not present

## 2023-10-04 LAB — RAD ONC ARIA SESSION SUMMARY
Course Elapsed Days: 25
Plan Fractions Treated to Date: 19
Plan Prescribed Dose Per Fraction: 2 Gy
Plan Total Fractions Prescribed: 33
Plan Total Prescribed Dose: 66 Gy
Reference Point Dosage Given to Date: 38 Gy
Reference Point Session Dosage Given: 2 Gy
Session Number: 19

## 2023-10-07 ENCOUNTER — Inpatient Hospital Stay

## 2023-10-07 ENCOUNTER — Ambulatory Visit
Admission: RE | Admit: 2023-10-07 | Discharge: 2023-10-07 | Disposition: A | Discharge status: Home / Self Care | Source: Ambulatory Visit | Attending: Radiation Oncology | Admitting: Radiation Oncology

## 2023-10-07 ENCOUNTER — Other Ambulatory Visit: Payer: Self-pay

## 2023-10-07 ENCOUNTER — Inpatient Hospital Stay (HOSPITAL_BASED_OUTPATIENT_CLINIC_OR_DEPARTMENT_OTHER): Admitting: Oncology

## 2023-10-07 ENCOUNTER — Encounter: Payer: Self-pay | Admitting: Oncology

## 2023-10-07 VITALS — BP 110/60 | HR 100 | Temp 99.1°F | Resp 18 | Ht 64.0 in | Wt 167.0 lb

## 2023-10-07 VITALS — BP 113/71 | HR 92

## 2023-10-07 DIAGNOSIS — C3491 Malignant neoplasm of unspecified part of right bronchus or lung: Secondary | ICD-10-CM | POA: Diagnosis not present

## 2023-10-07 DIAGNOSIS — R053 Chronic cough: Secondary | ICD-10-CM

## 2023-10-07 DIAGNOSIS — E876 Hypokalemia: Secondary | ICD-10-CM

## 2023-10-07 DIAGNOSIS — Z5111 Encounter for antineoplastic chemotherapy: Secondary | ICD-10-CM | POA: Diagnosis not present

## 2023-10-07 DIAGNOSIS — I2693 Single subsegmental pulmonary embolism without acute cor pulmonale: Secondary | ICD-10-CM

## 2023-10-07 LAB — CBC WITH DIFFERENTIAL (CANCER CENTER ONLY)
Abs Immature Granulocytes: 0.03 10*3/uL (ref 0.00–0.07)
Basophils Absolute: 0.1 10*3/uL (ref 0.0–0.1)
Basophils Relative: 1 %
Eosinophils Absolute: 0.1 10*3/uL (ref 0.0–0.5)
Eosinophils Relative: 2 %
HCT: 33.2 % — ABNORMAL LOW (ref 36.0–46.0)
Hemoglobin: 11.2 g/dL — ABNORMAL LOW (ref 12.0–15.0)
Immature Granulocytes: 1 %
Lymphocytes Relative: 21 %
Lymphs Abs: 1 10*3/uL (ref 0.7–4.0)
MCH: 27.9 pg (ref 26.0–34.0)
MCHC: 33.7 g/dL (ref 30.0–36.0)
MCV: 82.6 fL (ref 80.0–100.0)
Monocytes Absolute: 0.3 10*3/uL (ref 0.1–1.0)
Monocytes Relative: 7 %
Neutro Abs: 3.1 10*3/uL (ref 1.7–7.7)
Neutrophils Relative %: 68 %
Platelet Count: 120 10*3/uL — ABNORMAL LOW (ref 150–400)
RBC: 4.02 MIL/uL (ref 3.87–5.11)
RDW: 13.5 % (ref 11.5–15.5)
WBC Count: 4.6 10*3/uL (ref 4.0–10.5)
nRBC: 0 % (ref 0.0–0.2)

## 2023-10-07 LAB — CMP (CANCER CENTER ONLY)
ALT: 24 U/L (ref 0–44)
AST: 19 U/L (ref 15–41)
Albumin: 3.6 g/dL (ref 3.5–5.0)
Alkaline Phosphatase: 56 U/L (ref 38–126)
Anion gap: 8 (ref 5–15)
BUN: 16 mg/dL (ref 8–23)
CO2: 23 mmol/L (ref 22–32)
Calcium: 8.6 mg/dL — ABNORMAL LOW (ref 8.9–10.3)
Chloride: 103 mmol/L (ref 98–111)
Creatinine: 0.74 mg/dL (ref 0.44–1.00)
GFR, Estimated: >60 mL/min (ref >60)
Glucose, Bld: 95 mg/dL (ref 70–99)
Potassium: 3.8 mmol/L (ref 3.5–5.1)
Sodium: 134 mmol/L — ABNORMAL LOW (ref 135–145)
Total Bilirubin: 0.6 mg/dL (ref 0.0–1.2)
Total Protein: 6.4 g/dL — ABNORMAL LOW (ref 6.5–8.1)

## 2023-10-07 LAB — RAD ONC ARIA SESSION SUMMARY
Course Elapsed Days: 28
Plan Fractions Treated to Date: 20
Plan Prescribed Dose Per Fraction: 2 Gy
Plan Total Fractions Prescribed: 33
Plan Total Prescribed Dose: 66 Gy
Reference Point Dosage Given to Date: 40 Gy
Reference Point Session Dosage Given: 2 Gy
Session Number: 20

## 2023-10-07 MED ORDER — DIPHENHYDRAMINE HCL 50 MG/ML IJ SOLN
50.0000 mg | Freq: Once | INTRAMUSCULAR | Status: AC
  Administered 2023-10-07: 50 mg via INTRAVENOUS
  Filled 2023-10-07: qty 1

## 2023-10-07 MED ORDER — SODIUM CHLORIDE 0.9 % IV SOLN
231.6000 mg | Freq: Once | INTRAVENOUS | Status: AC
  Administered 2023-10-07: 230 mg via INTRAVENOUS
  Filled 2023-10-07: qty 23

## 2023-10-07 MED ORDER — FAMOTIDINE IN NACL 20-0.9 MG/50ML-% IV SOLN
20.0000 mg | Freq: Once | INTRAVENOUS | Status: AC
  Administered 2023-10-07: 20 mg via INTRAVENOUS
  Filled 2023-10-07: qty 50

## 2023-10-07 MED ORDER — DEXAMETHASONE SODIUM PHOSPHATE 10 MG/ML IJ SOLN
10.0000 mg | Freq: Once | INTRAMUSCULAR | Status: AC
  Administered 2023-10-07: 10 mg via INTRAVENOUS
  Filled 2023-10-07: qty 1

## 2023-10-07 MED ORDER — SODIUM CHLORIDE 0.9 % IV SOLN
45.0000 mg/m2 | Freq: Once | INTRAVENOUS | Status: AC
  Administered 2023-10-07: 84 mg via INTRAVENOUS
  Filled 2023-10-07: qty 14

## 2023-10-07 MED ORDER — HEPARIN SOD (PORK) LOCK FLUSH 100 UNIT/ML IV SOLN
500.0000 [IU] | Freq: Once | INTRAVENOUS | Status: AC | PRN
  Administered 2023-10-07: 500 [IU]
  Filled 2023-10-07: qty 5

## 2023-10-07 MED ORDER — SODIUM CHLORIDE 0.9 % IV SOLN
INTRAVENOUS | Status: DC
  Filled 2023-10-07: qty 250

## 2023-10-07 MED ORDER — PALONOSETRON HCL INJECTION 0.25 MG/5ML
0.2500 mg | Freq: Once | INTRAVENOUS | Status: AC
  Administered 2023-10-07: 0.25 mg via INTRAVENOUS
  Filled 2023-10-07: qty 5

## 2023-10-07 NOTE — Patient Instructions (Signed)

## 2023-10-07 NOTE — Progress Notes (Signed)
 Hematology/Oncology Progress note [phone] [phone]       CHIEF COMPLAINTS/PURPOSE OF CONSULTATION:  Stage III lung adenocarcinoam  ASSESSMENT & PLAN:   Cancer Staging  Primary lung adenocarcinoma, right [facility]) Staging form: Lung, AJCC V9 - Clinical stage from 08/15/2023: [name], cM0 - Signed by Babara Call, MD on 08/15/2023   Primary lung adenocarcinoma, right [facility]) Images and pathology results were reviewed with the patient. cT2 N2 M0, Stage III right lung adenocarcinoma.  NGS showed TPS 5% CPS 10  KRAS G12C, TMB 12.6  Labs are reviewed and discussed with patient. Proceed with carboplatin  AUC 2, Taxol  45mg /m2   Cough Improved. Guaifenesin  Codeine  cough suppressant PRN  Encounter for antineoplastic chemotherapy Chemotherapy treatment as planned above  Hypocalcemia Recommend calcium  supplementation 600mg  daily  Hypokalemia Potassium chloride  20meq daily   Pulmonary embolism (HCC) Anticoagulation with Eliquis  5mg  BID.    No orders of the defined types were placed in this encounter.  Follow-up 1 week All questions were answered. The patient knows to call the clinic with any problems, questions or concerns.  Call Babara, MD, PhD [facility] Oncology 10/07/2023    HISTORY OF PRESENTING ILLNESS:  ADALENE GULOTTA 63 y.o. female presents to establish care for lung cancer  Oncology History  Primary lung adenocarcinoma, right (HCC)  08/03/2023 Imaging   CT angiogram chest PE protocol showed  1. 3.5 x 2.6 cm right hilar mass, with obstruction of the bronchus intermedius, right middle lobe bronchus, and portions of the right lower lobe bronchus as above, highly concerning for neoplasm. Bronchoscopy is recommended for further evaluation. 2. Subcarinal adenopathy concerning for metastatic disease. 3. Dense medial segment consolidation within the right middle lobe with associated volume loss, consistent with atelectasis. 4. Prominent  right lower lobe bronchiectasis, with fluid-filled bronchi and patchy right basilar consolidation consistent with combination of airspace disease and atelectasis. 5. No evidence of pulmonary embolus. 6. Aortic Atherosclerosis (ICD10-I70.0). Coronary artery atherosclerosis.   08/06/2023 Initial Diagnosis   Primary lung adenocarcinoma, right (HCC)  08/03/2023, patient presented to emergency room for evaluation of hemoptysis. Patient had pneumonia in March 2025, she continues to have a lingering cough for the past months despite being treated with doxycycline  and prednisone . She coughed up small amount of bright red blood on 08/03/2023.  Denies any shortness of breath, chest pain unintentional weight loss headache.  She felt feverish with chills 2 nights ago. Patient is a former smoker, quit smoking in 2011. Patient denies being on any antiplatelet or anticoagulation agents.  08/08/2023 She underwent biopsy via bronchoscopy.  FINAL MICROSCOPIC DIAGNOSIS:  A. LUNG, RLL, BRUSHING:  - Adenocarcinoma   B. LUNG, RLL, FINE NEEDLE ASPIRATION:  - Adenocarcinoma   Immunohistochemical stains were performed to characterize the tumor cells. The cells are negative for TTF-1, Napsin A, p40, synaptophysin, Chromogranin A, and CD56.  Mucicarmine stain highlights intracytoplasmic mucin. Ki67 proliferation index is approximately 30%. The overall findings are supportive of the diagnosis of adenocarcinoma.   C. LYMPH NODE, 7, FINE NEEDLE ASPIRATION:  - Adenocarcinoma  - Lymphoid tissue present   D. LYMPH NODE, 4R, FINE NEEDLE ASPIRATION:  - No malignant cells identified  - Lymphoid tissue present   E. LYMPH NODE, 11R, FINE NEEDLE ASPIRATION:  - No malignant cells identified  - Lymphoid tissue present   F. LUNG, RLL, LAVAGE:  FINAL MICROSCOPIC DIAGNOSIS:  - Atypical cells present    08/15/2023 Cancer Staging   Staging form: Lung, AJCC V9 - Clinical stage from 08/15/2023: cT2a,  cN2, cM0 - Signed by Babara Call, MD on 08/15/2023 Stage prefix: Initial diagnosis Method of lymph node assessment: Clinical   09/09/2023 -  Chemotherapy   Patient is on Treatment Plan : LUNG Carboplatin  + Paclitaxel  + XRT q7d      She feels that cough has improved.  Palpitation has been better. She has no new concerns.  No additional episodes of hemoptysis. No additional episodes of hemoptysis.    MEDICAL HISTORY:  Past Medical History:  Diagnosis Date   COVID-19 07/2018   Hyperlipidemia    Hypertension    Lung cancer (HCC) 07/2023   Motion sickness    amuesment park rides   Osteopenia 11/01/2015   Pneumonia    april 2025   Psoriasis    Vertigo    none for over 5 yrs   Wears dentures    full upper and lower    SURGICAL HISTORY: Past Surgical History:  Procedure Laterality Date   BRONCHIAL BIOPSY  08/08/2023   Procedure: BRONCHOSCOPY, WITH BIOPSY;  Surgeon: Malka Domino, MD;  Location: [facility] END[facility]COPY;  Service: Pulmonary;;   BRONCHIAL BRUSHINGS  08/08/2023   Procedure: BRONCHOSCOPY, WITH BRUSH BIOPSY;  Surgeon: Malka Domino, MD;  Location: [facility] ENDOSCOPY;  Service: Pulmonary;;   BRONCHIAL NEEDLE ASPIRATION BIOPSY  08/08/2023   Procedure: BRONCHOSCOPY, WITH NEEDLE ASPIRATION BIOPSY;  Surgeon: Malka Domino, MD;  Location: [facility] ENDOSCOPY;  Service: Pulmonary;;   CHOLECYSTECTOMY  1990   COLONOSCOPY WITH PROPOFOL  N/A 07/15/2020   Procedure: COLONOSCOPY WITH PROPOFOL ;  Surgeon: Jinny Carmine, MD;  [facility];  Service: Endoscopy;  Laterality: N/A;  priority 4   ENDOBRONCHIAL ULTRASOUND Bilateral 08/08/2023   Procedure: ENDOBRONCHIAL ULTRASOUND (EBUS);  Surgeon: Malka Domino, MD;  Location[facility];  Service: Pulmonary;  Laterality: Bilateral;   IR IMAGING GUIDED PORT INSERTION  08/29/2023   TUBAL LIGATION      SOCIAL HISTORY: Social History   Socioeconomic History   Marital status: Married    Spouse name: Oneil   Number of children: 1   Years of education: Not  on file   Highest education level: High school graduate  Occupational History   Not on file  Tobacco Use   Smoking status: Former    Current packs/day: 0.00    Average packs/day: 1 pack/day for 28.0 years (28.0 ttl pk-yrs)    Types: Cigarettes    Start date: 19    Quit date: 2011    Years since quitting: 14.5   Smokeless tobacco: Never   Tobacco comments:    Started smoking around 63 yrs old.    Smoked 1PPD at her heaviest.    Quit smoking in 2011- khj 08/07/2023  Vaping Use   Vaping status: Former  Substance and Sexual Activity   Alcohol use: No    Alcohol/week: 0.0 standard drinks of alcohol    Comment: rare   Drug use: No   Sexual activity: Yes    Birth control/protection: Surgical  Other Topics Concern   Not on file  Social History Narrative   Not on file   Social Drivers of Health   Financial Resource Strain: Low Risk  (05/06/2018)   Overall Financial Resource Strain (CARDIA)    Difficulty of Paying Living Expenses: Not hard at all  Food Insecurity: No Food Insecurity (09/24/2023)   Hunger Vital Sign    Worried About Running Out of Food in the Last Year: Never true    Ran Out of Food in the Last Year: Never true  Transportation Needs: No Transportation Needs (09/24/2023)   PRAPARE - Administrator, Civil Service (Medical): No    Lack of Transportation (Non-Medical): No  Physical Activity: Sufficiently Active (05/06/2018)   Exercise Vital Sign    Days of Exercise per Week: 7 days    Minutes of Exercise per Session: 30 min  Stress: No Stress Concern Present (05/06/2018)   [employer] of Occupational Health - Occupational Stress Questionnaire    Feeling of Stress : Not at all  Social Connections: Moderately Isolated (09/24/2023)   Social Connection and Isolation Panel    Frequency of Communication with Friends and Family: More than three times a week    Frequency of Social Gatherings with Friends and Family: Twice a week    Attends Religious  Services: Never    Database administrator or Organizations: No    Attends Banker Meetings: Never    Marital Status: Married  Catering manager Violence: Not At Risk (09/24/2023)   Humiliation, Afraid, Rape, and Kick questionnaire    Fear of Current or Ex-Partner: No    Emotionally Abused: No    Physically Abused: No    Sexually Abused: No    FAMILY HISTORY: Family History  Problem Relation Age of Onset   Colon cancer Brother        69    Breast cancer Mother    Breast cancer Maternal Aunt    Pneumonia Father    Diabetes Maternal Grandmother     ALLERGIES:  is allergic to lisinopril .  MEDICATIONS:  Current Outpatient Medications  Medication Sig Dispense Refill   APIXABAN  (ELIQUIS ) VTE STARTER PACK (10MG  AND 5MG ) Take as directed on package: start with two-5mg  tablets twice daily for 7 days. On day 8, switch to one-5mg  tablet twice daily. 74 each 0   atorvastatin  (LIPITOR) 10 MG tablet TAKE 1 TABLET BY MOUTH AT BEDTIME 90 tablet 1   benzonatate  (TESSALON  PERLES) 100 MG capsule Take 1 capsule (100 mg total) by mouth 2 (two) times daily as needed for cough. 30 capsule 1   dexamethasone  (DECADRON ) 4 MG tablet Take 2 tablets daily for 2 days, start the day after chemotherapy. Take with food. 30 tablet 1   guaifenesin  (ROBITUSSIN) 100 MG/5ML syrup Take 200 mg by mouth 3 (three) times daily as needed for cough.     guaiFENesin -codeine  100-10 MG/5ML syrup Take 5 mLs by mouth every 6 (six) hours as needed for cough. 118 mL 0   levalbuterol  (XOPENEX  HFA) 45 MCG/ACT inhaler Inhale 2 puffs into the lungs every 6 (six) hours as needed for wheezing. 15 g 3   lidocaine -prilocaine  (EMLA ) cream Apply to affected area once 30 g 3   losartan  (COZAAR ) 25 MG tablet Take 1/2 (one-half) tablet by mouth once daily 45 tablet 0   meclizine  (ANTIVERT ) 25 MG tablet Take 1 tablet (25 mg total) by mouth 3 (three) times daily as needed for dizziness. 30 tablet 3   ondansetron  (ZOFRAN ) 8 MG tablet  Take 1 tablet (8 mg total) by mouth every 8 (eight) hours as needed for nausea or vomiting. Start on the third day after chemotherapy. 30 tablet 1   potassium chloride  SA (KLOR-CON  M) 20 MEQ tablet Take 1 tablet (20 mEq total) by mouth daily. 30 tablet 0   prochlorperazine  (COMPAZINE ) 10 MG tablet Take 1 tablet (10 mg total) by mouth every 6 (six) hours as needed for nausea or vomiting. 30 tablet 1   sucralfate  (CARAFATE ) 1 g tablet  Take 1 tablet (1 g total) by mouth 3 (three) times daily before meals. 90 tablet 1   triamcinolone  ointment (KENALOG ) 0.5 % Apply 1 Application topically 2 (two) times daily. 30 g 6   No current facility-administered medications for this visit.   Facility-Administered Medications Ordered in Other Visits  Medication Dose Route Frequency Provider Last Rate Last Admin   0.9 %  sodium chloride  infusion   Intravenous Continuous Babara Call, MD   Stopped at 10/07/23 1239    Review of Systems  Constitutional:  Negative for appetite change, chills, fatigue and fever.  HENT:   Negative for hearing loss and voice change.   Eyes:  Negative for eye problems.  Respiratory:  Positive for cough. Negative for chest tightness and hemoptysis.   Cardiovascular:  Negative for chest pain.  Gastrointestinal:  Negative for abdominal distention, abdominal pain and blood in stool.  Endocrine: Negative for hot flashes.  Genitourinary:  Negative for difficulty urinating and frequency.   Musculoskeletal:  Negative for arthralgias.  Skin:  Negative for itching and rash.  Neurological:  Negative for extremity weakness.  Hematological:  Negative for adenopathy.  Psychiatric/Behavioral:  Negative for confusion.      PHYSICAL EXAMINATION: ECOG PERFORMANCE STATUS: 0 - Asymptomatic  Vitals:   10/07/23 0916  BP: 110/60  Pulse: 100  Resp: 18  Temp: 99.1 F (37.3 C)  SpO2: 99%   Filed Weights   10/07/23 0916  Weight: 167 lb (75.8 kg)    Physical Exam Constitutional:      General:  She is not in acute distress.    Appearance: She is not diaphoretic.  HENT:     Head: Normocephalic and atraumatic.   Eyes:     General: No scleral icterus.   Cardiovascular:     Rate and Rhythm: Normal rate and regular rhythm.     Heart sounds: No murmur heard. Pulmonary:     Effort: Pulmonary effort is normal. No respiratory distress.     Breath sounds: No wheezing.  Abdominal:     General: There is no distension.     Palpations: Abdomen is soft.     Tenderness: There is no abdominal tenderness.   Musculoskeletal:        General: Normal range of motion.     Cervical back: Normal range of motion and neck supple.   Skin:    General: Skin is warm and dry.     Findings: No erythema.   Neurological:     Mental Status: She is alert and oriented to person, place, and time. Mental status is at baseline.     Motor: No abnormal muscle tone.   Psychiatric:        Mood and Affect: Affect normal.      LABORATORY DATA:  I have reviewed the data as listed    Latest Ref Rng & Units 10/07/2023    8:56 AM 09/30/2023    9:16 AM 09/24/2023    5:21 AM  CBC  WBC 4.0 - 10.5 K/uL 4.6  6.1  3.9   Hemoglobin 12.0 - 15.0 g/dL 88.7  88.3  88.2   Hematocrit 36.0 - 46.0 % 33.2  33.9  35.1   Platelets 150 - 400 K/uL 120  214  264       Latest Ref Rng & Units 10/07/2023    8:56 AM 09/30/2023    9:16 AM 09/24/2023    5:21 AM  CMP  Glucose 70 - 99 mg/dL 95  894  124   BUN 8 - 23 mg/dL 16  20  18    Creatinine 0.44 - 1.00 mg/dL 9.25  9.25  9.42   Sodium 135 - 145 mmol/L 134  135  138   Potassium 3.5 - 5.1 mmol/L 3.8  3.1  3.9   Chloride 98 - 111 mmol/L 103  104  108   CO2 22 - 32 mmol/L 23  23  23    Calcium  8.9 - 10.3 mg/dL 8.6  9.0  9.1   Total Protein 6.5 - 8.1 g/dL 6.4  6.3    Total Bilirubin 0.0 - 1.2 mg/dL 0.6  0.7    Alkaline Phos 38 - 126 U/L 56  56    AST 15 - 41 U/L 19  17    ALT 0 - 44 U/L 24  24       RADIOGRAPHIC STUDIES: I have personally reviewed the radiological images  as listed and agreed with the findings in the report. CT Angio Chest PE W and/or Wo Contrast Result Date: 09/23/2023 CLINICAL DATA:  Hemoptysis.  Lung cancer.  * Tracking Code: BO * EXAM: CT ANGIOGRAPHY CHEST WITH CONTRAST TECHNIQUE: Multidetector CT imaging of the chest was performed using the standard protocol during bolus administration of intravenous contrast. Multiplanar CT image reconstructions and MIPs were obtained to evaluate the vascular anatomy. RADIATION DOSE REDUCTION: This exam was performed according to the departmental dose-optimization program which includes automated exposure control, adjustment of the mA and/or kV according to patient size and/or use of iterative reconstruction technique. CONTRAST:  75mL OMNIPAQUE  IOHEXOL  350 MG/ML SOLN COMPARISON:  08/03/2023. FINDINGS: Cardiovascular: There are filling defects in the lingular segmental bronchi (6/114-123). Atherosclerotic calcification of the aorta and circumflex coronary artery. Heart is enlarged. No pericardial effusion. Right IJ Port-A-Cath terminates at the SVC RA junction. Mediastinum/Nodes: Subcarinal and right hilar adenopathy measures 2.5 cm and 3.2 cm, respectively, increased in size from 2.0 cm and 2.6 cm, respectively, on 08/03/2023. No left hilar or axillary adenopathy. Esophagus is grossly unremarkable. Lungs/Pleura: Right hilar adenopathy markedly narrows the right middle and right lower lobe bronchi, creating endobronchial debris and airspace consolidation in the right middle and right lower lobes, increased from 07/14/2023. No pleural fluid. Airway is otherwise unremarkable. Upper Abdomen: Cholecystectomy. Visualized portions of the liver, adrenal glands, kidneys, spleen, pancreas, stomach and bowel are otherwise grossly unremarkable. No upper abdominal adenopathy. Musculoskeletal: Degenerative changes in the spine. Review of the MIP images confirms the above findings. IMPRESSION: 1. Segmental pulmonary emboli in the lingula.  Critical Value/emergent results were called by telephone at the time of interpretation on 09/23/2023 at 6:49 pm to provider JENISE MENSHEW, who verbally acknowledged these results. 2. Enlarging right hilar and subcarinal nodal masses, compatible with biopsy-proven adenocarcinoma. Associated marked narrowing of the right middle and right lower lobe bronchi with endobronchial debris and postobstructive airspace consolidation in the right middle and right lower lobes. 3. Aortic atherosclerosis (ICD10-I70.0). Circumflex coronary artery calcification. Electronically Signed   By: Newell Eke M.D.   On: 09/23/2023 18:50

## 2023-10-07 NOTE — Assessment & Plan Note (Signed)
 Chemotherapy treatment as planned above

## 2023-10-07 NOTE — Assessment & Plan Note (Signed)
 Images and pathology results were reviewed with the patient. cT2 N2 M0, Stage III right lung adenocarcinoma.  NGS showed TPS 5% CPS 10  KRAS G12C, TMB 12.6  Labs are reviewed and discussed with patient. Proceed with carboplatin AUC 2, Taxol 45mg /m2

## 2023-10-07 NOTE — Assessment & Plan Note (Signed)
?   Anticoagulation with Eliquis 5 mg BID

## 2023-10-07 NOTE — Assessment & Plan Note (Signed)
 Recommend calcium  supplementation 600mg  daily

## 2023-10-07 NOTE — Assessment & Plan Note (Signed)
Potassium chloride daily

## 2023-10-07 NOTE — Progress Notes (Signed)
 Patient had a blood clot in her lung, so she is now taking Eliquis  5 mg.   Patient says that her heart palpitations are better, and she wants to know if she has to continue on the potassium pills?

## 2023-10-07 NOTE — Assessment & Plan Note (Signed)
 Improved. Guaifenesin  Codeine  cough suppressant PRN

## 2023-10-08 ENCOUNTER — Other Ambulatory Visit: Payer: Self-pay

## 2023-10-08 ENCOUNTER — Ambulatory Visit
Admission: RE | Admit: 2023-10-08 | Discharge: 2023-10-08 | Disposition: A | Discharge status: Home / Self Care | Source: Ambulatory Visit | Attending: Radiation Oncology | Admitting: Radiation Oncology

## 2023-10-08 DIAGNOSIS — Z800 Family history of malignant neoplasm of digestive organs: Secondary | ICD-10-CM | POA: Diagnosis not present

## 2023-10-08 DIAGNOSIS — Z803 Family history of malignant neoplasm of breast: Secondary | ICD-10-CM | POA: Diagnosis not present

## 2023-10-08 DIAGNOSIS — C3491 Malignant neoplasm of unspecified part of right bronchus or lung: Secondary | ICD-10-CM | POA: Diagnosis present

## 2023-10-08 DIAGNOSIS — Z5111 Encounter for antineoplastic chemotherapy: Secondary | ICD-10-CM | POA: Diagnosis present

## 2023-10-08 DIAGNOSIS — E876 Hypokalemia: Secondary | ICD-10-CM | POA: Diagnosis not present

## 2023-10-08 DIAGNOSIS — Z87891 Personal history of nicotine dependence: Secondary | ICD-10-CM | POA: Diagnosis not present

## 2023-10-08 DIAGNOSIS — R059 Cough, unspecified: Secondary | ICD-10-CM | POA: Diagnosis not present

## 2023-10-08 LAB — RAD ONC ARIA SESSION SUMMARY
Course Elapsed Days: 29
Plan Fractions Treated to Date: 21
Plan Prescribed Dose Per Fraction: 2 Gy
Plan Total Fractions Prescribed: 33
Plan Total Prescribed Dose: 66 Gy
Reference Point Dosage Given to Date: 42 Gy
Reference Point Session Dosage Given: 2 Gy
Session Number: 21

## 2023-10-09 ENCOUNTER — Ambulatory Visit
Admission: RE | Admit: 2023-10-09 | Discharge: 2023-10-09 | Disposition: A | Discharge status: Home / Self Care | Source: Ambulatory Visit | Attending: Radiation Oncology | Admitting: Radiation Oncology

## 2023-10-09 ENCOUNTER — Other Ambulatory Visit: Payer: Self-pay

## 2023-10-09 DIAGNOSIS — Z5111 Encounter for antineoplastic chemotherapy: Secondary | ICD-10-CM | POA: Diagnosis not present

## 2023-10-09 LAB — RAD ONC ARIA SESSION SUMMARY
Course Elapsed Days: 30
Plan Fractions Treated to Date: 22
Plan Prescribed Dose Per Fraction: 2 Gy
Plan Total Fractions Prescribed: 33
Plan Total Prescribed Dose: 66 Gy
Reference Point Dosage Given to Date: 44 Gy
Reference Point Session Dosage Given: 2 Gy
Session Number: 22

## 2023-10-10 ENCOUNTER — Ambulatory Visit
Admission: RE | Admit: 2023-10-10 | Discharge: 2023-10-10 | Disposition: A | Discharge status: Home / Self Care | Source: Ambulatory Visit | Attending: Radiation Oncology | Admitting: Radiation Oncology

## 2023-10-10 ENCOUNTER — Other Ambulatory Visit: Payer: Self-pay

## 2023-10-10 DIAGNOSIS — Z5111 Encounter for antineoplastic chemotherapy: Secondary | ICD-10-CM | POA: Diagnosis not present

## 2023-10-10 LAB — RAD ONC ARIA SESSION SUMMARY
Course Elapsed Days: 31
Plan Fractions Treated to Date: 23
Plan Prescribed Dose Per Fraction: 2 Gy
Plan Total Fractions Prescribed: 33
Plan Total Prescribed Dose: 66 Gy
Reference Point Dosage Given to Date: 46 Gy
Reference Point Session Dosage Given: 2 Gy
Session Number: 23

## 2023-10-14 ENCOUNTER — Inpatient Hospital Stay: Attending: Oncology

## 2023-10-14 ENCOUNTER — Inpatient Hospital Stay

## 2023-10-14 ENCOUNTER — Encounter: Payer: Self-pay | Admitting: *Deleted

## 2023-10-14 ENCOUNTER — Ambulatory Visit
Admission: RE | Admit: 2023-10-14 | Discharge: 2023-10-14 | Disposition: A | Discharge status: Home / Self Care | Source: Ambulatory Visit | Attending: Radiation Oncology | Admitting: Radiation Oncology

## 2023-10-14 ENCOUNTER — Inpatient Hospital Stay (HOSPITAL_BASED_OUTPATIENT_CLINIC_OR_DEPARTMENT_OTHER): Admitting: Oncology

## 2023-10-14 ENCOUNTER — Other Ambulatory Visit: Payer: Self-pay

## 2023-10-14 ENCOUNTER — Encounter: Payer: Self-pay | Admitting: Oncology

## 2023-10-14 VITALS — HR 99

## 2023-10-14 VITALS — BP 108/74 | HR 107 | Temp 98.5°F | Resp 18 | Wt 163.8 lb

## 2023-10-14 DIAGNOSIS — E876 Hypokalemia: Secondary | ICD-10-CM | POA: Diagnosis not present

## 2023-10-14 DIAGNOSIS — Z5111 Encounter for antineoplastic chemotherapy: Secondary | ICD-10-CM | POA: Diagnosis not present

## 2023-10-14 DIAGNOSIS — Z87891 Personal history of nicotine dependence: Secondary | ICD-10-CM | POA: Insufficient documentation

## 2023-10-14 DIAGNOSIS — I2693 Single subsegmental pulmonary embolism without acute cor pulmonale: Secondary | ICD-10-CM | POA: Diagnosis not present

## 2023-10-14 DIAGNOSIS — C3491 Malignant neoplasm of unspecified part of right bronchus or lung: Secondary | ICD-10-CM | POA: Diagnosis not present

## 2023-10-14 DIAGNOSIS — R059 Cough, unspecified: Secondary | ICD-10-CM | POA: Insufficient documentation

## 2023-10-14 DIAGNOSIS — Z803 Family history of malignant neoplasm of breast: Secondary | ICD-10-CM | POA: Insufficient documentation

## 2023-10-14 DIAGNOSIS — Z800 Family history of malignant neoplasm of digestive organs: Secondary | ICD-10-CM | POA: Insufficient documentation

## 2023-10-14 LAB — CMP (CANCER CENTER ONLY)
ALT: 25 U/L (ref 0–44)
AST: 22 U/L (ref 15–41)
Albumin: 3.7 g/dL (ref 3.5–5.0)
Alkaline Phosphatase: 52 U/L (ref 38–126)
Anion gap: 8 (ref 5–15)
BUN: 20 mg/dL (ref 8–23)
CO2: 23 mmol/L (ref 22–32)
Calcium: 9 mg/dL (ref 8.9–10.3)
Chloride: 105 mmol/L (ref 98–111)
Creatinine: 0.99 mg/dL (ref 0.44–1.00)
GFR, Estimated: >60 mL/min (ref >60)
Glucose, Bld: 127 mg/dL — ABNORMAL HIGH (ref 70–99)
Potassium: 3.3 mmol/L — ABNORMAL LOW (ref 3.5–5.1)
Sodium: 136 mmol/L (ref 135–145)
Total Bilirubin: 0.7 mg/dL (ref 0.0–1.2)
Total Protein: 6.1 g/dL — ABNORMAL LOW (ref 6.5–8.1)

## 2023-10-14 LAB — CBC WITH DIFFERENTIAL (CANCER CENTER ONLY)
Abs Immature Granulocytes: 0.02 K/uL (ref 0.00–0.07)
Basophils Absolute: 0 K/uL (ref 0.0–0.1)
Basophils Relative: 1 %
Eosinophils Absolute: 0.1 K/uL (ref 0.0–0.5)
Eosinophils Relative: 2 %
HCT: 31.8 % — ABNORMAL LOW (ref 36.0–46.0)
Hemoglobin: 10.6 g/dL — ABNORMAL LOW (ref 12.0–15.0)
Immature Granulocytes: 1 %
Lymphocytes Relative: 21 %
Lymphs Abs: 0.7 K/uL (ref 0.7–4.0)
MCH: 27.5 pg (ref 26.0–34.0)
MCHC: 33.3 g/dL (ref 30.0–36.0)
MCV: 82.6 fL (ref 80.0–100.0)
Monocytes Absolute: 0.2 K/uL (ref 0.1–1.0)
Monocytes Relative: 6 %
Neutro Abs: 2.2 K/uL (ref 1.7–7.7)
Neutrophils Relative %: 69 %
Platelet Count: 151 K/uL (ref 150–400)
RBC: 3.85 MIL/uL — ABNORMAL LOW (ref 3.87–5.11)
RDW: 13.7 % (ref 11.5–15.5)
WBC Count: 3.2 K/uL — ABNORMAL LOW (ref 4.0–10.5)
nRBC: 0 % (ref 0.0–0.2)

## 2023-10-14 LAB — RAD ONC ARIA SESSION SUMMARY
Course Elapsed Days: 35
Plan Fractions Treated to Date: 24
Plan Prescribed Dose Per Fraction: 2 Gy
Plan Total Fractions Prescribed: 33
Plan Total Prescribed Dose: 66 Gy
Reference Point Dosage Given to Date: 48 Gy
Reference Point Session Dosage Given: 2 Gy
Session Number: 24

## 2023-10-14 MED ORDER — DEXAMETHASONE SODIUM PHOSPHATE 10 MG/ML IJ SOLN
10.0000 mg | Freq: Once | INTRAMUSCULAR | Status: AC
  Administered 2023-10-14: 10 mg via INTRAVENOUS
  Filled 2023-10-14: qty 1

## 2023-10-14 MED ORDER — PALONOSETRON HCL INJECTION 0.25 MG/5ML
0.2500 mg | Freq: Once | INTRAVENOUS | Status: AC
  Administered 2023-10-14: 0.25 mg via INTRAVENOUS
  Filled 2023-10-14: qty 5

## 2023-10-14 MED ORDER — FAMOTIDINE IN NACL 20-0.9 MG/50ML-% IV SOLN
20.0000 mg | Freq: Once | INTRAVENOUS | Status: AC
  Administered 2023-10-14: 20 mg via INTRAVENOUS
  Filled 2023-10-14: qty 50

## 2023-10-14 MED ORDER — SODIUM CHLORIDE 0.9 % IV SOLN
INTRAVENOUS | Status: DC
  Filled 2023-10-14: qty 250

## 2023-10-14 MED ORDER — HEPARIN SOD (PORK) LOCK FLUSH 100 UNIT/ML IV SOLN
500.0000 [IU] | Freq: Once | INTRAVENOUS | Status: DC | PRN
  Filled 2023-10-14: qty 5

## 2023-10-14 MED ORDER — DIPHENHYDRAMINE HCL 50 MG/ML IJ SOLN
50.0000 mg | Freq: Once | INTRAMUSCULAR | Status: AC
  Administered 2023-10-14: 50 mg via INTRAVENOUS
  Filled 2023-10-14: qty 1

## 2023-10-14 MED ORDER — APIXABAN 5 MG PO TABS: 5.0000 mg | ORAL_TABLET | Freq: Two times a day (BID) | ORAL | 4 refills | Status: DC

## 2023-10-14 MED ORDER — SODIUM CHLORIDE 0.9 % IV SOLN
210.0000 mg | Freq: Once | INTRAVENOUS | Status: AC
  Administered 2023-10-14: 210 mg via INTRAVENOUS
  Filled 2023-10-14: qty 21

## 2023-10-14 MED ORDER — SODIUM CHLORIDE 0.9 % IV SOLN
45.0000 mg/m2 | Freq: Once | INTRAVENOUS | Status: AC
  Administered 2023-10-14: 84 mg via INTRAVENOUS
  Filled 2023-10-14: qty 14

## 2023-10-14 NOTE — Assessment & Plan Note (Signed)
 Recommend calcium  supplementation 600mg  daily

## 2023-10-14 NOTE — Assessment & Plan Note (Signed)
Potassium chloride daily

## 2023-10-14 NOTE — Progress Notes (Signed)
 Hematology/Oncology Progress note [phone] [phone]       CHIEF COMPLAINTS/PURPOSE OF CONSULTATION:  Stage III lung adenocarcinoam  ASSESSMENT & PLAN:   Cancer Staging  Primary lung adenocarcinoma, right [facility]) Staging form: Lung, AJCC V9 - Clinical stage from 08/15/2023: Tanya Howell, cM0 - Signed by Babara Call, MD on 08/15/2023   Primary lung adenocarcinoma, right [facility]) Images and pathology results were reviewed with the patient. cT2 N2 M0, Stage III right lung adenocarcinoma.  NGS showed TPS 5% CPS 10  KRAS G12C, TMB 12.6  Labs are reviewed and discussed with patient. Proceed with carboplatin  AUC 2, Taxol  45mg /m2   Pulmonary embolism (HCC) Anticoagulation with Eliquis  5mg  BID.    Hypokalemia Potassium chloride  20meq daily   Hypocalcemia Recommend calcium  supplementation 600mg  daily  Encounter for antineoplastic chemotherapy Chemotherapy treatment as planned above   No orders of the defined types were placed in this encounter.  Follow-up 1 week All questions were answered. The patient knows to call the clinic with any problems, questions or concerns.  Call Babara, MD, PhD [facility] Hematology Oncology 10/14/2023    HISTORY OF PRESENTING ILLNESS:  Tanya Howell 63 y.o. female presents to establish care for lung cancer  Oncology History  Primary lung adenocarcinoma, right (HCC)  08/03/2023 Imaging   CT angiogram chest PE protocol showed  1. 3.5 x 2.6 cm right hilar mass, with obstruction of the bronchus intermedius, right middle lobe bronchus, and portions of the right lower lobe bronchus as above, highly concerning for neoplasm. Bronchoscopy is recommended for further evaluation. 2. Subcarinal adenopathy concerning for metastatic disease. 3. Dense medial segment consolidation within the right middle lobe with associated volume loss, consistent with atelectasis. 4. Prominent right lower lobe bronchiectasis, with fluid-filled bronchi and  patchy right basilar consolidation consistent with combination of airspace disease and atelectasis. 5. No evidence of pulmonary embolus. 6. Aortic Atherosclerosis (ICD10-I70.0). Coronary artery atherosclerosis.   08/06/2023 Initial Diagnosis   Primary lung adenocarcinoma, right (HCC)  08/03/2023, patient presented to emergency room for evaluation of hemoptysis. Patient had pneumonia in March 2025, she continues to have a lingering cough for the past months despite being treated with doxycycline  and prednisone . She coughed up small amount of bright red blood on 08/03/2023.  Denies any shortness of breath, chest pain unintentional weight loss headache.  She felt feverish with chills 2 nights ago. Patient is a former smoker, quit smoking in 2011. Patient denies being on any antiplatelet or anticoagulation agents.  08/08/2023 She underwent biopsy via bronchoscopy.  FINAL MICROSCOPIC DIAGNOSIS:  A. LUNG, RLL, BRUSHING:  - Adenocarcinoma   B. LUNG, RLL, FINE NEEDLE ASPIRATION:  - Adenocarcinoma   Immunohistochemical stains were performed to characterize the tumor cells. The cells are negative for TTF-1, Napsin A, p40, synaptophysin, Chromogranin A, and CD56.  Mucicarmine stain highlights intracytoplasmic mucin. Ki67 proliferation index is approximately 30%. The overall findings are supportive of the diagnosis of adenocarcinoma.   C. LYMPH NODE, 7, FINE NEEDLE ASPIRATION:  - Adenocarcinoma  - Lymphoid tissue present   D. LYMPH NODE, 4R, FINE NEEDLE ASPIRATION:  - No malignant cells identified  - Lymphoid tissue present   E. LYMPH NODE, 11R, FINE NEEDLE ASPIRATION:  - No malignant cells identified  - Lymphoid tissue present   F. LUNG, RLL, LAVAGE:  FINAL MICROSCOPIC DIAGNOSIS:  - Atypical cells present    08/15/2023 Cancer Staging   Staging form: Lung, AJCC V9 - Clinical stage from 08/15/2023: Tanya Howell, cM0 - Signed by Babara Call,  MD on 08/15/2023 Stage prefix: Initial diagnosis Method  of lymph node assessment: Clinical   09/09/2023 -  Chemotherapy   Patient is on Treatment Plan : LUNG Carboplatin  + Paclitaxel  + XRT q7d      She feels that cough has improved.  Palpitation has been better. She has no new concerns.  No additional episodes of hemoptysis. No additional episodes of hemoptysis.    MEDICAL HISTORY:  Past Medical History:  Diagnosis Date   COVID-19 07/2018   Hyperlipidemia    Hypertension    Lung cancer (HCC) 07/2023   Motion sickness    amuesment park rides   Osteopenia 11/01/2015   Pneumonia    april 2025   Psoriasis    Vertigo    none for over 5 yrs   Wears dentures    full upper and lower    SURGICAL HISTORY: Past Surgical History:  Procedure Laterality Date   BRONCHIAL BIOPSY  08/08/2023   Procedure: BRONCHOSCOPY, WITH BIOPSY;  Surgeon: Malka Domino, MD;  Location: [facility];  Service: Pulmonary;;   BRONCHIAL BRUSHINGS  08/08/2023   Procedure: BRONCHOSCOPY, WITH BRUSH BIOPSY;  Surgeon: Malka Domino, MD;  Location: [facility];  Service: Pulmonary;;   BRONCHIAL NEEDLE ASPIRATION BIOPSY  08/08/2023   Procedure: BRONCHOSCOPY, WITH NEEDLE ASPIRATION BIOPSY;  Surgeon: Malka Domino, MD;  Location: [facility];  Service: Pulmonary;;   CHOLECYSTECTOMY  1990   COLONOSCOPY WITH PROPOFOL  N/A 07/15/2020   Procedure: COLONOSCOPY WITH PROPOFOL ;  Surgeon: Jinny Carmine, MD;  Location: [facility];  Service: Endoscopy;  Laterality: N/A;  priority 4   ENDOBRONCHIAL ULTRASOUND Bilateral 08/08/2023   Procedure: ENDOBRONCHIAL ULTRASOUND (EBUS);  Surgeon: Malka Domino, MD;  [facility];  Service: Pulmonary;  Laterality: Bilateral;   IR IMAGING GUIDED PORT INSERTION  08/29/2023   TUBAL LIGATION      SOCIAL HISTORY: Social History   Socioeconomic History   Marital status: Married    Spouse name: Oneil   Number of children: 1   Years of education: Not on file   Highest education level: High school graduate   Occupational History   Not on file  Tobacco Use   Smoking status: Former    Current packs/day: 0.00    Average packs/day: 1 pack/day for 28.0 years (28.0 ttl pk-yrs)    Types: Cigarettes    Start date: 71    Quit date: 2011    Years since quitting: 14.5   Smokeless tobacco: Never   Tobacco comments:    Started smoking around 63 yrs old.    Smoked 1PPD at her heaviest.    Quit smoking in 2011- leda 08/07/2023  Vaping Use   Vaping status: Former  Substance and Sexual Activity   Alcohol use: No    Alcohol/week: 0.0 standard drinks of alcohol    Comment: rare   Drug use: No   Sexual activity: Yes    Birth control/protection: Surgical  Other Topics Concern   Not on file  Social History Narrative   Not on file   Social Drivers of Health   Financial Resource Strain: Low Risk  (05/06/2018)   Overall Financial Resource Strain (CARDIA)    Difficulty of Paying Living Expenses: Not hard at all  Food Insecurity: No Food Insecurity (09/24/2023)   Hunger Vital Sign    Worried About Running Out of Food in the Last Year: Never true    Ran Out of Food in the Last Year: Never true  Transportation Needs: No Transportation Needs (09/24/2023)  PRAPARE - Administrator, Civil Service (Medical): No    Lack of Transportation (Non-Medical): No  Physical Activity: Sufficiently Active (05/06/2018)   Exercise Vital Sign    Days of Exercise per Week: 7 days    Minutes of Exercise per Session: 30 min  Stress: No Stress Concern Present (05/06/2018)   [employer] of Occupational Health - Occupational Stress Questionnaire    Feeling of Stress : Not at all  Social Connections: Moderately Isolated (09/24/2023)   Social Connection and Isolation Panel    Frequency of Communication with Friends and Family: More than three times a week    Frequency of Social Gatherings with Friends and Family: Twice a week    Attends Religious Services: Never    Database administrator or Organizations: No     Attends Banker Meetings: Never    Marital Status: Married  Catering manager Violence: Not At Risk (09/24/2023)   Humiliation, Afraid, Rape, and Kick questionnaire    Fear of Current or Ex-Partner: No    Emotionally Abused: No    Physically Abused: No    Sexually Abused: No    FAMILY HISTORY: Family History  Problem Relation Age of Onset   Colon cancer Brother        19    Breast cancer Mother    Breast cancer Maternal Aunt    Pneumonia Father    Diabetes Maternal Grandmother     ALLERGIES:  is allergic to lisinopril .  MEDICATIONS:  Current Outpatient Medications  Medication Sig Dispense Refill   apixaban  (ELIQUIS ) 5 MG TABS tablet Take 1 tablet (5 mg total) by mouth 2 (two) times daily. 60 tablet 4   atorvastatin  (LIPITOR) 10 MG tablet TAKE 1 TABLET BY MOUTH AT BEDTIME 90 tablet 1   benzonatate  (TESSALON  PERLES) 100 MG capsule Take 1 capsule (100 mg total) by mouth 2 (two) times daily as needed for cough. 30 capsule 1   dexamethasone  (DECADRON ) 4 MG tablet Take 2 tablets daily for 2 days, start the day after chemotherapy. Take with food. 30 tablet 1   guaifenesin  (ROBITUSSIN) 100 MG/5ML syrup Take 200 mg by mouth 3 (three) times daily as needed for cough.     guaiFENesin -codeine  100-10 MG/5ML syrup Take 5 mLs by mouth every 6 (six) hours as needed for cough. 118 mL 0   levalbuterol  (XOPENEX  HFA) 45 MCG/ACT inhaler Inhale 2 puffs into the lungs every 6 (six) hours as needed for wheezing. 15 g 3   lidocaine -prilocaine  (EMLA ) cream Apply to affected area once 30 g 3   losartan  (COZAAR ) 25 MG tablet Take 1/2 (one-half) tablet by mouth once daily 45 tablet 0   meclizine  (ANTIVERT ) 25 MG tablet Take 1 tablet (25 mg total) by mouth 3 (three) times daily as needed for dizziness. 30 tablet 3   ondansetron  (ZOFRAN ) 8 MG tablet Take 1 tablet (8 mg total) by mouth every 8 (eight) hours as needed for nausea or vomiting. Start on the third day after chemotherapy. 30 tablet 1    potassium chloride  SA (KLOR-CON  M) 20 MEQ tablet Take 1 tablet (20 mEq total) by mouth daily. 30 tablet 0   prochlorperazine  (COMPAZINE ) 10 MG tablet Take 1 tablet (10 mg total) by mouth every 6 (six) hours as needed for nausea or vomiting. 30 tablet 1   sucralfate  (CARAFATE ) 1 g tablet Take 1 tablet (1 g total) by mouth 3 (three) times daily before meals. 90 tablet 1   triamcinolone  ointment (  KENALOG ) 0.5 % Apply 1 Application topically 2 (two) times daily. 30 g 6   No current facility-administered medications for this visit.   Facility-Administered Medications Ordered in Other Visits  Medication Dose Route Frequency Provider Last Rate Last Admin   0.9 %  sodium chloride  infusion   Intravenous Continuous Babara Call, MD   Stopped at 10/14/23 1238   heparin  lock flush 100 unit/mL  500 Units Intracatheter Once PRN Babara Call, MD        Review of Systems  Constitutional:  Negative for appetite change, chills, fatigue and fever.  HENT:   Negative for hearing loss and voice change.   Eyes:  Negative for eye problems.  Respiratory:  Positive for cough. Negative for chest tightness and hemoptysis.   Cardiovascular:  Negative for chest pain.  Gastrointestinal:  Negative for abdominal distention, abdominal pain and blood in stool.  Endocrine: Negative for hot flashes.  Genitourinary:  Negative for difficulty urinating and frequency.   Musculoskeletal:  Negative for arthralgias.  Skin:  Negative for itching and rash.  Neurological:  Negative for extremity weakness.  Hematological:  Negative for adenopathy.  Psychiatric/Behavioral:  Negative for confusion.      PHYSICAL EXAMINATION: ECOG PERFORMANCE STATUS: 0 - Asymptomatic  Vitals:   10/14/23 0920  BP: 108/74  Pulse: (!) 107  Resp: 18  Temp: 98.5 F (36.9 C)  SpO2: 99%   Filed Weights   10/14/23 0920  Weight: 163 lb 12.8 oz (74.3 kg)    Physical Exam Constitutional:      General: She is not in acute distress.    Appearance: She is  not diaphoretic.  HENT:     Head: Normocephalic and atraumatic.  Eyes:     General: No scleral icterus. Cardiovascular:     Rate and Rhythm: Normal rate and regular rhythm.     Heart sounds: No murmur heard. Pulmonary:     Effort: Pulmonary effort is normal. No respiratory distress.     Breath sounds: No wheezing.  Abdominal:     General: There is no distension.     Palpations: Abdomen is soft.     Tenderness: There is no abdominal tenderness.  Musculoskeletal:        General: Normal range of motion.     Cervical back: Normal range of motion and neck supple.  Skin:    General: Skin is warm and dry.     Findings: No erythema.  Neurological:     Mental Status: She is alert and oriented to person, place, and time. Mental status is at baseline.     Motor: No abnormal muscle tone.  Psychiatric:        Mood and Affect: Affect normal.      LABORATORY DATA:  I have reviewed the data as listed    Latest Ref Rng & Units 10/14/2023    8:54 AM 10/07/2023    8:56 AM 09/30/2023    9:16 AM  CBC  WBC 4.0 - 10.5 K/uL 3.2  4.6  6.1   Hemoglobin 12.0 - 15.0 g/dL 89.3  88.7  88.3   Hematocrit 36.0 - 46.0 % 31.8  33.2  33.9   Platelets 150 - 400 K/uL 151  120  214       Latest Ref Rng & Units 10/14/2023    8:54 AM 10/07/2023    8:56 AM 09/30/2023    9:16 AM  CMP  Glucose 70 - 99 mg/dL 872  95  894   BUN 8 -  23 mg/dL 20  16  20    Creatinine 0.44 - 1.00 mg/dL 9.00  9.25  9.25   Sodium 135 - 145 mmol/L 136  134  135   Potassium 3.5 - 5.1 mmol/L 3.3  3.8  3.1   Chloride 98 - 111 mmol/L 105  103  104   CO2 22 - 32 mmol/L 23  23  23    Calcium  8.9 - 10.3 mg/dL 9.0  8.6  9.0   Total Protein 6.5 - 8.1 g/dL 6.1  6.4  6.3   Total Bilirubin 0.0 - 1.2 mg/dL 0.7  0.6  0.7   Alkaline Phos 38 - 126 U/L 52  56  56   AST 15 - 41 U/L 22  19  17    ALT 0 - 44 U/L 25  24  24       RADIOGRAPHIC STUDIES: I have personally reviewed the radiological images as listed and agreed with the findings in the  report. CT Angio Chest PE W and/or Wo Contrast Result Date: 09/23/2023 CLINICAL DATA:  Hemoptysis.  Lung cancer.  * Tracking Code: BO * EXAM: CT ANGIOGRAPHY CHEST WITH CONTRAST TECHNIQUE: Multidetector CT imaging of the chest was performed using the standard protocol during bolus administration of intravenous contrast. Multiplanar CT image reconstructions and MIPs were obtained to evaluate the vascular anatomy. RADIATION DOSE REDUCTION: This exam was performed according to the departmental dose-optimization program which includes automated exposure control, adjustment of the mA and/or kV according to patient size and/or use of iterative reconstruction technique. CONTRAST:  75mL OMNIPAQUE  IOHEXOL  350 MG/ML SOLN COMPARISON:  08/03/2023. FINDINGS: Cardiovascular: There are filling defects in the lingular segmental bronchi (6/114-123). Atherosclerotic calcification of the aorta and circumflex coronary artery. Heart is enlarged. No pericardial effusion. Right IJ Port-A-Cath terminates at the SVC RA junction. Mediastinum/Nodes: Subcarinal and right hilar adenopathy measures 2.5 cm and 3.2 cm, respectively, increased in size from 2.0 cm and 2.6 cm, respectively, on 08/03/2023. No left hilar or axillary adenopathy. Esophagus is grossly unremarkable. Lungs/Pleura: Right hilar adenopathy markedly narrows the right middle and right lower lobe bronchi, creating endobronchial debris and airspace consolidation in the right middle and right lower lobes, increased from 07/14/2023. No pleural fluid. Airway is otherwise unremarkable. Upper Abdomen: Cholecystectomy. Visualized portions of the liver, adrenal glands, kidneys, spleen, pancreas, stomach and bowel are otherwise grossly unremarkable. No upper abdominal adenopathy. Musculoskeletal: Degenerative changes in the spine. Review of the MIP images confirms the above findings. IMPRESSION: 1. Segmental pulmonary emboli in the lingula. Critical Value/emergent results were called by  telephone at the time of interpretation on 09/23/2023 at 6:49 pm to provider JENISE MENSHEW, who verbally acknowledged these results. 2. Enlarging right hilar and subcarinal nodal masses, compatible with biopsy-proven adenocarcinoma. Associated marked narrowing of the right middle and right lower lobe bronchi with endobronchial debris and postobstructive airspace consolidation in the right middle and right lower lobes. 3. Aortic atherosclerosis (ICD10-I70.0). Circumflex coronary artery calcification. Electronically Signed   By: Newell Eke M.D.   On: 09/23/2023 18:50

## 2023-10-14 NOTE — Assessment & Plan Note (Signed)
?   Anticoagulation with Eliquis 5 mg BID

## 2023-10-14 NOTE — Patient Instructions (Signed)
 [facility] - A DEPT OF [facility]  Discharge Instructions: Thank you for choosing [facility] to provide your oncology and hematology care.  If you have a lab appointment with the Cancer Center, please go directly to the Cancer Center and check in at the registration area.  Wear comfortable clothing and clothing appropriate for easy access to any Portacath or PICC line.   We strive to give you quality time with your provider. You may need to reschedule your appointment if you arrive late (15 or more minutes).  Arriving late affects you and other patients whose appointments are after yours.  Also, if you miss three or more appointments without notifying the office, you may be dismissed from the clinic at the provider's discretion.      For prescription refill requests, have your pharmacy contact our office and allow 72 hours for refills to be completed.    Today you received the following chemotherapy and/or immunotherapy agents Taxol & Carboplatin      To help prevent nausea and vomiting after your treatment, we encourage you to take your nausea medication as directed.  BELOW ARE SYMPTOMS THAT SHOULD BE REPORTED IMMEDIATELY: *FEVER GREATER THAN 100.4 F (38 C) OR HIGHER *CHILLS OR SWEATING *NAUSEA AND VOMITING THAT IS NOT CONTROLLED WITH YOUR NAUSEA MEDICATION *UNUSUAL SHORTNESS OF BREATH *UNUSUAL BRUISING OR BLEEDING *URINARY PROBLEMS (pain or burning when urinating, or frequent urination) *BOWEL PROBLEMS (unusual diarrhea, constipation, pain near the anus) TENDERNESS IN MOUTH AND THROAT WITH OR WITHOUT PRESENCE OF ULCERS (sore throat, sores in mouth, or a toothache) UNUSUAL RASH, SWELLING OR PAIN  UNUSUAL VAGINAL DISCHARGE OR ITCHING   Items with * indicate a potential emergency and should be followed up as soon as possible or go to the Emergency Department if any problems should occur.  Please show the CHEMOTHERAPY ALERT CARD or  IMMUNOTHERAPY ALERT CARD at check-in to the Emergency Department and triage nurse.  Should you have questions after your visit or need to cancel or reschedule your appointment, please contact [facility] - A DEPT OF [facility]  [phone] and follow the prompts.  Office hours are 8:00 a.m. to 4:30 p.m. Monday - Friday. Please note that voicemails left after 4:00 p.m. may not be returned until the following business day.  We are closed weekends and major holidays. You have access to a nurse at all times for urgent questions. Please call the main number to the clinic [phone] and follow the prompts.  For any non-urgent questions, you may also contact your provider using MyChart. We now offer e-Visits for anyone 67 and older to request care online for non-urgent symptoms. For details visit [email or web address].   Also download the MyChart app! Go to the app store, search "MyChart", open the app, select [facility], and log in with your MyChart username and password.

## 2023-10-14 NOTE — Assessment & Plan Note (Signed)
 Images and pathology results were reviewed with the patient. cT2 N2 M0, Stage III right lung adenocarcinoma.  NGS showed TPS 5% CPS 10  KRAS G12C, TMB 12.6  Labs are reviewed and discussed with patient. Proceed with carboplatin AUC 2, Taxol 45mg /m2

## 2023-10-14 NOTE — Assessment & Plan Note (Signed)
 Chemotherapy treatment as planned above

## 2023-10-15 ENCOUNTER — Other Ambulatory Visit: Payer: Self-pay

## 2023-10-15 ENCOUNTER — Ambulatory Visit
Admission: RE | Admit: 2023-10-15 | Discharge: 2023-10-15 | Disposition: A | Discharge status: Home / Self Care | Source: Ambulatory Visit | Attending: Radiation Oncology | Admitting: Radiation Oncology

## 2023-10-15 DIAGNOSIS — Z5111 Encounter for antineoplastic chemotherapy: Secondary | ICD-10-CM | POA: Diagnosis not present

## 2023-10-15 LAB — RAD ONC ARIA SESSION SUMMARY
Course Elapsed Days: 36
Plan Fractions Treated to Date: 25
Plan Prescribed Dose Per Fraction: 2 Gy
Plan Total Fractions Prescribed: 33
Plan Total Prescribed Dose: 66 Gy
Reference Point Dosage Given to Date: 50 Gy
Reference Point Session Dosage Given: 2 Gy
Session Number: 25

## 2023-10-16 ENCOUNTER — Other Ambulatory Visit: Payer: Self-pay | Admitting: *Deleted

## 2023-10-16 ENCOUNTER — Inpatient Hospital Stay

## 2023-10-16 ENCOUNTER — Other Ambulatory Visit: Payer: Self-pay

## 2023-10-16 ENCOUNTER — Ambulatory Visit
Admission: RE | Admit: 2023-10-16 | Discharge: 2023-10-16 | Disposition: A | Discharge status: Home / Self Care | Source: Ambulatory Visit | Attending: Radiation Oncology | Admitting: Radiation Oncology

## 2023-10-16 DIAGNOSIS — Z5111 Encounter for antineoplastic chemotherapy: Secondary | ICD-10-CM | POA: Diagnosis not present

## 2023-10-16 LAB — RAD ONC ARIA SESSION SUMMARY
Course Elapsed Days: 37
Plan Fractions Treated to Date: 26
Plan Prescribed Dose Per Fraction: 2 Gy
Plan Total Fractions Prescribed: 33
Plan Total Prescribed Dose: 66 Gy
Reference Point Dosage Given to Date: 52 Gy
Reference Point Session Dosage Given: 2 Gy
Session Number: 26

## 2023-10-16 MED ORDER — LANSOPRAZOLE 30 MG PO CPDR: 30.0000 mg | DELAYED_RELEASE_CAPSULE | Freq: Every day | ORAL | 0 refills | Status: DC

## 2023-10-16 MED ORDER — DEXAMETHASONE 2 MG PO TABS: 2.0000 mg | ORAL_TABLET | Freq: Two times a day (BID) | ORAL | 0 refills | Status: AC

## 2023-10-16 NOTE — Progress Notes (Addendum)
 Nutrition Assessment   Reason for Assessment:   Receiving chemotherapy   ASSESSMENT:  63 year old female with lung cancer.  Hospitalized for PE.  Past medical history of HLD, HTN, osteopenia.  Receiving carbotaxol and radiation.    Met with patient following radiation.  Reports that her appetite and intake has been good up until yesterday.  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  Says that she is using carafate  but does not seem to help.  Took mostly liquids yesterday.  Drank 3 fairlife shakes, soup and sausage roll.  Has tried some of the other shakes and likes fairlife the best so far.    Medications: carafate , compazine , decadron , KCL, zofran    Labs: reviewed   Anthropometrics:   Height:  64 inches Weight: 163 lb 12.8 oz on 7/7 168 lb on 6/9 170 lb on 5/30 178 lb on 3/25  BMI: 28  8% weight loss in the last 4 months, concerning  Estimated Energy Needs  Kcals: 1850-2200 Protein: 88-111 g Fluid: 1850-2200 ml   NUTRITION DIAGNOSIS: Inadequate oral intake related to cancer related treatment side effects as evidenced by 8% weight los in the last 4 months and decreased oral intake due to radiation side effects   INTERVENTION:  Encouraged 350 calories shake or higher if able to tolerate.  If not add ice cream, etc to Fairlife shake to increase calories.  Handout provided with recipes for shakes  Discussed soft foods high in calories and protein.  Handout provided Sample of ensure complete, boost [facility] and [employer] 1.4 given for patient to try. Walked patient back down to radiation oncology to see MD due to pain when swallowing.  Discussed [facility] with patient as well. Contact information provided   MONITORING, EVALUATION, GOAL: weight trends, intake   Next Visit: Thursday, July 17 after radiation  Antwone Capozzoli B. Dasie SOLON, CSO, LDN Registered Dietitian [phone]

## 2023-10-17 ENCOUNTER — Other Ambulatory Visit: Payer: Self-pay

## 2023-10-17 ENCOUNTER — Ambulatory Visit
Admission: RE | Admit: 2023-10-17 | Discharge: 2023-10-17 | Disposition: A | Discharge status: Home / Self Care | Source: Ambulatory Visit | Attending: Radiation Oncology | Admitting: Radiation Oncology

## 2023-10-17 DIAGNOSIS — Z5111 Encounter for antineoplastic chemotherapy: Secondary | ICD-10-CM | POA: Diagnosis not present

## 2023-10-17 LAB — RAD ONC ARIA SESSION SUMMARY
Course Elapsed Days: 38
Plan Fractions Treated to Date: 27
Plan Prescribed Dose Per Fraction: 2 Gy
Plan Total Fractions Prescribed: 33
Plan Total Prescribed Dose: 66 Gy
Reference Point Dosage Given to Date: 54 Gy
Reference Point Session Dosage Given: 2 Gy
Session Number: 27

## 2023-10-18 ENCOUNTER — Ambulatory Visit
Admission: RE | Admit: 2023-10-18 | Discharge: 2023-10-18 | Disposition: A | Discharge status: Home / Self Care | Source: Ambulatory Visit | Attending: Radiation Oncology | Admitting: Radiation Oncology

## 2023-10-18 ENCOUNTER — Other Ambulatory Visit: Payer: Self-pay

## 2023-10-18 DIAGNOSIS — Z5111 Encounter for antineoplastic chemotherapy: Secondary | ICD-10-CM | POA: Diagnosis not present

## 2023-10-18 LAB — RAD ONC ARIA SESSION SUMMARY
Course Elapsed Days: 39
Plan Fractions Treated to Date: 28
Plan Prescribed Dose Per Fraction: 2 Gy
Plan Total Fractions Prescribed: 33
Plan Total Prescribed Dose: 66 Gy
Reference Point Dosage Given to Date: 56 Gy
Reference Point Session Dosage Given: 2 Gy
Session Number: 28

## 2023-10-21 ENCOUNTER — Inpatient Hospital Stay (HOSPITAL_BASED_OUTPATIENT_CLINIC_OR_DEPARTMENT_OTHER): Admitting: Oncology

## 2023-10-21 ENCOUNTER — Inpatient Hospital Stay

## 2023-10-21 ENCOUNTER — Encounter: Payer: Self-pay | Admitting: Oncology

## 2023-10-21 ENCOUNTER — Other Ambulatory Visit: Payer: Self-pay

## 2023-10-21 ENCOUNTER — Encounter: Payer: Self-pay | Admitting: *Deleted

## 2023-10-21 ENCOUNTER — Ambulatory Visit
Admission: RE | Admit: 2023-10-21 | Discharge: 2023-10-21 | Disposition: A | Discharge status: Home / Self Care | Source: Ambulatory Visit | Attending: Radiation Oncology | Admitting: Radiation Oncology

## 2023-10-21 VITALS — BP 109/70 | HR 85 | Temp 97.6°F | Resp 20 | Wt 163.0 lb

## 2023-10-21 DIAGNOSIS — C3491 Malignant neoplasm of unspecified part of right bronchus or lung: Secondary | ICD-10-CM

## 2023-10-21 DIAGNOSIS — Z5111 Encounter for antineoplastic chemotherapy: Secondary | ICD-10-CM | POA: Diagnosis not present

## 2023-10-21 DIAGNOSIS — E876 Hypokalemia: Secondary | ICD-10-CM

## 2023-10-21 DIAGNOSIS — I2693 Single subsegmental pulmonary embolism without acute cor pulmonale: Secondary | ICD-10-CM

## 2023-10-21 LAB — RAD ONC ARIA SESSION SUMMARY
Course Elapsed Days: 42
Plan Fractions Treated to Date: 29
Plan Prescribed Dose Per Fraction: 2 Gy
Plan Total Fractions Prescribed: 33
Plan Total Prescribed Dose: 66 Gy
Reference Point Dosage Given to Date: 58 Gy
Reference Point Session Dosage Given: 2 Gy
Session Number: 29

## 2023-10-21 LAB — CMP (CANCER CENTER ONLY)
ALT: 23 U/L (ref 0–44)
AST: 19 U/L (ref 15–41)
Albumin: 3.8 g/dL (ref 3.5–5.0)
Alkaline Phosphatase: 53 U/L (ref 38–126)
Anion gap: 4 — ABNORMAL LOW (ref 5–15)
BUN: 24 mg/dL — ABNORMAL HIGH (ref 8–23)
CO2: 22 mmol/L (ref 22–32)
Calcium: 9 mg/dL (ref 8.9–10.3)
Chloride: 108 mmol/L (ref 98–111)
Creatinine: 0.88 mg/dL (ref 0.44–1.00)
GFR, Estimated: >60 mL/min (ref >60)
Glucose, Bld: 92 mg/dL (ref 70–99)
Potassium: 3.7 mmol/L (ref 3.5–5.1)
Sodium: 134 mmol/L — ABNORMAL LOW (ref 135–145)
Total Bilirubin: 0.7 mg/dL (ref 0.0–1.2)
Total Protein: 6.4 g/dL — ABNORMAL LOW (ref 6.5–8.1)

## 2023-10-21 LAB — CBC WITH DIFFERENTIAL (CANCER CENTER ONLY)
Abs Immature Granulocytes: 0.03 K/uL (ref 0.00–0.07)
Basophils Absolute: 0 K/uL (ref 0.0–0.1)
Basophils Relative: 1 %
Eosinophils Absolute: 0 K/uL (ref 0.0–0.5)
Eosinophils Relative: 1 %
HCT: 30.8 % — ABNORMAL LOW (ref 36.0–46.0)
Hemoglobin: 10.5 g/dL — ABNORMAL LOW (ref 12.0–15.0)
Immature Granulocytes: 2 %
Lymphocytes Relative: 25 %
Lymphs Abs: 0.5 K/uL — ABNORMAL LOW (ref 0.7–4.0)
MCH: 28.4 pg (ref 26.0–34.0)
MCHC: 34.1 g/dL (ref 30.0–36.0)
MCV: 83.2 fL (ref 80.0–100.0)
Monocytes Absolute: 0.1 K/uL (ref 0.1–1.0)
Monocytes Relative: 7 %
Neutro Abs: 1.3 K/uL — ABNORMAL LOW (ref 1.7–7.7)
Neutrophils Relative %: 64 %
Platelet Count: 216 K/uL (ref 150–400)
RBC: 3.7 MIL/uL — ABNORMAL LOW (ref 3.87–5.11)
RDW: 14.4 % (ref 11.5–15.5)
WBC Count: 1.9 K/uL — ABNORMAL LOW (ref 4.0–10.5)
nRBC: 0 % (ref 0.0–0.2)

## 2023-10-21 MED ORDER — DIPHENHYDRAMINE HCL 50 MG/ML IJ SOLN
50.0000 mg | Freq: Once | INTRAMUSCULAR | Status: AC
  Administered 2023-10-21: 50 mg via INTRAVENOUS
  Filled 2023-10-21: qty 1

## 2023-10-21 MED ORDER — PALONOSETRON HCL INJECTION 0.25 MG/5ML
0.2500 mg | Freq: Once | INTRAVENOUS | Status: AC
  Administered 2023-10-21: 0.25 mg via INTRAVENOUS
  Filled 2023-10-21: qty 5

## 2023-10-21 MED ORDER — FAMOTIDINE IN NACL 20-0.9 MG/50ML-% IV SOLN
20.0000 mg | Freq: Once | INTRAVENOUS | Status: AC
  Administered 2023-10-21: 20 mg via INTRAVENOUS
  Filled 2023-10-21: qty 50

## 2023-10-21 MED ORDER — SODIUM CHLORIDE 0.9 % IV SOLN
231.6000 mg | Freq: Once | INTRAVENOUS | Status: AC
  Administered 2023-10-21: 230 mg via INTRAVENOUS
  Filled 2023-10-21: qty 23

## 2023-10-21 MED ORDER — HEPARIN SOD (PORK) LOCK FLUSH 100 UNIT/ML IV SOLN
500.0000 [IU] | Freq: Once | INTRAVENOUS | Status: AC | PRN
Start: 2023-10-21 — End: 2023-10-21
  Administered 2023-10-21: 500 [IU]
  Filled 2023-10-21: qty 5

## 2023-10-21 MED ORDER — DEXAMETHASONE SODIUM PHOSPHATE 10 MG/ML IJ SOLN
10.0000 mg | Freq: Once | INTRAMUSCULAR | Status: AC
  Administered 2023-10-21: 10 mg via INTRAVENOUS
  Filled 2023-10-21: qty 1

## 2023-10-21 MED ORDER — SODIUM CHLORIDE 0.9 % IV SOLN
45.0000 mg/m2 | Freq: Once | INTRAVENOUS | Status: AC
  Administered 2023-10-21: 84 mg via INTRAVENOUS
  Filled 2023-10-21: qty 14

## 2023-10-21 MED ORDER — SODIUM CHLORIDE 0.9 % IV SOLN
INTRAVENOUS | Status: DC
  Filled 2023-10-21: qty 250

## 2023-10-21 NOTE — Assessment & Plan Note (Signed)
?   Anticoagulation with Eliquis 5 mg BID

## 2023-10-21 NOTE — Patient Instructions (Signed)
 [facility] - A DEPT OF [facility]  Discharge Instructions: Thank you for choosing [facility] to provide your oncology and hematology care.  If you have a lab appointment with the Cancer Center, please go directly to the Cancer Center and check in at the registration area.  Wear comfortable clothing and clothing appropriate for easy access to any Portacath or PICC line.   We strive to give you quality time with your provider. You may need to reschedule your appointment if you arrive late (15 or more minutes).  Arriving late affects you and other patients whose appointments are after yours.  Also, if you miss three or more appointments without notifying the office, you may be dismissed from the clinic at the provider's discretion.      For prescription refill requests, have your pharmacy contact our office and allow 72 hours for refills to be completed.    Today you received the following chemotherapy and/or immunotherapy agents Taxol & Carboplatin      To help prevent nausea and vomiting after your treatment, we encourage you to take your nausea medication as directed.  BELOW ARE SYMPTOMS THAT SHOULD BE REPORTED IMMEDIATELY: *FEVER GREATER THAN 100.4 F (38 C) OR HIGHER *CHILLS OR SWEATING *NAUSEA AND VOMITING THAT IS NOT CONTROLLED WITH YOUR NAUSEA MEDICATION *UNUSUAL SHORTNESS OF BREATH *UNUSUAL BRUISING OR BLEEDING *URINARY PROBLEMS (pain or burning when urinating, or frequent urination) *BOWEL PROBLEMS (unusual diarrhea, constipation, pain near the anus) TENDERNESS IN MOUTH AND THROAT WITH OR WITHOUT PRESENCE OF ULCERS (sore throat, sores in mouth, or a toothache) UNUSUAL RASH, SWELLING OR PAIN  UNUSUAL VAGINAL DISCHARGE OR ITCHING   Items with * indicate a potential emergency and should be followed up as soon as possible or go to the Emergency Department if any problems should occur.  Please show the CHEMOTHERAPY ALERT CARD or  IMMUNOTHERAPY ALERT CARD at check-in to the Emergency Department and triage nurse.  Should you have questions after your visit or need to cancel or reschedule your appointment, please contact [facility] - A DEPT OF [facility]  [phone] and follow the prompts.  Office hours are 8:00 a.m. to 4:30 p.m. Monday - Friday. Please note that voicemails left after 4:00 p.m. may not be returned until the following business day.  We are closed weekends and major holidays. You have access to a nurse at all times for urgent questions. Please call the main number to the clinic [phone] and follow the prompts.  For any non-urgent questions, you may also contact your provider using MyChart. We now offer e-Visits for anyone 67 and older to request care online for non-urgent symptoms. For details visit [email or web address].   Also download the MyChart app! Go to the app store, search "MyChart", open the app, select [facility], and log in with your MyChart username and password.

## 2023-10-21 NOTE — Progress Notes (Signed)
 Hematology/Oncology Progress note [phone] [phone]       CHIEF COMPLAINTS/PURPOSE OF CONSULTATION:  Stage III lung adenocarcinoam  ASSESSMENT & PLAN:   Cancer Staging  Primary lung adenocarcinoma, right [facility]) Staging form: Lung, AJCC V9 - Clinical stage from 08/15/2023: [name], cM0 - Signed by Babara Call, MD on 08/15/2023   Primary lung adenocarcinoma, right [facility]) Images and pathology results were reviewed with the patient. cT2 N2 M0, Stage III right lung adenocarcinoma.  NGS showed TPS 5% CPS 10  KRAS G12C, TMB 12.6  Labs are reviewed and discussed with patient. Proceed with carboplatin  AUC 2, Taxol  45mg /m2  Repeat CT chest w contrast 4 weeks after final RT.  Plan maintenance Durvalumab Q2weeks for 1 year  Encounter for antineoplastic chemotherapy Chemotherapy treatment as planned above  Hypocalcemia conitnue calcium  supplementation 600mg  daily  Hypokalemia Potassium chloride  20meq daily for another 10 days, then stop  Pulmonary embolism (HCC) Anticoagulation with Eliquis  5mg  BID.     Orders Placed This Encounter  Procedures   CT Chest W Contrast    Standing Status:   Future    Expected Date:   11/22/2023    Expiration Date:   10/20/2024    If indicated for the ordered procedure, I authorize the administration of contrast media per Radiology protocol:   Yes    Does the patient have a contrast media/X-ray dye allergy?:   No    Preferred imaging location?:   [facility]   Follow-up 4-5 week All questions were answered. The patient knows to call the clinic with any problems, questions or concerns.  Call Babara, MD, PhD [facility] Hematology Oncology 10/21/2023    HISTORY OF PRESENTING ILLNESS:  Tanya Howell 63 y.o. female presents to establish care for lung cancer  Oncology History  Primary lung adenocarcinoma, right (HCC)  08/03/2023 Imaging   CT angiogram chest PE protocol showed  1. 3.5 x 2.6 cm right hilar mass, with  obstruction of the bronchus intermedius, right middle lobe bronchus, and portions of the right lower lobe bronchus as above, highly concerning for neoplasm. Bronchoscopy is recommended for further evaluation. 2. Subcarinal adenopathy concerning for metastatic disease. 3. Dense medial segment consolidation within the right middle lobe with associated volume loss, consistent with atelectasis. 4. Prominent right lower lobe bronchiectasis, with fluid-filled bronchi and patchy right basilar consolidation consistent with combination of airspace disease and atelectasis. 5. No evidence of pulmonary embolus. 6. Aortic Atherosclerosis (ICD10-I70.0). Coronary artery atherosclerosis.   08/06/2023 Initial Diagnosis   Primary lung adenocarcinoma, right (HCC)  08/03/2023, patient presented to emergency room for evaluation of hemoptysis. Patient had pneumonia in March 2025, she continues to have a lingering cough for the past months despite being treated with doxycycline  and prednisone . She coughed up small amount of bright red blood on 08/03/2023.  Denies any shortness of breath, chest pain unintentional weight loss headache.  She felt feverish with chills 2 nights ago. Patient is a former smoker, quit smoking in 2011. Patient denies being on any antiplatelet or anticoagulation agents.  08/08/2023 She underwent biopsy via bronchoscopy.  FINAL MICROSCOPIC DIAGNOSIS:  A. LUNG, RLL, BRUSHING:  - Adenocarcinoma   B. LUNG, RLL, FINE NEEDLE ASPIRATION:  - Adenocarcinoma   Immunohistochemical stains were performed to characterize the tumor cells. The cells are negative for TTF-1, Napsin A, p40, synaptophysin, Chromogranin A, and CD56.  Mucicarmine stain highlights intracytoplasmic mucin. Ki67 proliferation index is approximately 30%. The overall findings are supportive of the diagnosis of adenocarcinoma.  C. LYMPH NODE, 7, FINE NEEDLE ASPIRATION:  - Adenocarcinoma  - Lymphoid tissue present   D. LYMPH  NODE, 4R, FINE NEEDLE ASPIRATION:  - No malignant cells identified  - Lymphoid tissue present   E. LYMPH NODE, 11R, FINE NEEDLE ASPIRATION:  - No malignant cells identified  - Lymphoid tissue present   F. LUNG, RLL, LAVAGE:  FINAL MICROSCOPIC DIAGNOSIS:  - Atypical cells present    08/15/2023 Cancer Staging   Staging form: Lung, AJCC V9 - Clinical stage from 08/15/2023: [name], cM0 - Signed by Babara Call, MD on 08/15/2023 Stage prefix: Initial diagnosis Method of lymph node assessment: Clinical   09/09/2023 -  Chemotherapy   Patient is on Treatment Plan : LUNG Carboplatin  + Paclitaxel  + XRT q7d      She feels that cough has improved.  Palpitation has been better. She has no new concerns.  No additional episodes of hemoptysis. No additional episodes of hemoptysis.    MEDICAL HISTORY:  Past Medical History:  Diagnosis Date   COVID-19 07/2018   Hyperlipidemia    Hypertension    Lung cancer (HCC) 07/2023   Motion sickness    amuesment park rides   Osteopenia 11/01/2015   Pneumonia    april 2025   Psoriasis    Vertigo    none for over 5 yrs   Wears dentures    full upper and lower    SURGICAL HISTORY: Past Surgical History:  Procedure Laterality Date   BRONCHIAL BIOPSY  08/08/2023   Procedure: BRONCHOSCOPY, WITH BIOPSY;  Surgeon: Malka Domino, MD;  Location: [facility] ENDOSCOPY;  Service: Pulmonary;;   BRONCHIAL BRUSHINGS  08/08/2023   Procedure: BRONCHOSCOPY, WITH BRUSH BIOPSY;  Surgeon: Malka Domino, MD;  Location: [facility] ENDOSCOPY;  Service: Pulmonary;;   BRONCHIAL NEEDLE ASPIRATION BIOPSY  08/08/2023   Procedure: BRONCHOSCOPY, WITH NEEDLE ASPIRATION BIOPSY;  Surgeon: Malka Domino, MD;  Location: [facility] ENDOSCOPY;  Service: Pulmonary;;   CHOLECYSTECTOMY  1990   COLONOSCOPY WITH PROPOFOL  N/A 07/15/2020   Procedure: COLONOSCOPY WITH PROPOFOL ;  Surgeon: Jinny Carmine, MD;  Location[facility];  Service: Endoscopy;  Laterality: N/A;  priority 4   ENDOBRONCHIAL  ULTRASOUND Bilateral 08/08/2023   Procedure: ENDOBRONCHIAL ULTRASOUND (EBUS);  Surgeon: Malka Domino, MD;  Location: [facility] ENDOSCOPY;  Service: Pulmonary;  Laterality: Bilateral;   IR IMAGING GUIDED PORT INSERTION  08/29/2023   TUBAL LIGATION      SOCIAL HISTORY: Social History   Socioeconomic History   Marital status: Married    Spouse name: Oneil   Number of children: 1   Years of education: Not on file   Highest education level: High school graduate  Occupational History   Not on file  Tobacco Use   Smoking status: Former    Current packs/day: 0.00    Average packs/day: 1 pack/day for 28.0 years (28.0 ttl pk-yrs)    Types: Cigarettes    Start date: 25    Quit date: 2011    Years since quitting: 14.5   Smokeless tobacco: Never   Tobacco comments:    Started smoking around 63 yrs old.    Smoked 1PPD at her heaviest.    Quit smoking in 2011- leda 08/07/2023  Vaping Use   Vaping status: Former  Substance and Sexual Activity   Alcohol use: No    Alcohol/week: 0.0 standard drinks of alcohol    Comment: rare   Drug use: No   Sexual activity: Yes    Birth control/protection: Surgical  Other Topics Concern  Not on file  Social History Narrative   Not on file   Social Drivers of Health   Financial Resource Strain: Low Risk  (05/06/2018)   Overall Financial Resource Strain (CARDIA)    Difficulty of Paying Living Expenses: Not hard at all  Food Insecurity: No Food Insecurity (09/24/2023)   Hunger Vital Sign    Worried About Running Out of Food in the Last Year: Never true    Ran Out of Food in the Last Year: Never true  Transportation Needs: No Transportation Needs (09/24/2023)   PRAPARE - Administrator, Civil Service (Medical): No    Lack of Transportation (Non-Medical): No  Physical Activity: Sufficiently Active (05/06/2018)   Exercise Vital Sign    Days of Exercise per Week: 7 days    Minutes of Exercise per Session: 30 min  Stress: No Stress Concern  Present (05/06/2018)   [employer] of Occupational Health - Occupational Stress Questionnaire    Feeling of Stress : Not at all  Social Connections: Moderately Isolated (09/24/2023)   Social Connection and Isolation Panel    Frequency of Communication with Friends and Family: More than three times a week    Frequency of Social Gatherings with Friends and Family: Twice a week    Attends Religious Services: Never    Database administrator or Organizations: No    Attends Banker Meetings: Never    Marital Status: Married  Catering manager Violence: Not At Risk (09/24/2023)   Humiliation, Afraid, Rape, and Kick questionnaire    Fear of Current or Ex-Partner: No    Emotionally Abused: No    Physically Abused: No    Sexually Abused: No    FAMILY HISTORY: Family History  Problem Relation Age of Onset   Colon cancer Brother        10    Breast cancer Mother    Breast cancer Maternal Aunt    Pneumonia Father    Diabetes Maternal Grandmother     ALLERGIES:  is allergic to lisinopril .  MEDICATIONS:  Current Outpatient Medications  Medication Sig Dispense Refill   apixaban  (ELIQUIS ) 5 MG TABS tablet Take 1 tablet (5 mg total) by mouth 2 (two) times daily. 60 tablet 4   atorvastatin  (LIPITOR) 10 MG tablet TAKE 1 TABLET BY MOUTH AT BEDTIME 90 tablet 1   benzonatate  (TESSALON  PERLES) 100 MG capsule Take 1 capsule (100 mg total) by mouth 2 (two) times daily as needed for cough. 30 capsule 1   dexamethasone  (DECADRON ) 2 MG tablet Take 1 tablet (2 mg total) by mouth 2 (two) times daily with a meal. 60 tablet 0   dexamethasone  (DECADRON ) 4 MG tablet Take 2 tablets daily for 2 days, start the day after chemotherapy. Take with food. 30 tablet 1   guaifenesin  (ROBITUSSIN) 100 MG/5ML syrup Take 200 mg by mouth 3 (three) times daily as needed for cough.     guaiFENesin -codeine  100-10 MG/5ML syrup Take 5 mLs by mouth every 6 (six) hours as needed for cough. 118 mL 0   lansoprazole   (PREVACID ) 30 MG capsule Take 1 capsule (30 mg total) by mouth daily at 12 noon. 30 capsule 0   levalbuterol  (XOPENEX  HFA) 45 MCG/ACT inhaler Inhale 2 puffs into the lungs every 6 (six) hours as needed for wheezing. 15 g 3   lidocaine -prilocaine  (EMLA ) cream Apply to affected area once 30 g 3   losartan  (COZAAR ) 25 MG tablet Take 1/2 (one-half) tablet by mouth once daily  45 tablet 0   meclizine  (ANTIVERT ) 25 MG tablet Take 1 tablet (25 mg total) by mouth 3 (three) times daily as needed for dizziness. 30 tablet 3   ondansetron  (ZOFRAN ) 8 MG tablet Take 1 tablet (8 mg total) by mouth every 8 (eight) hours as needed for nausea or vomiting. Start on the third day after chemotherapy. 30 tablet 1   potassium chloride  SA (KLOR-CON  M) 20 MEQ tablet Take 1 tablet (20 mEq total) by mouth daily. 30 tablet 0   prochlorperazine  (COMPAZINE ) 10 MG tablet Take 1 tablet (10 mg total) by mouth every 6 (six) hours as needed for nausea or vomiting. 30 tablet 1   sucralfate  (CARAFATE ) 1 g tablet Take 1 tablet (1 g total) by mouth 3 (three) times daily before meals. 90 tablet 1   triamcinolone  ointment (KENALOG ) 0.5 % Apply 1 Application topically 2 (two) times daily. 30 g 6   No current facility-administered medications for this visit.   Facility-Administered Medications Ordered in Other Visits  Medication Dose Route Frequency Provider Last Rate Last Admin   0.9 %  sodium chloride  infusion   Intravenous Continuous Babara Call, MD 10 mL/hr at 10/21/23 0950 New Bag at 10/21/23 0950   CARBOplatin  (PARAPLATIN ) 230 mg in sodium chloride  0.9 % 100 mL chemo infusion  230 mg Intravenous Once Babara Call, MD 246 mL/hr at 10/21/23 1210 230 mg at 10/21/23 1210   heparin  lock flush 100 unit/mL  500 Units Intracatheter Once PRN Babara Call, MD        Review of Systems  Constitutional:  Negative for appetite change, chills, fatigue and fever.  HENT:   Negative for hearing loss and voice change.   Eyes:  Negative for eye problems.   Respiratory:  Positive for cough. Negative for chest tightness and hemoptysis.   Cardiovascular:  Negative for chest pain.  Gastrointestinal:  Negative for abdominal distention, abdominal pain and blood in stool.  Endocrine: Negative for hot flashes.  Genitourinary:  Negative for difficulty urinating and frequency.   Musculoskeletal:  Negative for arthralgias.  Skin:  Negative for itching and rash.  Neurological:  Negative for extremity weakness.  Hematological:  Negative for adenopathy.  Psychiatric/Behavioral:  Negative for confusion.      PHYSICAL EXAMINATION: ECOG PERFORMANCE STATUS: 0 - Asymptomatic  Vitals:   10/21/23 0913  BP: 109/70  Pulse: 85  Resp: 20  Temp: 97.6 F (36.4 C)  SpO2: 100%   Filed Weights   10/21/23 0913  Weight: 163 lb (73.9 kg)    Physical Exam Constitutional:      General: She is not in acute distress.    Appearance: She is not diaphoretic.  HENT:     Head: Normocephalic and atraumatic.  Eyes:     General: No scleral icterus. Cardiovascular:     Rate and Rhythm: Normal rate and regular rhythm.     Heart sounds: No murmur heard. Pulmonary:     Effort: Pulmonary effort is normal. No respiratory distress.     Breath sounds: No wheezing.  Abdominal:     General: There is no distension.     Palpations: Abdomen is soft.     Tenderness: There is no abdominal tenderness.  Musculoskeletal:        General: Normal range of motion.     Cervical back: Normal range of motion and neck supple.  Skin:    General: Skin is warm and dry.     Findings: No erythema.  Neurological:  Mental Status: She is alert and oriented to person, place, and time. Mental status is at baseline.     Motor: No abnormal muscle tone.  Psychiatric:        Mood and Affect: Affect normal.      LABORATORY DATA:  I have reviewed the data as listed    Latest Ref Rng & Units 10/21/2023    8:57 AM 10/14/2023    8:54 AM 10/07/2023    8:56 AM  CBC  WBC 4.0 - 10.5 K/uL  1.9  3.2  4.6   Hemoglobin 12.0 - 15.0 g/dL 89.4  89.3  88.7   Hematocrit 36.0 - 46.0 % 30.8  31.8  33.2   Platelets 150 - 400 K/uL 216  151  120       Latest Ref Rng & Units 10/21/2023    8:57 AM 10/14/2023    8:54 AM 10/07/2023    8:56 AM  CMP  Glucose 70 - 99 mg/dL 92  872  95   BUN 8 - 23 mg/dL 24  20  16    Creatinine 0.44 - 1.00 mg/dL 9.11  9.00  9.25   Sodium 135 - 145 mmol/L 134  136  134   Potassium 3.5 - 5.1 mmol/L 3.7  3.3  3.8   Chloride 98 - 111 mmol/L 108  105  103   CO2 22 - 32 mmol/L 22  23  23    Calcium  8.9 - 10.3 mg/dL 9.0  9.0  8.6   Total Protein 6.5 - 8.1 g/dL 6.4  6.1  6.4   Total Bilirubin 0.0 - 1.2 mg/dL 0.7  0.7  0.6   Alkaline Phos 38 - 126 U/L 53  52  56   AST 15 - 41 U/L 19  22  19    ALT 0 - 44 U/L 23  25  24       RADIOGRAPHIC STUDIES: I have personally reviewed the radiological images as listed and agreed with the findings in the report. CT Angio Chest PE W and/or Wo Contrast Result Date: 09/23/2023 CLINICAL DATA:  Hemoptysis.  Lung cancer.  * Tracking Code: BO * EXAM: CT ANGIOGRAPHY CHEST WITH CONTRAST TECHNIQUE: Multidetector CT imaging of the chest was performed using the standard protocol during bolus administration of intravenous contrast. Multiplanar CT image reconstructions and MIPs were obtained to evaluate the vascular anatomy. RADIATION DOSE REDUCTION: This exam was performed according to the departmental dose-optimization program which includes automated exposure control, adjustment of the mA and/or kV according to patient size and/or use of iterative reconstruction technique. CONTRAST:  75mL OMNIPAQUE  IOHEXOL  350 MG/ML SOLN COMPARISON:  08/03/2023. FINDINGS: Cardiovascular: There are filling defects in the lingular segmental bronchi (6/114-123). Atherosclerotic calcification of the aorta and circumflex coronary artery. Heart is enlarged. No pericardial effusion. Right IJ Port-A-Cath terminates at the SVC RA junction. Mediastinum/Nodes: Subcarinal and  right hilar adenopathy measures 2.5 cm and 3.2 cm, respectively, increased in size from 2.0 cm and 2.6 cm, respectively, on 08/03/2023. No left hilar or axillary adenopathy. Esophagus is grossly unremarkable. Lungs/Pleura: Right hilar adenopathy markedly narrows the right middle and right lower lobe bronchi, creating endobronchial debris and airspace consolidation in the right middle and right lower lobes, increased from 07/14/2023. No pleural fluid. Airway is otherwise unremarkable. Upper Abdomen: Cholecystectomy. Visualized portions of the liver, adrenal glands, kidneys, spleen, pancreas, stomach and bowel are otherwise grossly unremarkable. No upper abdominal adenopathy. Musculoskeletal: Degenerative changes in the spine. Review of the MIP images confirms the above findings.  IMPRESSION: 1. Segmental pulmonary emboli in the lingula. Critical Value/emergent results were called by telephone at the time of interpretation on 09/23/2023 at 6:49 pm to provider JENISE MENSHEW, who verbally acknowledged these results. 2. Enlarging right hilar and subcarinal nodal masses, compatible with biopsy-proven adenocarcinoma. Associated marked narrowing of the right middle and right lower lobe bronchi with endobronchial debris and postobstructive airspace consolidation in the right middle and right lower lobes. 3. Aortic atherosclerosis (ICD10-I70.0). Circumflex coronary artery calcification. Electronically Signed   By: Newell Eke M.D.   On: 09/23/2023 18:50

## 2023-10-21 NOTE — Progress Notes (Signed)
 DISCONTINUE ON PATHWAY REGIMEN - Non-Small Cell Lung     A cycle is every 7 days, concurrent with RT:     Paclitaxel       Carboplatin    **Always confirm dose/schedule in your pharmacy ordering system**  REASON: Continuation Of Treatment PRIOR TREATMENT: [id number]: Carboplatin  AUC=2 + Paclitaxel  45 mg/m2 Weekly During Radiation TREATMENT RESPONSE: Stable Disease (SD)  START ON PATHWAY REGIMEN - Non-Small Cell Lung     A cycle is every 14 days:     Durvalumab   **Always confirm dose/schedule in your pharmacy ordering system**  Patient Characteristics: Preoperative or Nonsurgical Candidate (Clinical Staging), Stage IIB (N2a only) or Stage III - Nonsurgical Candidate, PS = 0,1 Therapeutic Status: Preoperative or Nonsurgical Candidate (Clinical Staging) AJCC T Category: cT2a AJCC N Category: cN2a AJCC M Category: cM0 AJCC 9 Stage Grouping: IIIA Check here if patient was staged using an edition other than AJCC Staging 9th Edition: true ECOG Performance Status: 0 Intent of Therapy: Curative Intent, Discussed with Patient

## 2023-10-21 NOTE — Assessment & Plan Note (Addendum)
 Images and pathology results were reviewed with the patient. cT2 N2 M0, Stage III right lung adenocarcinoma.  NGS showed TPS 5% CPS 10  KRAS G12C, TMB 12.6  Labs are reviewed and discussed with patient. Proceed with carboplatin  AUC 2, Taxol  45mg /m2  Repeat CT chest w contrast 4 weeks after final RT.  Plan maintenance Durvalumab Q2weeks for 1 year

## 2023-10-21 NOTE — Assessment & Plan Note (Signed)
 Chemotherapy treatment as planned above

## 2023-10-21 NOTE — Assessment & Plan Note (Signed)
 Potassium chloride  20meq daily for another 10 days, then stop

## 2023-10-21 NOTE — Assessment & Plan Note (Signed)
 conitnue calcium  supplementation 600mg  daily

## 2023-10-22 ENCOUNTER — Ambulatory Visit
Admission: RE | Admit: 2023-10-22 | Discharge: 2023-10-22 | Disposition: A | Discharge status: Home / Self Care | Source: Ambulatory Visit | Attending: Radiation Oncology | Admitting: Radiation Oncology

## 2023-10-22 ENCOUNTER — Other Ambulatory Visit: Payer: Self-pay

## 2023-10-22 DIAGNOSIS — Z5111 Encounter for antineoplastic chemotherapy: Secondary | ICD-10-CM | POA: Diagnosis not present

## 2023-10-22 LAB — RAD ONC ARIA SESSION SUMMARY
Course Elapsed Days: 43
Plan Fractions Treated to Date: 30
Plan Prescribed Dose Per Fraction: 2 Gy
Plan Total Fractions Prescribed: 33
Plan Total Prescribed Dose: 66 Gy
Reference Point Dosage Given to Date: 60 Gy
Reference Point Session Dosage Given: 2 Gy
Session Number: 30

## 2023-10-23 ENCOUNTER — Ambulatory Visit
Admission: RE | Admit: 2023-10-23 | Discharge: 2023-10-23 | Disposition: A | Discharge status: Home / Self Care | Source: Ambulatory Visit | Attending: Radiation Oncology | Admitting: Radiation Oncology

## 2023-10-23 ENCOUNTER — Other Ambulatory Visit: Payer: Self-pay

## 2023-10-23 DIAGNOSIS — Z5111 Encounter for antineoplastic chemotherapy: Secondary | ICD-10-CM | POA: Diagnosis not present

## 2023-10-23 LAB — RAD ONC ARIA SESSION SUMMARY
Course Elapsed Days: 44
Plan Fractions Treated to Date: 31
Plan Prescribed Dose Per Fraction: 2 Gy
Plan Total Fractions Prescribed: 33
Plan Total Prescribed Dose: 66 Gy
Reference Point Dosage Given to Date: 62 Gy
Reference Point Session Dosage Given: 2 Gy
Session Number: 31

## 2023-10-24 ENCOUNTER — Ambulatory Visit

## 2023-10-24 ENCOUNTER — Ambulatory Visit
Admission: RE | Admit: 2023-10-24 | Discharge: 2023-10-24 | Disposition: A | Discharge status: Home / Self Care | Source: Ambulatory Visit | Attending: Radiation Oncology | Admitting: Radiation Oncology

## 2023-10-24 ENCOUNTER — Inpatient Hospital Stay

## 2023-10-24 ENCOUNTER — Other Ambulatory Visit: Payer: Self-pay

## 2023-10-24 DIAGNOSIS — Z5111 Encounter for antineoplastic chemotherapy: Secondary | ICD-10-CM | POA: Diagnosis not present

## 2023-10-24 LAB — RAD ONC ARIA SESSION SUMMARY
Course Elapsed Days: 45
Plan Fractions Treated to Date: 32
Plan Prescribed Dose Per Fraction: 2 Gy
Plan Total Fractions Prescribed: 33
Plan Total Prescribed Dose: 66 Gy
Reference Point Dosage Given to Date: 64 Gy
Reference Point Session Dosage Given: 2 Gy
Session Number: 32

## 2023-10-24 NOTE — Progress Notes (Signed)
 Nutrition Follow-up:  Patient with lung cancer.  Received last chemo on 7/14 and last radiation planned for 7/18.  Patient will start immunotherapy following concurrent chemo and radiation.  Met with patient following radiation.  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  Taking decadron .  Says that on the bad days she does more soft foods (macaroni and cheese with extra cheese, pudding, soups) and drinks mostly [employer] shakes with ice cream.  Does not really like the higher calorie shakes but has drank them.      Medications: decadron , carafate   Labs: reviewed  Anthropometrics:   Weight 163 lb on 7/14  163 lb 12.8 oz on 7/7 168 lb on 6/9 170 lb on 5/30 178 lb on 3/25   NUTRITION DIAGNOSIS: Inadequate oral intake better   INTERVENTION:  Continue oral nutrition supplement for added calories and protein Continue high calorie, soft foods to help maintain weight while heals from radiation.     MONITORING, EVALUATION, GOAL: weight trends, intake   NEXT VISIT: Friday, Aug 22 during infusion  Lambros Cerro B. Dasie SOLON, CSO, LDN Registered Dietitian [phone]

## 2023-10-25 ENCOUNTER — Ambulatory Visit
Admission: RE | Admit: 2023-10-25 | Discharge: 2023-10-25 | Disposition: A | Discharge status: Home / Self Care | Source: Ambulatory Visit | Attending: Radiation Oncology | Admitting: Radiation Oncology

## 2023-10-25 ENCOUNTER — Other Ambulatory Visit: Payer: Self-pay

## 2023-10-25 DIAGNOSIS — Z5111 Encounter for antineoplastic chemotherapy: Secondary | ICD-10-CM | POA: Diagnosis not present

## 2023-10-25 LAB — RAD ONC ARIA SESSION SUMMARY
Course Elapsed Days: 46
Plan Fractions Treated to Date: 33
Plan Prescribed Dose Per Fraction: 2 Gy
Plan Total Fractions Prescribed: 33
Plan Total Prescribed Dose: 66 Gy
Reference Point Dosage Given to Date: 66 Gy
Reference Point Session Dosage Given: 2 Gy
Session Number: 33

## 2023-10-28 NOTE — Radiation Completion Notes (Signed)
 Patient Name: Tanya Howell, ZAUGG MRN: [id number] Date of Birth: 10-28-1960 Referring Physician: ZELPHIA CAP, M.D. Date of Service: 2023-10-28 Radiation Oncologist: [name], M.D. [facility]                             RADIATION ONCOLOGY END OF TREATMENT NOTE     Diagnosis: C34.01 Malignant neoplasm of right main bronchus Staging on 2023-08-15: Primary lung adenocarcinoma, right (HCC) T=cT2a, N=cN2, M=cM0 Intent: Curative     HPI: Patient is a 63 year old female who presented with pneumonia and hemoptysis.  Her initial CT scan showed a 3.5 x 2.6 right hilar mass with obstruction of the bronchus intermedius right middle lobe bronchus and portions of the right lower lobe bronchus concerning for neoplasm.  She also had subcarinal adenopathy concerning for metastatic disease.  She underwent bronchoscopy which was positive for adenocarcinoma.  Level 7 fine-needle aspiration of lymph node was also positive for adenocarcinoma.  PET/CT was performed showing right hilar mass hypermetabolic consistent with primary bronchogenic carcinoma.  She also had adjacent hypermetabolic subcarinal and right paratracheal lymph nodes involved consistent with metastatic disease.  There is also focal hypermetabolic cavity in the periphery in the anterior aspect of the right lower lobe probably postobstructive.  MRI of the brain showed no evidence of metastatic disease.  She has been seen by medical oncology.  She continues to have a cough no hemoptysis.  She has no dysphagia or marked shortness of breath is lost no weight.  She is seen today for radiation oncology opinion.      ==========DELIVERED PLANS==========  First Treatment Date: 2023-09-09 Last Treatment Date: 2023-10-25   Plan Name: Lung_R Site: Lung, Right Technique: IMRT Mode: Photon Dose Per Fraction: 2 Gy Prescribed Dose (Delivered / Prescribed): 66 Gy / 66 Gy Prescribed Fxs (Delivered / Prescribed): 33 / 33      ==========ON TREATMENT VISIT DATES========== 2023-09-10, 2023-09-18, 2023-10-01, 2023-10-03, 2023-10-08, 2023-10-15, 2023-10-16, 2023-10-22, 2023-10-24     ==========UPCOMING VISITS==========       ==========APPENDIX - ON TREATMENT VISIT NOTES==========   See weekly On Treatment Notes in Epic for details in the Media tab (listed as Progress notes on the On Treatment Visit Dates listed above).

## 2023-10-30 ENCOUNTER — Other Ambulatory Visit: Payer: Self-pay | Admitting: Oncology

## 2023-10-30 ENCOUNTER — Telehealth: Payer: Self-pay | Admitting: *Deleted

## 2023-10-30 MED ORDER — MORPHINE SULFATE (CONCENTRATE) 10 MG /0.5 ML PO SOLN: 10.0000 mg | ORAL | 0 refills | Status: DC | PRN

## 2023-10-30 MED ORDER — NYSTATIN 100000 UNIT/ML MT SUSP: 5.0000 mL | Freq: Three times a day (TID) | OROMUCOSAL | 1 refills | Status: DC | PRN

## 2023-10-30 NOTE — Telephone Encounter (Signed)
 Pt called in with complaints of difficulty swallowing due to pain most likely related to radiation esophagitis. Pt has been taking carafate , decadron , prevacid , and tylenol  which has not been helping relieve her pain. She is asking for further recommendations. Per Dr. Babara, will send in prescription for magic mouthwash and liquid morphine . Pt made aware and instructed to call back with any further questions or needs. Pt verbalized understanding.

## 2023-10-31 ENCOUNTER — Other Ambulatory Visit: Payer: Self-pay | Admitting: Family Medicine

## 2023-10-31 DIAGNOSIS — E785 Hyperlipidemia, unspecified: Secondary | ICD-10-CM

## 2023-11-01 NOTE — Telephone Encounter (Signed)
 Lipid panel in date.  Requested Prescriptions  Pending Prescriptions Disp Refills   atorvastatin  (LIPITOR) 10 MG tablet [Pharmacy Med Name: Atorvastatin  Calcium  10 MG Oral Tablet] 90 tablet 0    Sig: TAKE 1 TABLET BY MOUTH AT BEDTIME     Cardiovascular:  Antilipid - Statins Failed - 11/01/2023  1:14 PM      Failed - Lipid Panel in normal range within the last 12 months    Cholesterol, Total  Date Value Ref Range Status  06/18/2023 167 100 - 199 mg/dL Final   LDL Cholesterol (Calc)  Date Value Ref Range Status  12/19/2020 166 (H) mg/dL (calc) Final    Comment:    Reference range: <100 . Desirable range <100 mg/dL for primary prevention;   <70 mg/dL for patients with CHD or diabetic patients  with > or = 2 CHD risk factors. SABRA LDL-C is now calculated using the Martin-Hopkins  calculation, which is a validated novel method providing  better accuracy than the Friedewald equation in the  estimation of LDL-C.  Gladis APPLETHWAITE et al. SANDREA. 7986;689(80): 2061-2068  (http://education.[facility].com/faq/FAQ164)    LDL Chol Calc (NIH)  Date Value Ref Range Status  06/18/2023 90 0 - 99 mg/dL Final   HDL  Date Value Ref Range Status  06/18/2023 48 >39 mg/dL Final   Triglycerides  Date Value Ref Range Status  06/18/2023 171 (H) 0 - 149 mg/dL Final         Passed - Patient is not pregnant      Passed - Valid encounter within last 12 months    Recent Outpatient Visits           4 months ago Pneumonia of right lower lobe due to infectious organism   [place] [facility] [place], Megan P, DO   4 months ago Primary hypertension   [facility] [place], [place], DO

## 2023-11-05 ENCOUNTER — Encounter: Payer: Self-pay | Admitting: Oncology

## 2023-11-15 NOTE — Progress Notes (Signed)
 Pharmacist Chemotherapy Monitoring - Initial Assessment    Anticipated start date: 11/29/23   The following has been reviewed per standard work regarding the patient's treatment regimen: The patient's diagnosis, treatment plan and drug doses, and organ/hematologic function Lab orders and baseline tests specific to treatment regimen  The treatment plan start date, drug sequencing, and pre-medications Prior authorization status  Patient's documented medication list, including drug-drug interaction screen and prescriptions for anti-emetics and supportive care specific to the treatment regimen The drug concentrations, fluid compatibility, administration routes, and timing of the medications to be used The patient's access for treatment and lifetime cumulative dose history, if applicable  The patient's medication allergies and previous infusion related reactions, if applicable    Repeat CT chest w contrast 4 weeks after final RT.  Plan maintenance Durvalumab Q2weeks for 1 year  Radiation complete   start maintenance durvalumab  Tanya Howell, RPH, 11/15/2023  1:03 PM

## 2023-11-22 ENCOUNTER — Ambulatory Visit
Admission: RE | Admit: 2023-11-22 | Discharge: 2023-11-22 | Disposition: A | Discharge status: Home / Self Care | Source: Ambulatory Visit | Attending: Oncology | Admitting: Oncology

## 2023-11-22 DIAGNOSIS — C3491 Malignant neoplasm of unspecified part of right bronchus or lung: Secondary | ICD-10-CM | POA: Insufficient documentation

## 2023-11-22 MED ORDER — IOHEXOL 300 MG/ML  SOLN
75.0000 mL | Freq: Once | INTRAMUSCULAR | Status: AC | PRN
  Administered 2023-11-22: 75 mL via INTRAVENOUS

## 2023-11-25 ENCOUNTER — Encounter: Payer: Self-pay | Admitting: Oncology

## 2023-11-25 ENCOUNTER — Ambulatory Visit: Admitting: Radiation Oncology

## 2023-11-28 ENCOUNTER — Telehealth: Payer: Self-pay | Admitting: Radiation Oncology

## 2023-11-28 ENCOUNTER — Ambulatory Visit
Admission: RE | Admit: 2023-11-28 | Discharge: 2023-11-28 | Disposition: A | Discharge status: Home / Self Care | Source: Ambulatory Visit | Attending: Radiation Oncology | Admitting: Radiation Oncology

## 2023-11-28 ENCOUNTER — Encounter: Payer: Self-pay | Admitting: Radiation Oncology

## 2023-11-28 VITALS — BP 127/83 | HR 108 | Temp 98.0°F | Ht 64.0 in | Wt 169.0 lb

## 2023-11-28 DIAGNOSIS — R1310 Dysphagia, unspecified: Secondary | ICD-10-CM | POA: Insufficient documentation

## 2023-11-28 DIAGNOSIS — C771 Secondary and unspecified malignant neoplasm of intrathoracic lymph nodes: Secondary | ICD-10-CM | POA: Insufficient documentation

## 2023-11-28 DIAGNOSIS — C3401 Malignant neoplasm of right main bronchus: Secondary | ICD-10-CM | POA: Diagnosis present

## 2023-11-28 DIAGNOSIS — Z923 Personal history of irradiation: Secondary | ICD-10-CM | POA: Insufficient documentation

## 2023-11-28 DIAGNOSIS — C3491 Malignant neoplasm of unspecified part of right bronchus or lung: Secondary | ICD-10-CM

## 2023-11-28 NOTE — Telephone Encounter (Signed)
 Called pt and scheduled CT - LH

## 2023-11-28 NOTE — Progress Notes (Signed)
 Radiation Oncology Follow up Note  Name: Tanya Howell   Date:   11/28/2023 MRN:  [id number] DOB: 07/22/1960    This 63 y.o. 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  REFERRING PROVIDER: Vicci Duwaine SQUIBB, DO  HPI: Patient is a 63 year old female now out 1 month after completed concurrent chemoradiation for stage IIIa adenocarcinoma the right lung.  Seen today in routine follow-up she is doing well.  She had problems with dysphagia throughout treatment that is completely cleared.  She is putting on weight.  She specifically [place] cough hemoptysis chest tightness or any change in her pulmonary status..  She did have a recent CT scan ordered by medical oncology showing diminished size of subcarinal and right hilar nodes.  Findings are consistent with treatment response.  No evidence of progressive or new disease in her chest.  COMPLICATIONS OF TREATMENT: none  FOLLOW UP COMPLIANCE: keeps appointments   PHYSICAL EXAM:  BP 127/83   Pulse (!) 108   Temp 98 F (36.7 C) (Tympanic)   Ht 5' 4 (1.626 m)   Wt 169 lb (76.7 kg)   BMI 29.01 kg/m  Well-developed well-nourished patient in NAD. HEENT reveals PERLA, EOMI, discs not visualized.  Oral cavity is clear. No oral mucosal lesions are identified. Neck is clear without evidence of cervical or supraclavicular adenopathy. Lungs are clear to A&P. Cardiac examination is essentially unremarkable with regular rate and rhythm without murmur rub or thrill. Abdomen is benign with no organomegaly or masses noted. Motor sensory and DTR levels are equal and symmetric in the upper and lower extremities. Cranial nerves II through XII are grossly intact. Proprioception is intact. No peripheral adenopathy or edema is identified. No motor or sensory levels are noted. Crude visual fields are within normal range.  RADIOLOGY RESULTS: CT scan  reviewed compatible with above-stated findings  PLAN: Present time patient is doing well with low side effect profile 1 month out from concurrent chemoradiation.  She is scheduled to start maintenance Durvalumab  under the direction of medical oncology.  I have asked to see her back in 3 to 4 months for follow-up with repeat CT scan at that time.  Patient comprehends my recommendations well.  She knows to call with any concerns.  I would like to take this opportunity to thank you for allowing me to participate in the care of your patient.Tanya [name], MD

## 2023-11-29 ENCOUNTER — Encounter: Payer: Self-pay | Admitting: Oncology

## 2023-11-29 ENCOUNTER — Encounter: Payer: Self-pay | Admitting: *Deleted

## 2023-11-29 ENCOUNTER — Inpatient Hospital Stay (HOSPITAL_BASED_OUTPATIENT_CLINIC_OR_DEPARTMENT_OTHER): Admitting: Oncology

## 2023-11-29 ENCOUNTER — Inpatient Hospital Stay: Attending: Oncology

## 2023-11-29 ENCOUNTER — Inpatient Hospital Stay

## 2023-11-29 ENCOUNTER — Other Ambulatory Visit: Payer: Self-pay

## 2023-11-29 VITALS — BP 120/80 | HR 98

## 2023-11-29 VITALS — BP 137/79 | HR 125 | Temp 97.7°F | Resp 18 | Wt 170.2 lb

## 2023-11-29 DIAGNOSIS — Z803 Family history of malignant neoplasm of breast: Secondary | ICD-10-CM | POA: Insufficient documentation

## 2023-11-29 DIAGNOSIS — Z87891 Personal history of nicotine dependence: Secondary | ICD-10-CM | POA: Diagnosis not present

## 2023-11-29 DIAGNOSIS — M8580 Other specified disorders of bone density and structure, unspecified site: Secondary | ICD-10-CM | POA: Diagnosis not present

## 2023-11-29 DIAGNOSIS — C3491 Malignant neoplasm of unspecified part of right bronchus or lung: Secondary | ICD-10-CM | POA: Diagnosis present

## 2023-11-29 DIAGNOSIS — Z5112 Encounter for antineoplastic immunotherapy: Secondary | ICD-10-CM | POA: Insufficient documentation

## 2023-11-29 DIAGNOSIS — Z7901 Long term (current) use of anticoagulants: Secondary | ICD-10-CM | POA: Diagnosis not present

## 2023-11-29 DIAGNOSIS — I2693 Single subsegmental pulmonary embolism without acute cor pulmonale: Secondary | ICD-10-CM

## 2023-11-29 DIAGNOSIS — Z800 Family history of malignant neoplasm of digestive organs: Secondary | ICD-10-CM | POA: Insufficient documentation

## 2023-11-29 DIAGNOSIS — I2699 Other pulmonary embolism without acute cor pulmonale: Secondary | ICD-10-CM | POA: Diagnosis not present

## 2023-11-29 LAB — CMP (CANCER CENTER ONLY)
ALT: 37 U/L (ref 0–44)
AST: 24 U/L (ref 15–41)
Albumin: 3.8 g/dL (ref 3.5–5.0)
Alkaline Phosphatase: 65 U/L (ref 38–126)
Anion gap: 8 (ref 5–15)
BUN: 14 mg/dL (ref 8–23)
CO2: 23 mmol/L (ref 22–32)
Calcium: 9.3 mg/dL (ref 8.9–10.3)
Chloride: 105 mmol/L (ref 98–111)
Creatinine: 0.86 mg/dL (ref 0.44–1.00)
GFR, Estimated: >60 mL/min (ref >60)
Glucose, Bld: 133 mg/dL — ABNORMAL HIGH (ref 70–99)
Potassium: 3.8 mmol/L (ref 3.5–5.1)
Sodium: 136 mmol/L (ref 135–145)
Total Bilirubin: 0.8 mg/dL (ref 0.0–1.2)
Total Protein: 7 g/dL (ref 6.5–8.1)

## 2023-11-29 LAB — CBC WITH DIFFERENTIAL (CANCER CENTER ONLY)
Abs Immature Granulocytes: 0.03 K/uL (ref 0.00–0.07)
Basophils Absolute: 0 K/uL (ref 0.0–0.1)
Basophils Relative: 1 %
Eosinophils Absolute: 0.1 K/uL (ref 0.0–0.5)
Eosinophils Relative: 4 %
HCT: 33.7 % — ABNORMAL LOW (ref 36.0–46.0)
Hemoglobin: 11.3 g/dL — ABNORMAL LOW (ref 12.0–15.0)
Immature Granulocytes: 1 %
Lymphocytes Relative: 17 %
Lymphs Abs: 0.6 K/uL — ABNORMAL LOW (ref 0.7–4.0)
MCH: 30.1 pg (ref 26.0–34.0)
MCHC: 33.5 g/dL (ref 30.0–36.0)
MCV: 89.9 fL (ref 80.0–100.0)
Monocytes Absolute: 0.4 K/uL (ref 0.1–1.0)
Monocytes Relative: 11 %
Neutro Abs: 2.4 K/uL (ref 1.7–7.7)
Neutrophils Relative %: 66 %
Platelet Count: 174 K/uL (ref 150–400)
RBC: 3.75 MIL/uL — ABNORMAL LOW (ref 3.87–5.11)
RDW: 17.5 % — ABNORMAL HIGH (ref 11.5–15.5)
WBC Count: 3.6 K/uL — ABNORMAL LOW (ref 4.0–10.5)
nRBC: 0 % (ref 0.0–0.2)

## 2023-11-29 LAB — TSH: TSH: 2.122 u[IU]/mL (ref 0.350–4.500)

## 2023-11-29 MED ORDER — ALTEPLASE 2 MG IJ SOLR
2.0000 mg | Freq: Once | INTRAMUSCULAR | Status: AC | PRN
  Administered 2023-11-29: 2 mg
  Filled 2023-11-29: qty 2

## 2023-11-29 MED ORDER — SODIUM CHLORIDE 0.9 % IV SOLN
INTRAVENOUS | Status: DC
  Filled 2023-11-29: qty 250

## 2023-11-29 MED ORDER — SODIUM CHLORIDE 0.9 % IV SOLN
10.0000 mg/kg | Freq: Once | INTRAVENOUS | Status: AC
  Administered 2023-11-29: 740 mg via INTRAVENOUS
  Filled 2023-11-29: qty 4.8

## 2023-11-29 NOTE — Assessment & Plan Note (Signed)
 Immunotherapy plan as list above

## 2023-11-29 NOTE — Assessment & Plan Note (Signed)
 Images and pathology results were reviewed with the patient. cT2 N2 M0, Stage III right lung adenocarcinoma.  NGS showed TPS 5% CPS 10  KRAS G12C, TMB 12.6  Labs are reviewed and discussed with patient. Proceed with carboplatin  AUC 2, Taxol  45mg /m2  Repeat CT chest w contrast 4 weeks after final RT.  Plan maintenance Durvalumab Q2weeks for 1 year

## 2023-11-29 NOTE — Assessment & Plan Note (Signed)
 conitnue calcium  supplementation 600mg  daily

## 2023-11-29 NOTE — Progress Notes (Signed)
 Nutrition Follow-up:  Patient with lung cancer.  Received last chemo on 7/14 and last radiation on 7/18.  Patient starting immunotherapy today.  Met with patient and husband.  Reports that her appetite is better.  Had a hard time swallowing for about 2 weeks after radiation (liquid pain medication helped).  Last night about to eat sloppy joe for supper and everything thin bagel with cream cheese and 2 slices of bacon for breakfast.     Medications: reviewed  Labs: reviewed  Anthropometrics:   Weight 170 lb today  163 lb on 7/14 163 lb 12.8 oz on 7/7 170 lb on 5/30 178 lb on 3/25   NUTRITION DIAGNOSIS: Inadequate oral intake improved    INTERVENTION:  Discussed survivorship nutrition with patient. Handout provided Patient has RD contact and will reach out if has further questions/needs     NEXT VISIT: no follow-up RD available as needed  Danija Gosa B. Dasie SOLON, CSO, LDN Registered Dietitian [phone]

## 2023-11-29 NOTE — Progress Notes (Signed)
 Hematology/Oncology Progress note [phone] [phone]       CHIEF COMPLAINTS/PURPOSE OF CONSULTATION:  Stage III lung adenocarcinoam  ASSESSMENT & PLAN:   Cancer Staging  Primary lung adenocarcinoma, right [facility]) Staging form: Lung, AJCC V9 - Clinical stage from 08/15/2023: Tanya Howell, cM0 - Signed by Babara Call, MD on 08/15/2023   Primary lung adenocarcinoma, right [facility]) Images and pathology results were reviewed with the patient. cT2 N2 M0, Stage III right lung adenocarcinoma.  NGS showed TPS 5% CPS 10  KRAS G12C, TMB 12.6  Labs are reviewed and discussed with patient. Proceed with carboplatin  AUC 2, Taxol  45mg /m2  Repeat CT chest w contrast 4 weeks after final RT.  Plan maintenance Durvalumab  Q2weeks for 1 year  Encounter for antineoplastic immunotherapy Immunotherapy plan as list above  Pulmonary embolism (HCC) Anticoagulation with Eliquis  5mg  BID.    Hypocalcemia conitnue calcium  supplementation 600mg  daily    Orders Placed This Encounter  Procedures   CBC with Differential (Cancer Center Only)    Standing Status:   Future    Expected Date:   12/13/2023    Expiration Date:   12/12/2024   CMP (Cancer Center only)    Standing Status:   Future    Expected Date:   12/13/2023    Expiration Date:   12/12/2024   CBC with Differential (Cancer Center Only)    Standing Status:   Future    Expected Date:   12/27/2023    Expiration Date:   12/26/2024   CMP (Cancer Center only)    Standing Status:   Future    Expected Date:   12/27/2023    Expiration Date:   12/26/2024   T4    Standing Status:   Future    Expected Date:   12/27/2023    Expiration Date:   12/26/2024   TSH    Standing Status:   Future    Expected Date:   12/27/2023    Expiration Date:   12/26/2024   CBC with Differential (Cancer Center Only)    Standing Status:   Future    Expected Date:   01/10/2024    Expiration Date:   01/09/2025   CMP (Cancer Center only)    Standing Status:   Future     Expected Date:   01/10/2024    Expiration Date:   01/09/2025   CBC with Differential (Cancer Center Only)    Standing Status:   Future    Expected Date:   01/24/2024    Expiration Date:   01/23/2025   CMP (Cancer Center only)    Standing Status:   Future    Expected Date:   01/24/2024    Expiration Date:   01/23/2025   Follow-up 4-5 week All questions were answered. The patient knows to call the clinic with any problems, questions or concerns.  Call Babara, MD, PhD [facility] Hematology Oncology 11/29/2023    HISTORY OF PRESENTING ILLNESS:  Tanya Howell 63 y.o. female presents to establish care for lung cancer  Oncology History  Primary lung adenocarcinoma, right (HCC)  08/03/2023 Imaging   CT angiogram chest PE protocol showed  1. 3.5 x 2.6 cm right hilar mass, with obstruction of the bronchus intermedius, right middle lobe bronchus, and portions of the right lower lobe bronchus as above, highly concerning for neoplasm. Bronchoscopy is recommended for further evaluation. 2. Subcarinal adenopathy concerning for metastatic disease. 3. Dense medial segment consolidation within the right middle lobe with associated volume loss,  consistent with atelectasis. 4. Prominent right lower lobe bronchiectasis, with fluid-filled bronchi and patchy right basilar consolidation consistent with combination of airspace disease and atelectasis. 5. No evidence of pulmonary embolus. 6. Aortic Atherosclerosis (ICD10-I70.0). Coronary artery atherosclerosis.   08/06/2023 Initial Diagnosis   Primary lung adenocarcinoma, right (HCC)  08/03/2023, patient presented to emergency room for evaluation of hemoptysis. Patient had pneumonia in March 2025, she continues to have a lingering cough for the past months despite being treated with doxycycline  and prednisone . She coughed up small amount of bright red blood on 08/03/2023.  Denies any shortness of breath, chest pain unintentional weight loss headache.   She felt feverish with chills 2 nights ago. Patient is a former smoker, quit smoking in 2011. Patient denies being on any antiplatelet or anticoagulation agents.  08/08/2023 She underwent biopsy via bronchoscopy.  FINAL MICROSCOPIC DIAGNOSIS:  A. LUNG, RLL, BRUSHING:  - Adenocarcinoma   B. LUNG, RLL, FINE NEEDLE ASPIRATION:  - Adenocarcinoma   Immunohistochemical stains were performed to characterize the tumor cells. The cells are negative for TTF-1, Napsin A, p40, synaptophysin, Chromogranin A, and CD56.  Mucicarmine stain highlights intracytoplasmic mucin. Ki67 proliferation index is approximately 30%. The overall findings are supportive of the diagnosis of adenocarcinoma.   C. LYMPH NODE, 7, FINE NEEDLE ASPIRATION:  - Adenocarcinoma  - Lymphoid tissue present   D. LYMPH NODE, 4R, FINE NEEDLE ASPIRATION:  - No malignant cells identified  - Lymphoid tissue present   E. LYMPH NODE, 11R, FINE NEEDLE ASPIRATION:  - No malignant cells identified  - Lymphoid tissue present   F. LUNG, RLL, LAVAGE:  FINAL MICROSCOPIC DIAGNOSIS:  - Atypical cells present    08/15/2023 Cancer Staging   Staging form: Lung, AJCC V9 - Clinical stage from 08/15/2023: Tanya Howell, cM0 - Signed by Babara Call, MD on 08/15/2023 Stage prefix: Initial diagnosis Method of lymph node assessment: Clinical   09/09/2023 - 10/21/2023 Chemotherapy   Patient is on Treatment Plan : LUNG Carboplatin  + Paclitaxel  + XRT q7d     11/22/2023 Imaging   CT chest with contrast   1. Diminished size subcarinal and right hilar node or mass, measuring 1.5 x 0.9 cm, previously 3.4 x 3.2 cm. Findings are consistent with treatment response. 2. No evidence of new or progressive disease in the chest. 3. Coronary artery disease   11/29/2023 -  Chemotherapy   Patient is on Treatment Plan : LUNG Durvalumab  (10) q14d      09/23/2023 diagnosis of Segmental pulmonary emboli in the lingula. She takes Eliquis  5mg  BID  She has no new concerns.   No additional episodes of hemoptysis. No additional episodes of hemoptysis.    MEDICAL HISTORY:  Past Medical History:  Diagnosis Date   COVID-19 07/2018   Hyperlipidemia    Hypertension    Lung cancer (HCC) 07/2023   Motion sickness    amuesment park rides   Osteopenia 11/01/2015   Pneumonia    april 2025   Psoriasis    Vertigo    none for over 5 yrs   Wears dentures    full upper and lower    SURGICAL HISTORY: Past Surgical History:  Procedure Laterality Date   BRONCHIAL BIOPSY  08/08/2023   Procedure: BRONCHOSCOPY, WITH BIOPSY;  Surgeon: Malka Domino, MD;  Location: [facility] END[facility]COPY;  Service: Pulmonary;;   BRONCHIAL BRUSHINGS  08/08/2023   Procedure: BRONCHOSCOPY, WITH BRUSH BIOPSY;  Surgeon: Malka Domino, MD;  Location: [facility] ENDOSCOPY;  Service: Pulmonary;;   BRONCHIAL NEEDLE ASPIRATION BIOPSY  08/08/2023   Procedure: BRONCHOSCOPY, WITH NEEDLE ASPIRATION BIOPSY;  Surgeon: Malka Domino, MD;  Location: [facility] ENDOSCOPY;  Service: Pulmonary;;   CHOLECYSTECTOMY  1990   COLONOSCOPY WITH PROPOFOL  N/A 07/15/2020   Procedure: COLONOSCOPY WITH PROPOFOL ;  Surgeon: Jinny Carmine, MD;  Location: [facility];  Service: Endoscopy;  Laterality: N/A;  priority 4   ENDOBRONCHIAL ULTRASOUND Bilateral 08/08/2023   Procedure: ENDOBRONCHIAL ULTRASOUND (EBUS);  Surgeon: Malka Domino, MD;  Location: [facility] ENDOSCOPY;  Service: Pulmonary;  Laterality: Bilateral;   IR IMAGING GUIDED PORT INSERTION  08/29/2023   TUBAL LIGATION      SOCIAL HISTORY: Social History   Socioeconomic History   Marital status: Married    Spouse name: Oneil   Number of children: 1   Years of education: Not on file   Highest education level: High school graduate  Occupational History   Not on file  Tobacco Use   Smoking status: Former    Current packs/day: 0.00    Average packs/day: 1 pack/day for 28.0 years (28.0 ttl pk-yrs)    Types: Cigarettes    Start date: 32    Quit date: 2011     Years since quitting: 14.6   Smokeless tobacco: Never   Tobacco comments:    Started smoking around 63 yrs old.    Smoked 1PPD at her heaviest.    Quit smoking in 2011- leda 08/07/2023  Vaping Use   Vaping status: Former  Substance and Sexual Activity   Alcohol use: No    Alcohol/week: 0.0 standard drinks of alcohol    Comment: rare   Drug use: No   Sexual activity: Yes    Birth control/protection: Surgical  Other Topics Concern   Not on file  Social History Narrative   Not on file   Social Drivers of Health   Financial Resource Strain: Low Risk  (05/06/2018)   Overall Financial Resource Strain (CARDIA)    Difficulty of Paying Living Expenses: Not hard at all  Food Insecurity: No Food Insecurity (09/24/2023)   Hunger Vital Sign    Worried About Running Out of Food in the Last Year: Never true    Ran Out of Food in the Last Year: Never true  Transportation Needs: No Transportation Needs (09/24/2023)   PRAPARE - Administrator, Civil Service (Medical): No    Lack of Transportation (Non-Medical): No  Physical Activity: Sufficiently Active (05/06/2018)   Exercise Vital Sign    Days of Exercise per Week: 7 days    Minutes of Exercise per Session: 30 min  Stress: No Stress Concern Present (05/06/2018)   [employer] of Occupational Health - Occupational Stress Questionnaire    Feeling of Stress : Not at all  Social Connections: Moderately Isolated (09/24/2023)   Social Connection and Isolation Panel    Frequency of Communication with Friends and Family: More than three times a week    Frequency of Social Gatherings with Friends and Family: Twice a week    Attends Religious Services: Never    Database administrator or Organizations: No    Attends Banker Meetings: Never    Marital Status: Married  Catering manager Violence: Not At Risk (09/24/2023)   Humiliation, Afraid, Rape, and Kick questionnaire    Fear of Current or Ex-Partner: No    Emotionally  Abused: No    Physically Abused: No    Sexually Abused: No    FAMILY HISTORY: Family History  Problem Relation Age of Onset  Colon cancer Brother        41    Breast cancer Mother    Breast cancer Maternal Aunt    Pneumonia Father    Diabetes Maternal Grandmother     ALLERGIES:  is allergic to lisinopril .  MEDICATIONS:  Current Outpatient Medications  Medication Sig Dispense Refill   apixaban  (ELIQUIS ) 5 MG TABS tablet Take 1 tablet (5 mg total) by mouth 2 (two) times daily. 60 tablet 4   atorvastatin  (LIPITOR) 10 MG tablet TAKE 1 TABLET BY MOUTH AT BEDTIME 90 tablet 0   benzonatate  (TESSALON  PERLES) 100 MG capsule Take 1 capsule (100 mg total) by mouth 2 (two) times daily as needed for cough. 30 capsule 1   guaifenesin  (ROBITUSSIN) 100 MG/5ML syrup Take 200 mg by mouth 3 (three) times daily as needed for cough.     guaiFENesin -codeine  100-10 MG/5ML syrup Take 5 mLs by mouth every 6 (six) hours as needed for cough. 118 mL 0   lansoprazole  (PREVACID ) 30 MG capsule Take 1 capsule (30 mg total) by mouth daily at 12 noon. 30 capsule 0   levalbuterol  (XOPENEX  HFA) 45 MCG/ACT inhaler Inhale 2 puffs into the lungs every 6 (six) hours as needed for wheezing. 15 g 3   lidocaine -prilocaine  (EMLA ) cream Apply to affected area once 30 g 3   losartan  (COZAAR ) 25 MG tablet Take 1/2 (one-half) tablet by mouth once daily 45 tablet 0   meclizine  (ANTIVERT ) 25 MG tablet Take 1 tablet (25 mg total) by mouth 3 (three) times daily as needed for dizziness. 30 tablet 3   ondansetron  (ZOFRAN ) 8 MG tablet Take 1 tablet (8 mg total) by mouth every 8 (eight) hours as needed for nausea or vomiting. Start on the third day after chemotherapy. 30 tablet 1   prochlorperazine  (COMPAZINE ) 10 MG tablet Take 1 tablet (10 mg total) by mouth every 6 (six) hours as needed for nausea or vomiting. 30 tablet 1   sucralfate  (CARAFATE ) 1 g tablet Take 1 tablet (1 g total) by mouth 3 (three) times daily before meals. 90 tablet  1   triamcinolone  ointment (KENALOG ) 0.5 % Apply 1 Application topically 2 (two) times daily. 30 g 6   No current facility-administered medications for this visit.   Facility-Administered Medications Ordered in Other Visits  Medication Dose Route Frequency Provider Last Rate Last Admin   0.9 %  sodium chloride  infusion   Intravenous Continuous Babara Call, MD   Stopped at 11/29/23 1145    Review of Systems  Constitutional:  Negative for appetite change, chills, fatigue and fever.  HENT:   Negative for hearing loss and voice change.   Eyes:  Negative for eye problems.  Respiratory:  Negative for chest tightness, cough and hemoptysis.   Cardiovascular:  Negative for chest pain.  Gastrointestinal:  Negative for abdominal distention, abdominal pain and blood in stool.  Endocrine: Negative for hot flashes.  Genitourinary:  Negative for difficulty urinating and frequency.   Musculoskeletal:  Negative for arthralgias.  Skin:  Negative for itching and rash.  Neurological:  Negative for extremity weakness.  Hematological:  Negative for adenopathy.  Psychiatric/Behavioral:  Negative for confusion.      PHYSICAL EXAMINATION: ECOG PERFORMANCE STATUS: 0 - Asymptomatic  Vitals:   11/29/23 0849  BP: 137/79  Pulse: (!) 125  Resp: 18  Temp: 97.7 F (36.5 C)  SpO2: 100%   Filed Weights   11/29/23 0849  Weight: 170 lb 3.2 oz (77.2 kg)    Physical Exam  Constitutional:      General: She is not in acute distress.    Appearance: She is not diaphoretic.  HENT:     Head: Normocephalic and atraumatic.  Eyes:     General: No scleral icterus. Cardiovascular:     Rate and Rhythm: Normal rate and regular rhythm.     Heart sounds: No murmur heard. Pulmonary:     Effort: Pulmonary effort is normal. No respiratory distress.     Breath sounds: No wheezing.  Abdominal:     General: There is no distension.     Palpations: Abdomen is soft.     Tenderness: There is no abdominal tenderness.   Musculoskeletal:        General: Normal range of motion.     Cervical back: Normal range of motion and neck supple.  Skin:    General: Skin is warm and dry.     Findings: No erythema.  Neurological:     Mental Status: She is alert and oriented to person, place, and time. Mental status is at baseline.     Motor: No abnormal muscle tone.  Psychiatric:        Mood and Affect: Affect normal.      LABORATORY DATA:  I have reviewed the data as listed    Latest Ref Rng & Units 11/29/2023    8:22 AM 10/21/2023    8:57 AM 10/14/2023    8:54 AM  CBC  WBC 4.0 - 10.5 K/uL 3.6  1.9  3.2   Hemoglobin 12.0 - 15.0 g/dL 88.6  89.4  89.3   Hematocrit 36.0 - 46.0 % 33.7  30.8  31.8   Platelets 150 - 400 K/uL 174  216  151       Latest Ref Rng & Units 11/29/2023    8:22 AM 10/21/2023    8:57 AM 10/14/2023    8:54 AM  CMP  Glucose 70 - 99 mg/dL 866  92  872   BUN 8 - 23 mg/dL 14  24  20    Creatinine 0.44 - 1.00 mg/dL 9.13  9.11  9.00   Sodium 135 - 145 mmol/L 136  134  136   Potassium 3.5 - 5.1 mmol/L 3.8  3.7  3.3   Chloride 98 - 111 mmol/L 105  108  105   CO2 22 - 32 mmol/L 23  22  23    Calcium  8.9 - 10.3 mg/dL 9.3  9.0  9.0   Total Protein 6.5 - 8.1 g/dL 7.0  6.4  6.1   Total Bilirubin 0.0 - 1.2 mg/dL 0.8  0.7  0.7   Alkaline Phos 38 - 126 U/L 65  53  52   AST 15 - 41 U/L 24  19  22    ALT 0 - 44 U/L 37  23  25      RADIOGRAPHIC STUDIES: I have personally reviewed the radiological images as listed and agreed with the findings in the report. CT Chest W Contrast Result Date: 11/25/2023 CLINICAL DATA:  Right lung cancer restaging * Tracking Code: BO * EXAM: CT CHEST WITH CONTRAST TECHNIQUE: Multidetector CT imaging of the chest was performed during intravenous contrast administration. RADIATION DOSE REDUCTION: This exam was performed according to the departmental dose-optimization program which includes automated exposure control, adjustment of the mA and/or kV according to patient size  and/or use of iterative reconstruction technique. CONTRAST:  75mL OMNIPAQUE  IOHEXOL  300 MG/ML  SOLN COMPARISON:  09/23/2023 FINDINGS: Cardiovascular: Right chest port catheter. Aortic atherosclerosis.  Left and right coronary artery calcifications. Normal heart size. No pericardial effusion. Mediastinum/Nodes: Diminished size of subcarinal lymph nodes as well as right hilar node or mass measuring 1.5 x 0.9 cm, previously 3.4 x 3.2 cm (series 2, image 70). No other mediastinal, hilar, or axillary lymph nodes. Thyroid gland, trachea, and esophagus demonstrate no significant findings. Lungs/Pleura: Mild, bandlike scarring of the right lower lobe and medial right middle lobe. No pleural effusion or pneumothorax. Upper Abdomen: No acute abnormality.  Cholecystectomy. Musculoskeletal: No chest wall abnormality. No acute osseous findings. IMPRESSION: 1. Diminished size subcarinal and right hilar node or mass, measuring 1.5 x 0.9 cm, previously 3.4 x 3.2 cm. Findings are consistent with treatment response. 2. No evidence of new or progressive disease in the chest. 3. Coronary artery disease. Aortic Atherosclerosis (ICD10-I70.0). Electronically Signed   By: Marolyn JONETTA Jaksch M.D.   On: 11/25/2023 06:29

## 2023-11-29 NOTE — Patient Instructions (Signed)
 [facility] - A DEPT OF [facility]  Discharge Instructions: Thank you for choosing [facility] to provide your oncology and hematology care.  If you have a lab appointment with the Cancer Center, please go directly to the Cancer Center and check in at the registration area.  Wear comfortable clothing and clothing appropriate for easy access to any Portacath or PICC line.   We strive to give you quality time with your provider. You may need to reschedule your appointment if you arrive late (15 or more minutes).  Arriving late affects you and other patients whose appointments are after yours.  Also, if you miss three or more appointments without notifying the office, you may be dismissed from the clinic at the provider's discretion.      For prescription refill requests, have your pharmacy contact our office and allow 72 hours for refills to be completed.    Today you received the following chemotherapy and/or immunotherapy agents IMFINZI      To help prevent nausea and vomiting after your treatment, we encourage you to take your nausea medication as directed.  BELOW ARE SYMPTOMS THAT SHOULD BE REPORTED IMMEDIATELY: *FEVER GREATER THAN 100.4 F (38 C) OR HIGHER *CHILLS OR SWEATING *NAUSEA AND VOMITING THAT IS NOT CONTROLLED WITH YOUR NAUSEA MEDICATION *UNUSUAL SHORTNESS OF BREATH *UNUSUAL BRUISING OR BLEEDING *URINARY PROBLEMS (pain or burning when urinating, or frequent urination) *BOWEL PROBLEMS (unusual diarrhea, constipation, pain near the anus) TENDERNESS IN MOUTH AND THROAT WITH OR WITHOUT PRESENCE OF ULCERS (sore throat, sores in mouth, or a toothache) UNUSUAL RASH, SWELLING OR PAIN  UNUSUAL VAGINAL DISCHARGE OR ITCHING   Items with * indicate a potential emergency and should be followed up as soon as possible or go to the Emergency Department if any problems should occur.  Please show the CHEMOTHERAPY ALERT CARD or IMMUNOTHERAPY  ALERT CARD at check-in to the Emergency Department and triage nurse.  Should you have questions after your visit or need to cancel or reschedule your appointment, please contact [facility] - A DEPT OF [facility]  [phone] and follow the prompts.  Office hours are 8:00 a.m. to 4:30 p.m. Monday - Friday. Please note that voicemails left after 4:00 p.m. may not be returned until the following business day.  We are closed weekends and major holidays. You have access to a nurse at all times for urgent questions. Please call the main number to the clinic [phone] and follow the prompts.  For any non-urgent questions, you may also contact your provider using MyChart. We now offer e-Visits for anyone 30 and older to request care online for non-urgent symptoms. For details visit [email or web address].   Also download the MyChart app! Go to the app store, search "MyChart", open the app, select [facility], and log in with your MyChart username and password.  Durvalumab Injection What is this medication? DURVALUMAB (dur VAL ue mab) treats some types of cancer. It works by helping your immune system slow or stop the spread of cancer cells. It is a monoclonal antibody. This medicine may be used for other purposes; ask your health care provider or pharmacist if you have questions. COMMON BRAND NAME(S): IMFINZI What should I tell my care team before I take this medication? They need to know if you have any of these conditions: Allogeneic stem cell transplant (uses someone else's stem cells) Autoimmune diseases, such as Crohn disease, ulcerative  colitis, lupus History of chest radiation Nervous system problems, such as Guillain-Barre syndrome, myasthenia gravis Organ transplant An unusual or allergic reaction to durvalumab, other medications, foods, dyes, or preservatives Pregnant or trying to get pregnant Breast-feeding How should I use this medication? This  medication is infused into a vein. It is given by your care team in a hospital or clinic setting. A special MedGuide will be given to you before each treatment. Be sure to read this information carefully each time. Talk to your care team about the use of this medication in children. Special care may be needed. Overdosage: If you think you have taken too much of this medicine contact a poison control center or emergency room at once. NOTE: This medicine is only for you. Do not share this medicine with others. What if I miss a dose? Keep appointments for follow-up doses. It is important not to miss your dose. Call your care team if you are unable to keep an appointment. What may interact with this medication? Interactions have not been studied. This list may not describe all possible interactions. Give your health care provider a list of all the medicines, herbs, non-prescription drugs, or dietary supplements you use. Also tell them if you smoke, drink alcohol, or use illegal drugs. Some items may interact with your medicine. What should I watch for while using this medication? Your condition will be monitored carefully while you are receiving this medication. You may need blood work while taking this medication. This medication may cause serious skin reactions. They can happen weeks to months after starting the medication. Contact your care team right away if you notice fevers or flu-like symptoms with a rash. The rash may be red or purple and then turn into blisters or peeling of the skin. You may also notice a red rash with swelling of the face, lips, or lymph nodes in your neck or under your arms. Tell your care team right away if you have any change in your eyesight. Talk to your care team if you may be pregnant. Serious birth defects can occur if you take this medication during pregnancy and for 3 months after the last dose. You will need a negative pregnancy test before starting this medication.  Contraception is recommended while taking this medication and for 3 months after the last dose. Your care team can help you find the option that works for you. Do not breastfeed while taking this medication and for 3 months after the last dose. What side effects may I notice from receiving this medication? Side effects that you should report to your care team as soon as possible: Allergic reactions--skin rash, itching, hives, swelling of the face, lips, tongue, or throat Dry cough, shortness of breath or trouble breathing Eye pain, redness, irritation, or discharge with blurry or decreased vision Heart muscle inflammation--unusual weakness or fatigue, shortness of breath, chest pain, fast or irregular heartbeat, dizziness, swelling of the ankles, feet, or hands Hormone gland problems--headache, sensitivity to light, unusual weakness or fatigue, dizziness, fast or irregular heartbeat, increased sensitivity to cold or heat, excessive sweating, constipation, hair loss, increased thirst or amount of urine, tremors or shaking, irritability Infusion reactions--chest pain, shortness of breath or trouble breathing, feeling faint or lightheaded Kidney injury (glomerulonephritis)--decrease in the amount of urine, red or dark brown urine, foamy or bubbly urine, swelling of the ankles, hands, or feet Liver injury--right upper belly pain, loss of appetite, nausea, light-colored stool, dark yellow or brown urine, yellowing skin or eyes,  unusual weakness or fatigue Pain, tingling, or numbness in the hands or feet, muscle weakness, change in vision, confusion or trouble speaking, loss of balance or coordination, trouble walking, seizures Rash, fever, and swollen lymph nodes Redness, blistering, peeling, or loosening of the skin, including inside the mouth Sudden or severe stomach pain, bloody diarrhea, fever, nausea, vomiting Side effects that usually do not require medical attention (report these to your care team  if they continue or are bothersome): Bone, joint, or muscle pain Diarrhea Fatigue Loss of appetite Nausea Skin rash This list may not describe all possible side effects. Call your doctor for medical advice about side effects. You may report side effects to FDA at 1-800-FDA-1088. Where should I keep my medication? This medication is given in a hospital or clinic. It will not be stored at home. NOTE: This sheet is a summary. It may not cover all possible information. If you have questions about this medicine, talk to your doctor, pharmacist, or health care provider.  2024 [employer]/Gold Standard (2021-08-08 00:00:00)

## 2023-11-29 NOTE — Assessment & Plan Note (Signed)
?   Anticoagulation with Eliquis 5 mg BID

## 2023-11-30 LAB — T4: T4, Total: 6.6 ug/dL (ref 4.5–12.0)

## 2023-12-13 ENCOUNTER — Inpatient Hospital Stay

## 2023-12-13 ENCOUNTER — Inpatient Hospital Stay: Attending: Oncology

## 2023-12-13 ENCOUNTER — Encounter: Payer: Self-pay | Admitting: Oncology

## 2023-12-13 ENCOUNTER — Inpatient Hospital Stay (HOSPITAL_BASED_OUTPATIENT_CLINIC_OR_DEPARTMENT_OTHER): Admitting: Oncology

## 2023-12-13 VITALS — BP 131/74 | HR 98 | Temp 98.0°F | Resp 18 | Wt 171.4 lb

## 2023-12-13 VITALS — BP 126/65 | HR 100 | Temp 95.0°F | Resp 19

## 2023-12-13 DIAGNOSIS — Z800 Family history of malignant neoplasm of digestive organs: Secondary | ICD-10-CM | POA: Diagnosis not present

## 2023-12-13 DIAGNOSIS — I2693 Single subsegmental pulmonary embolism without acute cor pulmonale: Secondary | ICD-10-CM | POA: Diagnosis not present

## 2023-12-13 DIAGNOSIS — C3491 Malignant neoplasm of unspecified part of right bronchus or lung: Secondary | ICD-10-CM

## 2023-12-13 DIAGNOSIS — M8580 Other specified disorders of bone density and structure, unspecified site: Secondary | ICD-10-CM | POA: Diagnosis not present

## 2023-12-13 DIAGNOSIS — Z5112 Encounter for antineoplastic immunotherapy: Secondary | ICD-10-CM | POA: Insufficient documentation

## 2023-12-13 DIAGNOSIS — Z7901 Long term (current) use of anticoagulants: Secondary | ICD-10-CM | POA: Diagnosis not present

## 2023-12-13 DIAGNOSIS — Z87891 Personal history of nicotine dependence: Secondary | ICD-10-CM | POA: Insufficient documentation

## 2023-12-13 DIAGNOSIS — I2699 Other pulmonary embolism without acute cor pulmonale: Secondary | ICD-10-CM | POA: Diagnosis not present

## 2023-12-13 DIAGNOSIS — Z803 Family history of malignant neoplasm of breast: Secondary | ICD-10-CM | POA: Diagnosis not present

## 2023-12-13 LAB — CBC WITH DIFFERENTIAL (CANCER CENTER ONLY)
Abs Immature Granulocytes: 0.03 K/uL (ref 0.00–0.07)
Basophils Absolute: 0.1 K/uL (ref 0.0–0.1)
Basophils Relative: 1 %
Eosinophils Absolute: 0.1 K/uL (ref 0.0–0.5)
Eosinophils Relative: 2 %
HCT: 32.7 % — ABNORMAL LOW (ref 36.0–46.0)
Hemoglobin: 10.8 g/dL — ABNORMAL LOW (ref 12.0–15.0)
Immature Granulocytes: 1 %
Lymphocytes Relative: 19 %
Lymphs Abs: 0.9 K/uL (ref 0.7–4.0)
MCH: 29.6 pg (ref 26.0–34.0)
MCHC: 33 g/dL (ref 30.0–36.0)
MCV: 89.6 fL (ref 80.0–100.0)
Monocytes Absolute: 0.6 K/uL (ref 0.1–1.0)
Monocytes Relative: 13 %
Neutro Abs: 2.9 K/uL (ref 1.7–7.7)
Neutrophils Relative %: 64 %
Platelet Count: 250 K/uL (ref 150–400)
RBC: 3.65 MIL/uL — ABNORMAL LOW (ref 3.87–5.11)
RDW: 15.7 % — ABNORMAL HIGH (ref 11.5–15.5)
WBC Count: 4.5 K/uL (ref 4.0–10.5)
nRBC: 0 % (ref 0.0–0.2)

## 2023-12-13 LAB — CMP (CANCER CENTER ONLY)
ALT: 22 U/L (ref 0–44)
AST: 19 U/L (ref 15–41)
Albumin: 4 g/dL (ref 3.5–5.0)
Alkaline Phosphatase: 67 U/L (ref 38–126)
Anion gap: 11 (ref 5–15)
BUN: 13 mg/dL (ref 8–23)
CO2: 21 mmol/L — ABNORMAL LOW (ref 22–32)
Calcium: 9.1 mg/dL (ref 8.9–10.3)
Chloride: 105 mmol/L (ref 98–111)
Creatinine: 0.74 mg/dL (ref 0.44–1.00)
GFR, Estimated: >60 mL/min (ref >60)
Glucose, Bld: 103 mg/dL — ABNORMAL HIGH (ref 70–99)
Potassium: 3.7 mmol/L (ref 3.5–5.1)
Sodium: 137 mmol/L (ref 135–145)
Total Bilirubin: 0.6 mg/dL (ref 0.0–1.2)
Total Protein: 6.9 g/dL (ref 6.5–8.1)

## 2023-12-13 MED ORDER — SODIUM CHLORIDE 0.9 % IV SOLN
INTRAVENOUS | Status: DC
Start: 2023-12-13 — End: 2023-12-13
  Filled 2023-12-13: qty 250

## 2023-12-13 MED ORDER — SODIUM CHLORIDE 0.9 % IV SOLN
10.0000 mg/kg | Freq: Once | INTRAVENOUS | Status: AC
  Administered 2023-12-13: 740 mg via INTRAVENOUS
  Filled 2023-12-13: qty 4.8

## 2023-12-13 NOTE — Assessment & Plan Note (Addendum)
 Anticoagulation with Eliquis  5mg  BID till Dec 2025, then decrease to 2.5mg  BID .

## 2023-12-13 NOTE — Assessment & Plan Note (Signed)
 Images and pathology results were reviewed with the patient. cT2 N2 M0, Stage III right lung adenocarcinoma.  NGS showed TPS 5% CPS 10  KRAS G12C, TMB 12.6  Labs are reviewed and discussed with patient. Proceed with carboplatin  AUC 2, Taxol  45mg /m2  Repeat CT chest w contrast 4 weeks after final RT.  Plan maintenance Durvalumab Q2weeks for 1 year

## 2023-12-13 NOTE — Assessment & Plan Note (Signed)
 Immunotherapy plan as list above

## 2023-12-13 NOTE — Progress Notes (Signed)
 Hematology/Oncology Progress note [phone] [phone]) [phone]       CHIEF COMPLAINTS/PURPOSE OF CONSULTATION:  Stage III lung adenocarcinoam  ASSESSMENT & PLAN:   Cancer Staging  Primary lung adenocarcinoma, right [facility]) Staging form: Lung, AJCC V9 - Clinical stage from 08/15/2023: [name], cM0 - Signed by Babara Call, MD on 08/15/2023   Primary lung adenocarcinoma, right [facility]) Images and pathology results were reviewed with the patient. cT2 N2 M0, Stage III right lung adenocarcinoma.  NGS showed TPS 5% CPS 10  KRAS G12C, TMB 12.6  Labs are reviewed and discussed with patient. Proceed with carboplatin  AUC 2, Taxol  45mg /m2  Repeat CT chest w contrast 4 weeks after final RT.  Plan maintenance Durvalumab  Q2weeks for 1 year  Encounter for antineoplastic immunotherapy Immunotherapy plan as list above  Pulmonary embolism (HCC) Anticoagulation with Eliquis  5mg  BID till Dec 2025, then decrease to 2.5mg  BID .      No orders of the defined types were placed in this encounter.  Follow-up 3 weeks  All questions were answered. The patient knows to call the clinic with any problems, questions or concerns.  Call Babara, MD, PhD [facility] Hematology Oncology 12/13/2023    HISTORY OF PRESENTING ILLNESS:  RASCHELLE WISENBAKER 63 y.o. female presents to establish care for lung cancer  Oncology History  Primary lung adenocarcinoma, right (HCC)  08/03/2023 Imaging   CT angiogram chest PE protocol showed  1. 3.5 x 2.6 cm right hilar mass, with obstruction of the bronchus intermedius, right middle lobe bronchus, and portions of the right lower lobe bronchus as above, highly concerning for neoplasm. Bronchoscopy is recommended for further evaluation. 2. Subcarinal adenopathy concerning for metastatic disease. 3. Dense medial segment consolidation within the right middle lobe with associated volume loss, consistent with atelectasis. 4. Prominent right lower lobe bronchiectasis,  with fluid-filled bronchi and patchy right basilar consolidation consistent with combination of airspace disease and atelectasis. 5. No evidence of pulmonary embolus. 6. Aortic Atherosclerosis (ICD10-I70.0). Coronary artery atherosclerosis.   08/06/2023 Initial Diagnosis   Primary lung adenocarcinoma, right (HCC)  08/03/2023, patient presented to emergency room for evaluation of hemoptysis. Patient had pneumonia in March 2025, she continues to have a lingering cough for the past months despite being treated with doxycycline  and prednisone . She coughed up small amount of bright red blood on 08/03/2023.  Denies any shortness of breath, chest pain unintentional weight loss headache.  She felt feverish with chills 2 nights ago. Patient is a former smoker, quit smoking in 2011. Patient denies being on any antiplatelet or anticoagulation agents.  08/08/2023 She underwent biopsy via bronchoscopy.  FINAL MICROSCOPIC DIAGNOSIS:  A. LUNG, RLL, BRUSHING:  - Adenocarcinoma   B. LUNG, RLL, FINE NEEDLE ASPIRATION:  - Adenocarcinoma   Immunohistochemical stains were performed to characterize the tumor cells. The cells are negative for TTF-1, Napsin A, p40, synaptophysin, Chromogranin A, and CD56.  Mucicarmine stain highlights intracytoplasmic mucin. Ki67 proliferation index is approximately 30%. The overall findings are supportive of the diagnosis of adenocarcinoma.   C. LYMPH NODE, 7, FINE NEEDLE ASPIRATION:  - Adenocarcinoma  - Lymphoid tissue present   D. LYMPH NODE, 4R, FINE NEEDLE ASPIRATION:  - No malignant cells identified  - Lymphoid tissue present   E. LYMPH NODE, 11R, FINE NEEDLE ASPIRATION:  - No malignant cells identified  - Lymphoid tissue present   F. LUNG, RLL, LAVAGE:  FINAL MICROSCOPIC DIAGNOSIS:  - Atypical cells present    08/15/2023 Cancer Staging   Staging form: Lung,  AJCC V9 - Clinical stage from 08/15/2023: [name], cM0 - Signed by Babara Call, MD on 08/15/2023 Stage  prefix: Initial diagnosis Method of lymph node assessment: Clinical   09/09/2023 - 10/21/2023 Chemotherapy   Patient is on Treatment Plan : LUNG Carboplatin  + Paclitaxel  + XRT q7d     11/22/2023 Imaging   CT chest with contrast   1. Diminished size subcarinal and right hilar node or mass, measuring 1.5 x 0.9 cm, previously 3.4 x 3.2 cm. Findings are consistent with treatment response. 2. No evidence of new or progressive disease in the chest. 3. Coronary artery disease   11/29/2023 -  Chemotherapy   Patient is on Treatment Plan : LUNG Durvalumab  (10) q14d      09/23/2023 diagnosis of Segmental pulmonary emboli in the lingula. She takes Eliquis  5mg  BID  She has no new concerns.  No additional episodes of hemoptysis. No additional episodes of hemoptysis.    MEDICAL HISTORY:  Past Medical History:  Diagnosis Date   COVID-19 07/2018   Hyperlipidemia    Hypertension    Lung cancer (HCC) 07/2023   Motion sickness    amuesment park rides   Osteopenia 11/01/2015   Pneumonia    april 2025   Psoriasis    Vertigo    none for over 5 yrs   Wears dentures    full upper and lower    SURGICAL HISTORY: Past Surgical History:  Procedure Laterality Date   BRONCHIAL BIOPSY  08/08/2023   Procedure: BRONCHOSCOPY, WITH BIOPSY;  Surgeon: Malka Domino, MD;  Location: [facility] END[facility]COPY;  Service: Pulmonary;;   BRONCHIAL BRUSHINGS  08/08/2023   Procedure: BRONCHOSCOPY, WITH BRUSH BIOPSY;  Surgeon: Malka Domino, MD;  Location: [facility] ENDOSCOPY;  Service: Pulmonary;;   BRONCHIAL NEEDLE ASPIRATION BIOPSY  08/08/2023   Procedure: BRONCHOSCOPY, WITH NEEDLE ASPIRATION BIOPSY;  Surgeon: Malka Domino, MD;  Location: [facility] ENDOSCOPY;  Service: Pulmonary;;   CHOLECYSTECTOMY  1990   COLONOSCOPY WITH PROPOFOL  N/A 07/15/2020   Procedure: COLONOSCOPY WITH PROPOFOL ;  Surgeon: Jinny Carmine, MD;  Location: [facility];  Service: Endoscopy;  Laterality: N/A;  priority 4   ENDOBRONCHIAL ULTRASOUND  Bilateral 08/08/2023   Procedure: ENDOBRONCHIAL ULTRASOUND (EBUS);  Surgeon: Malka Domino, MD;  Location[facility];  Service: Pulmonary;  Laterality: Bilateral;   IR IMAGING GUIDED PORT INSERTION  08/29/2023   TUBAL LIGATION      SOCIAL HISTORY: Social History   Socioeconomic History   Marital status: Married    Spouse name: Oneil   Number of children: 1   Years of education: Not on file   Highest education level: High school graduate  Occupational History   Not on file  Tobacco Use   Smoking status: Former    Current packs/day: 0.00    Average packs/day: 1 pack/day for 28.0 years (28.0 ttl pk-yrs)    Types: Cigarettes    Start date: 27    Quit date: 2011    Years since quitting: 14.6   Smokeless tobacco: Never   Tobacco comments:    Started smoking around 63 yrs old.    Smoked 1PPD at her heaviest.    Quit smoking in 2011- leda 08/07/2023  Vaping Use   Vaping status: Former  Substance and Sexual Activity   Alcohol use: No    Alcohol/week: 0.0 standard drinks of alcohol    Comment: rare   Drug use: No   Sexual activity: Yes    Birth control/protection: Surgical  Other Topics Concern   Not on  file  Social History Narrative   Not on file   Social Drivers of Health   Financial Resource Strain: Low Risk  (05/06/2018)   Overall Financial Resource Strain (CARDIA)    Difficulty of Paying Living Expenses: Not hard at all  Food Insecurity: No Food Insecurity (09/24/2023)   Hunger Vital Sign    Worried About Running Out of Food in the Last Year: Never true    Ran Out of Food in the Last Year: Never true  Transportation Needs: No Transportation Needs (09/24/2023)   PRAPARE - Administrator, Civil Service (Medical): No    Lack of Transportation (Non-Medical): No  Physical Activity: Sufficiently Active (05/06/2018)   Exercise Vital Sign    Days of Exercise per Week: 7 days    Minutes of Exercise per Session: 30 min  Stress: No Stress Concern Present  (05/06/2018)   [employer] of Occupational Health - Occupational Stress Questionnaire    Feeling of Stress : Not at all  Social Connections: Moderately Isolated (09/24/2023)   Social Connection and Isolation Panel    Frequency of Communication with Friends and Family: More than three times a week    Frequency of Social Gatherings with Friends and Family: Twice a week    Attends Religious Services: Never    Database administrator or Organizations: No    Attends Banker Meetings: Never    Marital Status: Married  Catering manager Violence: Not At Risk (09/24/2023)   Humiliation, Afraid, Rape, and Kick questionnaire    Fear of Current or Ex-Partner: No    Emotionally Abused: No    Physically Abused: No    Sexually Abused: No    FAMILY HISTORY: Family History  Problem Relation Age of Onset   Colon cancer Brother        86    Breast cancer Mother    Breast cancer Maternal Aunt    Pneumonia Father    Diabetes Maternal Grandmother     ALLERGIES:  is allergic to lisinopril .  MEDICATIONS:  Current Outpatient Medications  Medication Sig Dispense Refill   apixaban  (ELIQUIS ) 5 MG TABS tablet Take 1 tablet (5 mg total) by mouth 2 (two) times daily. 60 tablet 4   atorvastatin  (LIPITOR) 10 MG tablet TAKE 1 TABLET BY MOUTH AT BEDTIME 90 tablet 0   benzonatate  (TESSALON  PERLES) 100 MG capsule Take 1 capsule (100 mg total) by mouth 2 (two) times daily as needed for cough. 30 capsule 1   guaifenesin  (ROBITUSSIN) 100 MG/5ML syrup Take 200 mg by mouth 3 (three) times daily as needed for cough.     guaiFENesin -codeine  100-10 MG/5ML syrup Take 5 mLs by mouth every 6 (six) hours as needed for cough. 118 mL 0   lansoprazole  (PREVACID ) 30 MG capsule Take 1 capsule (30 mg total) by mouth daily at 12 noon. 30 capsule 0   levalbuterol  (XOPENEX  HFA) 45 MCG/ACT inhaler Inhale 2 puffs into the lungs every 6 (six) hours as needed for wheezing. 15 g 3   lidocaine -prilocaine  (EMLA ) cream Apply  to affected area once 30 g 3   losartan  (COZAAR ) 25 MG tablet Take 1/2 (one-half) tablet by mouth once daily 45 tablet 0   meclizine  (ANTIVERT ) 25 MG tablet Take 1 tablet (25 mg total) by mouth 3 (three) times daily as needed for dizziness. 30 tablet 3   ondansetron  (ZOFRAN ) 8 MG tablet Take 1 tablet (8 mg total) by mouth every 8 (eight) hours as needed for nausea  or vomiting. Start on the third day after chemotherapy. 30 tablet 1   prochlorperazine  (COMPAZINE ) 10 MG tablet Take 1 tablet (10 mg total) by mouth every 6 (six) hours as needed for nausea or vomiting. 30 tablet 1   sucralfate  (CARAFATE ) 1 g tablet Take 1 tablet (1 g total) by mouth 3 (three) times daily before meals. 90 tablet 1   triamcinolone  ointment (KENALOG ) 0.5 % Apply 1 Application topically 2 (two) times daily. 30 g 6   No current facility-administered medications for this visit.   Facility-Administered Medications Ordered in Other Visits  Medication Dose Route Frequency Provider Last Rate Last Admin   0.9 %  sodium chloride  infusion   Intravenous Continuous Babara Call, MD 10 mL/hr at 12/13/23 0942 New Bag at 12/13/23 0942    Review of Systems  Constitutional:  Negative for appetite change, chills, fatigue and fever.  HENT:   Negative for hearing loss and voice change.   Eyes:  Negative for eye problems.  Respiratory:  Negative for chest tightness, cough and hemoptysis.   Cardiovascular:  Negative for chest pain.  Gastrointestinal:  Negative for abdominal distention, abdominal pain and blood in stool.  Endocrine: Negative for hot flashes.  Genitourinary:  Negative for difficulty urinating and frequency.   Musculoskeletal:  Negative for arthralgias.  Skin:  Negative for itching and rash.  Neurological:  Negative for extremity weakness.  Hematological:  Negative for adenopathy.  Psychiatric/Behavioral:  Negative for confusion.      PHYSICAL EXAMINATION: ECOG PERFORMANCE STATUS: 0 - Asymptomatic  Vitals:   12/13/23  0859  BP: 131/74  Pulse: 98  Resp: 18  Temp: 98 F (36.7 C)  SpO2: 100%   Filed Weights   12/13/23 0859  Weight: 171 lb 6.4 oz (77.7 kg)    Physical Exam Constitutional:      General: She is not in acute distress.    Appearance: She is not diaphoretic.  HENT:     Head: Normocephalic and atraumatic.  Eyes:     General: No scleral icterus. Cardiovascular:     Rate and Rhythm: Normal rate and regular rhythm.     Heart sounds: No murmur heard. Pulmonary:     Effort: Pulmonary effort is normal. No respiratory distress.     Breath sounds: No wheezing.  Abdominal:     General: There is no distension.     Palpations: Abdomen is soft.     Tenderness: There is no abdominal tenderness.  Musculoskeletal:        General: Normal range of motion.     Cervical back: Normal range of motion and neck supple.  Skin:    General: Skin is warm and dry.     Findings: No erythema.  Neurological:     Mental Status: She is alert and oriented to person, place, and time. Mental status is at baseline.     Motor: No abnormal muscle tone.  Psychiatric:        Mood and Affect: Affect normal.      LABORATORY DATA:  I have reviewed the data as listed    Latest Ref Rng & Units 12/13/2023    8:48 AM 11/29/2023    8:22 AM 10/21/2023    8:57 AM  CBC  WBC 4.0 - 10.5 K/uL 4.5  3.6  1.9   Hemoglobin 12.0 - 15.0 g/dL 89.1  88.6  89.4   Hematocrit 36.0 - 46.0 % 32.7  33.7  30.8   Platelets 150 - 400 K/uL 250  174  216       Latest Ref Rng & Units 12/13/2023    8:48 AM 11/29/2023    8:22 AM 10/21/2023    8:57 AM  CMP  Glucose 70 - 99 mg/dL 896  866  92   BUN 8 - 23 mg/dL 13  14  24    Creatinine 0.44 - 1.00 mg/dL 9.25  9.13  9.11   Sodium 135 - 145 mmol/L 137  136  134   Potassium 3.5 - 5.1 mmol/L 3.7  3.8  3.7   Chloride 98 - 111 mmol/L 105  105  108   CO2 22 - 32 mmol/L 21  23  22    Calcium  8.9 - 10.3 mg/dL 9.1  9.3  9.0   Total Protein 6.5 - 8.1 g/dL 6.9  7.0  6.4   Total Bilirubin 0.0 - 1.2  mg/dL 0.6  0.8  0.7   Alkaline Phos 38 - 126 U/L 67  65  53   AST 15 - 41 U/L 19  24  19    ALT 0 - 44 U/L 22  37  23      RADIOGRAPHIC STUDIES: I have personally reviewed the radiological images as listed and agreed with the findings in the report. CT Chest W Contrast Result Date: 11/25/2023 CLINICAL DATA:  Right lung cancer restaging * Tracking Code: BO * EXAM: CT CHEST WITH CONTRAST TECHNIQUE: Multidetector CT imaging of the chest was performed during intravenous contrast administration. RADIATION DOSE REDUCTION: This exam was performed according to the departmental dose-optimization program which includes automated exposure control, adjustment of the mA and/or kV according to patient size and/or use of iterative reconstruction technique. CONTRAST:  75mL OMNIPAQUE  IOHEXOL  300 MG/ML  SOLN COMPARISON:  09/23/2023 FINDINGS: Cardiovascular: Right chest port catheter. Aortic atherosclerosis. Left and right coronary artery calcifications. Normal heart size. No pericardial effusion. Mediastinum/Nodes: Diminished size of subcarinal lymph nodes as well as right hilar node or mass measuring 1.5 x 0.9 cm, previously 3.4 x 3.2 cm (series 2, image 70). No other mediastinal, hilar, or axillary lymph nodes. Thyroid  gland, trachea, and esophagus demonstrate no significant findings. Lungs/Pleura: Mild, bandlike scarring of the right lower lobe and medial right middle lobe. No pleural effusion or pneumothorax. Upper Abdomen: No acute abnormality.  Cholecystectomy. Musculoskeletal: No chest wall abnormality. No acute osseous findings. IMPRESSION: 1. Diminished size subcarinal and right hilar node or mass, measuring 1.5 x 0.9 cm, previously 3.4 x 3.2 cm. Findings are consistent with treatment response. 2. No evidence of new or progressive disease in the chest. 3. Coronary artery disease. Aortic Atherosclerosis (ICD10-I70.0). Electronically Signed   By: Marolyn JONETTA Jaksch M.D.   On: 11/25/2023 06:29

## 2023-12-27 ENCOUNTER — Encounter: Payer: Self-pay | Admitting: Oncology

## 2023-12-27 ENCOUNTER — Inpatient Hospital Stay (HOSPITAL_BASED_OUTPATIENT_CLINIC_OR_DEPARTMENT_OTHER): Admitting: Oncology

## 2023-12-27 ENCOUNTER — Inpatient Hospital Stay

## 2023-12-27 VITALS — BP 129/80

## 2023-12-27 VITALS — BP 129/86 | HR 84 | Temp 97.0°F | Resp 18 | Wt 172.5 lb

## 2023-12-27 DIAGNOSIS — C3491 Malignant neoplasm of unspecified part of right bronchus or lung: Secondary | ICD-10-CM

## 2023-12-27 DIAGNOSIS — I2693 Single subsegmental pulmonary embolism without acute cor pulmonale: Secondary | ICD-10-CM

## 2023-12-27 DIAGNOSIS — Z5112 Encounter for antineoplastic immunotherapy: Secondary | ICD-10-CM

## 2023-12-27 DIAGNOSIS — R059 Cough, unspecified: Secondary | ICD-10-CM

## 2023-12-27 DIAGNOSIS — E876 Hypokalemia: Secondary | ICD-10-CM | POA: Diagnosis not present

## 2023-12-27 LAB — CBC WITH DIFFERENTIAL (CANCER CENTER ONLY)
Abs Immature Granulocytes: 0.02 K/uL (ref 0.00–0.07)
Basophils Absolute: 0.1 K/uL (ref 0.0–0.1)
Basophils Relative: 1 %
Eosinophils Absolute: 0.1 K/uL (ref 0.0–0.5)
Eosinophils Relative: 2 %
HCT: 32.9 % — ABNORMAL LOW (ref 36.0–46.0)
Hemoglobin: 11.2 g/dL — ABNORMAL LOW (ref 12.0–15.0)
Immature Granulocytes: 0 %
Lymphocytes Relative: 14 %
Lymphs Abs: 0.7 K/uL (ref 0.7–4.0)
MCH: 29.8 pg (ref 26.0–34.0)
MCHC: 34 g/dL (ref 30.0–36.0)
MCV: 87.5 fL (ref 80.0–100.0)
Monocytes Absolute: 0.6 K/uL (ref 0.1–1.0)
Monocytes Relative: 12 %
Neutro Abs: 3.6 K/uL (ref 1.7–7.7)
Neutrophils Relative %: 71 %
Platelet Count: 267 K/uL (ref 150–400)
RBC: 3.76 MIL/uL — ABNORMAL LOW (ref 3.87–5.11)
RDW: 14.2 % (ref 11.5–15.5)
WBC Count: 5.1 K/uL (ref 4.0–10.5)
nRBC: 0 % (ref 0.0–0.2)

## 2023-12-27 LAB — TSH: TSH: 1.862 u[IU]/mL (ref 0.350–4.500)

## 2023-12-27 LAB — CMP (CANCER CENTER ONLY)
ALT: 22 U/L (ref 0–44)
AST: 23 U/L (ref 15–41)
Albumin: 4 g/dL (ref 3.5–5.0)
Alkaline Phosphatase: 69 U/L (ref 38–126)
Anion gap: 9 (ref 5–15)
BUN: 11 mg/dL (ref 8–23)
CO2: 23 mmol/L (ref 22–32)
Calcium: 9.2 mg/dL (ref 8.9–10.3)
Chloride: 106 mmol/L (ref 98–111)
Creatinine: 0.76 mg/dL (ref 0.44–1.00)
GFR, Estimated: >60 mL/min (ref >60)
Glucose, Bld: 108 mg/dL — ABNORMAL HIGH (ref 70–99)
Potassium: 3.4 mmol/L — ABNORMAL LOW (ref 3.5–5.1)
Sodium: 138 mmol/L (ref 135–145)
Total Bilirubin: 0.6 mg/dL (ref 0.0–1.2)
Total Protein: 6.8 g/dL (ref 6.5–8.1)

## 2023-12-27 MED ORDER — SODIUM CHLORIDE 0.9 % IV SOLN
10.0000 mg/kg | Freq: Once | INTRAVENOUS | Status: AC
  Administered 2023-12-27: 740 mg via INTRAVENOUS
  Filled 2023-12-27: qty 10

## 2023-12-27 MED ORDER — SODIUM CHLORIDE 0.9 % IV SOLN
INTRAVENOUS | Status: DC
  Filled 2023-12-27: qty 250

## 2023-12-27 NOTE — Patient Instructions (Signed)
 [facility] - A DEPT OF [facility]  Discharge Instructions: Thank you for choosing [facility] to provide your oncology and hematology care.  If you have a lab appointment with the Cancer Center, please go directly to the Cancer Center and check in at the registration area.  Wear comfortable clothing and clothing appropriate for easy access to any Portacath or PICC line.   We strive to give you quality time with your provider. You may need to reschedule your appointment if you arrive late (15 or more minutes).  Arriving late affects you and other patients whose appointments are after yours.  Also, if you miss three or more appointments without notifying the office, you may be dismissed from the clinic at the provider's discretion.      For prescription refill requests, have your pharmacy contact our office and allow 72 hours for refills to be completed.    Today you received the following chemotherapy and/or immunotherapy agents IMFINZI      To help prevent nausea and vomiting after your treatment, we encourage you to take your nausea medication as directed.  BELOW ARE SYMPTOMS THAT SHOULD BE REPORTED IMMEDIATELY: *FEVER GREATER THAN 100.4 F (38 C) OR HIGHER *CHILLS OR SWEATING *NAUSEA AND VOMITING THAT IS NOT CONTROLLED WITH YOUR NAUSEA MEDICATION *UNUSUAL SHORTNESS OF BREATH *UNUSUAL BRUISING OR BLEEDING *URINARY PROBLEMS (pain or burning when urinating, or frequent urination) *BOWEL PROBLEMS (unusual diarrhea, constipation, pain near the anus) TENDERNESS IN MOUTH AND THROAT WITH OR WITHOUT PRESENCE OF ULCERS (sore throat, sores in mouth, or a toothache) UNUSUAL RASH, SWELLING OR PAIN  UNUSUAL VAGINAL DISCHARGE OR ITCHING   Items with * indicate a potential emergency and should be followed up as soon as possible or go to the Emergency Department if any problems should occur.  Please show the CHEMOTHERAPY ALERT CARD or IMMUNOTHERAPY  ALERT CARD at check-in to the Emergency Department and triage nurse.  Should you have questions after your visit or need to cancel or reschedule your appointment, please contact [facility] - A DEPT OF [facility]  [phone] and follow the prompts.  Office hours are 8:00 a.m. to 4:30 p.m. Monday - Friday. Please note that voicemails left after 4:00 p.m. may not be returned until the following business day.  We are closed weekends and major holidays. You have access to a nurse at all times for urgent questions. Please call the main number to the clinic [phone] and follow the prompts.  For any non-urgent questions, you may also contact your provider using MyChart. We now offer e-Visits for anyone 30 and older to request care online for non-urgent symptoms. For details visit [email or web address].   Also download the MyChart app! Go to the app store, search "MyChart", open the app, select [facility], and log in with your MyChart username and password.  Durvalumab Injection What is this medication? DURVALUMAB (dur VAL ue mab) treats some types of cancer. It works by helping your immune system slow or stop the spread of cancer cells. It is a monoclonal antibody. This medicine may be used for other purposes; ask your health care provider or pharmacist if you have questions. COMMON BRAND NAME(S): IMFINZI What should I tell my care team before I take this medication? They need to know if you have any of these conditions: Allogeneic stem cell transplant (uses someone else's stem cells) Autoimmune diseases, such as Crohn disease, ulcerative  colitis, lupus History of chest radiation Nervous system problems, such as Guillain-Barre syndrome, myasthenia gravis Organ transplant An unusual or allergic reaction to durvalumab, other medications, foods, dyes, or preservatives Pregnant or trying to get pregnant Breast-feeding How should I use this medication? This  medication is infused into a vein. It is given by your care team in a hospital or clinic setting. A special MedGuide will be given to you before each treatment. Be sure to read this information carefully each time. Talk to your care team about the use of this medication in children. Special care may be needed. Overdosage: If you think you have taken too much of this medicine contact a poison control center or emergency room at once. NOTE: This medicine is only for you. Do not share this medicine with others. What if I miss a dose? Keep appointments for follow-up doses. It is important not to miss your dose. Call your care team if you are unable to keep an appointment. What may interact with this medication? Interactions have not been studied. This list may not describe all possible interactions. Give your health care provider a list of all the medicines, herbs, non-prescription drugs, or dietary supplements you use. Also tell them if you smoke, drink alcohol, or use illegal drugs. Some items may interact with your medicine. What should I watch for while using this medication? Your condition will be monitored carefully while you are receiving this medication. You may need blood work while taking this medication. This medication may cause serious skin reactions. They can happen weeks to months after starting the medication. Contact your care team right away if you notice fevers or flu-like symptoms with a rash. The rash may be red or purple and then turn into blisters or peeling of the skin. You may also notice a red rash with swelling of the face, lips, or lymph nodes in your neck or under your arms. Tell your care team right away if you have any change in your eyesight. Talk to your care team if you may be pregnant. Serious birth defects can occur if you take this medication during pregnancy and for 3 months after the last dose. You will need a negative pregnancy test before starting this medication.  Contraception is recommended while taking this medication and for 3 months after the last dose. Your care team can help you find the option that works for you. Do not breastfeed while taking this medication and for 3 months after the last dose. What side effects may I notice from receiving this medication? Side effects that you should report to your care team as soon as possible: Allergic reactions--skin rash, itching, hives, swelling of the face, lips, tongue, or throat Dry cough, shortness of breath or trouble breathing Eye pain, redness, irritation, or discharge with blurry or decreased vision Heart muscle inflammation--unusual weakness or fatigue, shortness of breath, chest pain, fast or irregular heartbeat, dizziness, swelling of the ankles, feet, or hands Hormone gland problems--headache, sensitivity to light, unusual weakness or fatigue, dizziness, fast or irregular heartbeat, increased sensitivity to cold or heat, excessive sweating, constipation, hair loss, increased thirst or amount of urine, tremors or shaking, irritability Infusion reactions--chest pain, shortness of breath or trouble breathing, feeling faint or lightheaded Kidney injury (glomerulonephritis)--decrease in the amount of urine, red or dark brown urine, foamy or bubbly urine, swelling of the ankles, hands, or feet Liver injury--right upper belly pain, loss of appetite, nausea, light-colored stool, dark yellow or brown urine, yellowing skin or eyes,  unusual weakness or fatigue Pain, tingling, or numbness in the hands or feet, muscle weakness, change in vision, confusion or trouble speaking, loss of balance or coordination, trouble walking, seizures Rash, fever, and swollen lymph nodes Redness, blistering, peeling, or loosening of the skin, including inside the mouth Sudden or severe stomach pain, bloody diarrhea, fever, nausea, vomiting Side effects that usually do not require medical attention (report these to your care team  if they continue or are bothersome): Bone, joint, or muscle pain Diarrhea Fatigue Loss of appetite Nausea Skin rash This list may not describe all possible side effects. Call your doctor for medical advice about side effects. You may report side effects to FDA at 1-800-FDA-1088. Where should I keep my medication? This medication is given in a hospital or clinic. It will not be stored at home. NOTE: This sheet is a summary. It may not cover all possible information. If you have questions about this medicine, talk to your doctor, pharmacist, or health care provider.  2024 [employer]/Gold Standard (2021-08-08 00:00:00)

## 2023-12-27 NOTE — Assessment & Plan Note (Addendum)
 Images and pathology results were reviewed with the patient. cT2 N2 M0, Stage III right lung adenocarcinoma.  NGS showed TPS 5% CPS 10  KRAS G12C, TMB 12.6 Repeat CT chest w contrast 4 weeks after final RT.   Labs are reviewed and discussed with patient. Proceed with Durvalumab  Q 2weeks plan  for 1 year

## 2023-12-27 NOTE — Progress Notes (Signed)
 Hematology/Oncology Progress note [phone] Fax:([phone]) [phone]       CHIEF COMPLAINTS/PURPOSE OF CONSULTATION:  Stage III lung adenocarcinoam  ASSESSMENT & PLAN:   Cancer Staging  Primary lung adenocarcinoma, right [facility]) Staging form: Lung, AJCC V9 - Clinical stage from 08/15/2023: Tanya Howell, cM0 - Signed by Babara Call, MD on 08/15/2023   Primary lung adenocarcinoma, right [facility]) Images and pathology results were reviewed with the patient. cT2 N2 M0, Stage III right lung adenocarcinoma.  NGS showed TPS 5% CPS 10  KRAS G12C, TMB 12.6 Repeat CT chest w contrast 4 weeks after final RT.   Labs are reviewed and discussed with patient. Proceed with Durvalumab  Q 2weeks plan  for 1 year  Hypokalemia Recommend potassium chloride  20meq daily for 1 week then stop.    Pulmonary embolism (HCC) Anticoagulation with Eliquis  5mg  BID till Dec 2025, then decrease to 2.5mg  BID .    Encounter for antineoplastic immunotherapy Immunotherapy plan as list above  Cough Symptoms are mild. Possibly due to seasonal allergy. Recommend Claritin 10mg  daily.  Ddx: radiation or immunotherapy pneumonitis.  Obtain CXR     Orders Placed This Encounter  Procedures   DG Chest 2 View    Standing Status:   Future    Expected Date:   12/27/2023    Expiration Date:   03/26/2024    Reason for Exam (SYMPTOM  OR DIAGNOSIS REQUIRED):   cough    Preferred imaging location?:   [facility]   Follow-up 3 weeks  All questions were answered. The patient knows to call the clinic with any problems, questions or concerns.  Call Babara, MD, PhD [facility] Hematology Oncology 12/27/2023    HISTORY OF PRESENTING ILLNESS:  Tanya Howell 63 y.o. female presents to establish care for lung cancer  Oncology History  Primary lung adenocarcinoma, right (HCC)  08/03/2023 Imaging   CT angiogram chest PE protocol showed  1. 3.5 x 2.6 cm right hilar mass, with obstruction of the bronchus intermedius,  right middle lobe bronchus, and portions of the right lower lobe bronchus as above, highly concerning for neoplasm. Bronchoscopy is recommended for further evaluation. 2. Subcarinal adenopathy concerning for metastatic disease. 3. Dense medial segment consolidation within the right middle lobe with associated volume loss, consistent with atelectasis. 4. Prominent right lower lobe bronchiectasis, with fluid-filled bronchi and patchy right basilar consolidation consistent with combination of airspace disease and atelectasis. 5. No evidence of pulmonary embolus. 6. Aortic Atherosclerosis (ICD10-I70.0). Coronary artery atherosclerosis.   08/06/2023 Initial Diagnosis   Primary lung adenocarcinoma, right (HCC)  08/03/2023, patient presented to emergency room for evaluation of hemoptysis. Patient had pneumonia in March 2025, she continues to have a lingering cough for the past months despite being treated with doxycycline  and prednisone . She coughed up small amount of bright red blood on 08/03/2023.  Denies any shortness of breath, chest pain unintentional weight loss headache.  She felt feverish with chills 2 nights ago. Patient is a former smoker, quit smoking in 2011. Patient denies being on any antiplatelet or anticoagulation agents.  08/08/2023 She underwent biopsy via bronchoscopy.  FINAL MICROSCOPIC DIAGNOSIS:  A. LUNG, RLL, BRUSHING:  - Adenocarcinoma   B. LUNG, RLL, FINE NEEDLE ASPIRATION:  - Adenocarcinoma   Immunohistochemical stains were performed to characterize the tumor cells. The cells are negative for TTF-1, Napsin A, p40, synaptophysin, Chromogranin A, and CD56.  Mucicarmine stain highlights intracytoplasmic mucin. Ki67 proliferation index is approximately 30%. The overall findings are supportive of the diagnosis of adenocarcinoma.  C. LYMPH NODE, 7, FINE NEEDLE ASPIRATION:  - Adenocarcinoma  - Lymphoid tissue present   D. LYMPH NODE, 4R, FINE NEEDLE ASPIRATION:  - No  malignant cells identified  - Lymphoid tissue present   E. LYMPH NODE, 11R, FINE NEEDLE ASPIRATION:  - No malignant cells identified  - Lymphoid tissue present   F. LUNG, RLL, LAVAGE:  FINAL MICROSCOPIC DIAGNOSIS:  - Atypical cells present    08/15/2023 Cancer Staging   Staging form: Lung, AJCC V9 - Clinical stage from 08/15/2023: Tanya Howell, cM0 - Signed by Babara Call, MD on 08/15/2023 Stage prefix: Initial diagnosis Method of lymph node assessment: Clinical   09/09/2023 - 10/21/2023 Chemotherapy   Patient is on Treatment Plan : LUNG Carboplatin  + Paclitaxel  + XRT q7d     11/22/2023 Imaging   CT chest with contrast   1. Diminished size subcarinal and right hilar node or mass, measuring 1.5 x 0.9 cm, previously 3.4 x 3.2 cm. Findings are consistent with treatment response. 2. No evidence of new or progressive disease in the chest. 3. Coronary artery disease   11/29/2023 -  Chemotherapy   Patient is on Treatment Plan : LUNG Durvalumab  (10) q14d      09/23/2023 diagnosis of Segmental pulmonary emboli in the lingula. She takes Eliquis  5mg  BID  She has no new concerns.  No additional episodes of hemoptysis. No additional episodes of hemoptysis.  She tolerates immunotherapy.  + mild dry cough for a few days.    MEDICAL HISTORY:  Past Medical History:  Diagnosis Date   COVID-19 07/2018   Hyperlipidemia    Hypertension    Lung cancer (HCC) 07/2023   Motion sickness    amuesment park rides   Osteopenia 11/01/2015   Pneumonia    april 2025   Psoriasis    Vertigo    none for over 5 yrs   Wears dentures    full upper and lower    SURGICAL HISTORY: Past Surgical History:  Procedure Laterality Date   BRONCHIAL BIOPSY  08/08/2023   Procedure: BRONCHOSCOPY, WITH BIOPSY;  Surgeon: Malka Domino, MD;  Location: [facility] ENDOSCOPY;  Service: Pulmonary;;   BRONCHIAL BRUSHINGS  08/08/2023   Procedure: BRONCHOSCOPY, WITH BRUSH BIOPSY;  Surgeon: Malka Domino, MD;  Location: [facility]  ENDOSCOPY;  Service: Pulmonary;;   BRONCHIAL NEEDLE ASPIRATION BIOPSY  08/08/2023   Procedure: BRONCHOSCOPY, WITH NEEDLE ASPIRATION BIOPSY;  Surgeon: Malka Domino, MD;  Location: [facility] ENDOSCOPY;  Service: Pulmonary;;   CHOLECYSTECTOMY  1990   COLONOSCOPY WITH PROPOFOL  N/A 07/15/2020   Procedure: COLONOSCOPY WITH PROPOFOL ;  Surgeon: Jinny Carmine, MD;  Location: [facility];  Service: Endoscopy;  Laterality: N/A;  priority 4   ENDOBRONCHIAL ULTRASOUND Bilateral 08/08/2023   Procedure: ENDOBRONCHIAL ULTRASOUND (EBUS);  Surgeon: Malka Domino, MD;  Location: [facility] ENDOSCOPY;  Service: Pulmonary;  Laterality: Bilateral;   IR IMAGING GUIDED PORT INSERTION  08/29/2023   TUBAL LIGATION      SOCIAL HISTORY: Social History   Socioeconomic History   Marital status: Married    Spouse name: Oneil   Number of children: 1   Years of education: Not on file   Highest education level: High school graduate  Occupational History   Not on file  Tobacco Use   Smoking status: Former    Current packs/day: 0.00    Average packs/day: 1 pack/day for 28.0 years (28.0 ttl pk-yrs)    Types: Cigarettes    Start date: 5    Quit date: 2011    Years  since quitting: 14.7   Smokeless tobacco: Never   Tobacco comments:    Started smoking around 63 yrs old.    Smoked 1PPD at her heaviest.    Quit smoking in 2011- leda 08/07/2023  Vaping Use   Vaping status: Former  Substance and Sexual Activity   Alcohol use: No    Alcohol/week: 0.0 standard drinks of alcohol    Comment: rare   Drug use: No   Sexual activity: Yes    Birth control/protection: Surgical  Other Topics Concern   Not on file  Social History Narrative   Not on file   Social Drivers of Health   Financial Resource Strain: Low Risk  (05/06/2018)   Overall Financial Resource Strain (CARDIA)    Difficulty of Paying Living Expenses: Not hard at all  Food Insecurity: No Food Insecurity (09/24/2023)   Hunger Vital Sign    Worried About  Running Out of Food in the Last Year: Never true    Ran Out of Food in the Last Year: Never true  Transportation Needs: No Transportation Needs (09/24/2023)   PRAPARE - Administrator, Civil Service (Medical): No    Lack of Transportation (Non-Medical): No  Physical Activity: Sufficiently Active (05/06/2018)   Exercise Vital Sign    Days of Exercise per Week: 7 days    Minutes of Exercise per Session: 30 min  Stress: No Stress Concern Present (05/06/2018)   [employer] of Occupational Health - Occupational Stress Questionnaire    Feeling of Stress : Not at all  Social Connections: Moderately Isolated (09/24/2023)   Social Connection and Isolation Panel    Frequency of Communication with Friends and Family: More than three times a week    Frequency of Social Gatherings with Friends and Family: Twice a week    Attends Religious Services: Never    Database administrator or Organizations: No    Attends Banker Meetings: Never    Marital Status: Married  Catering manager Violence: Not At Risk (09/24/2023)   Humiliation, Afraid, Rape, and Kick questionnaire    Fear of Current or Ex-Partner: No    Emotionally Abused: No    Physically Abused: No    Sexually Abused: No    FAMILY HISTORY: Family History  Problem Relation Age of Onset   Colon cancer Brother        39    Breast cancer Mother    Breast cancer Maternal Aunt    Pneumonia Father    Diabetes Maternal Grandmother     ALLERGIES:  is allergic to lisinopril .  MEDICATIONS:  Current Outpatient Medications  Medication Sig Dispense Refill   apixaban  (ELIQUIS ) 5 MG TABS tablet Take 1 tablet (5 mg total) by mouth 2 (two) times daily. 60 tablet 4   atorvastatin  (LIPITOR) 10 MG tablet TAKE 1 TABLET BY MOUTH AT BEDTIME 90 tablet 0   benzonatate  (TESSALON  PERLES) 100 MG capsule Take 1 capsule (100 mg total) by mouth 2 (two) times daily as needed for cough. 30 capsule 1   guaifenesin  (ROBITUSSIN) 100 MG/5ML  syrup Take 200 mg by mouth 3 (three) times daily as needed for cough.     guaiFENesin -codeine  100-10 MG/5ML syrup Take 5 mLs by mouth every 6 (six) hours as needed for cough. 118 mL 0   lansoprazole  (PREVACID ) 30 MG capsule Take 1 capsule (30 mg total) by mouth daily at 12 noon. 30 capsule 0   levalbuterol  (XOPENEX  HFA) 45 MCG/ACT inhaler Inhale 2 puffs  into the lungs every 6 (six) hours as needed for wheezing. 15 g 3   lidocaine -prilocaine  (EMLA ) cream Apply to affected area once 30 g 3   losartan  (COZAAR ) 25 MG tablet Take 1/2 (one-half) tablet by mouth once daily 45 tablet 0   meclizine  (ANTIVERT ) 25 MG tablet Take 1 tablet (25 mg total) by mouth 3 (three) times daily as needed for dizziness. 30 tablet 3   ondansetron  (ZOFRAN ) 8 MG tablet Take 1 tablet (8 mg total) by mouth every 8 (eight) hours as needed for nausea or vomiting. Start on the third day after chemotherapy. 30 tablet 1   prochlorperazine  (COMPAZINE ) 10 MG tablet Take 1 tablet (10 mg total) by mouth every 6 (six) hours as needed for nausea or vomiting. 30 tablet 1   sucralfate  (CARAFATE ) 1 g tablet Take 1 tablet (1 g total) by mouth 3 (three) times daily before meals. 90 tablet 1   triamcinolone  ointment (KENALOG ) 0.5 % Apply 1 Application topically 2 (two) times daily. 30 g 6   No current facility-administered medications for this visit.   Facility-Administered Medications Ordered in Other Visits  Medication Dose Route Frequency Provider Last Rate Last Admin   0.9 %  sodium chloride  infusion   Intravenous Continuous Babara Call, MD       durvalumab  (IMFINZI ) 740 mg in sodium chloride  0.9 % 100 mL chemo infusion  10 mg/kg (Treatment Plan Recorded) Intravenous Once Babara Call, MD        Review of Systems  Constitutional:  Negative for appetite change, chills, fatigue and fever.  HENT:   Negative for hearing loss and voice change.   Eyes:  Negative for eye problems.  Respiratory:  Negative for chest tightness, cough and hemoptysis.    Cardiovascular:  Negative for chest pain.  Gastrointestinal:  Negative for abdominal distention, abdominal pain and blood in stool.  Endocrine: Negative for hot flashes.  Genitourinary:  Negative for difficulty urinating and frequency.   Musculoskeletal:  Negative for arthralgias.  Skin:  Negative for itching and rash.  Neurological:  Negative for extremity weakness.  Hematological:  Negative for adenopathy.  Psychiatric/Behavioral:  Negative for confusion.      PHYSICAL EXAMINATION: ECOG PERFORMANCE STATUS: 0 - Asymptomatic  Vitals:   12/27/23 0859  BP: 129/86  Pulse: 84  Resp: 18  Temp: (!) 97 F (36.1 C)  SpO2: 99%   Filed Weights   12/27/23 0859  Weight: 172 lb 8 oz (78.2 kg)    Physical Exam Constitutional:      General: She is not in acute distress.    Appearance: She is not diaphoretic.  HENT:     Head: Normocephalic and atraumatic.  Eyes:     General: No scleral icterus. Cardiovascular:     Rate and Rhythm: Normal rate and regular rhythm.     Heart sounds: No murmur heard. Pulmonary:     Effort: Pulmonary effort is normal. No respiratory distress.     Breath sounds: No wheezing.  Abdominal:     General: There is no distension.     Palpations: Abdomen is soft.     Tenderness: There is no abdominal tenderness.  Musculoskeletal:        General: Normal range of motion.     Cervical back: Normal range of motion and neck supple.  Skin:    General: Skin is warm and dry.     Findings: No erythema.  Neurological:     Mental Status: She is alert and oriented to  person, place, and time. Mental status is at baseline.     Motor: No abnormal muscle tone.  Psychiatric:        Mood and Affect: Affect normal.      LABORATORY DATA:  I have reviewed the data as listed    Latest Ref Rng & Units 12/27/2023    8:43 AM 12/13/2023    8:48 AM 11/29/2023    8:22 AM  CBC  WBC 4.0 - 10.5 K/uL 5.1  4.5  3.6   Hemoglobin 12.0 - 15.0 g/dL 88.7  89.1  88.6   Hematocrit  36.0 - 46.0 % 32.9  32.7  33.7   Platelets 150 - 400 K/uL 267  250  174       Latest Ref Rng & Units 12/27/2023    8:43 AM 12/13/2023    8:48 AM 11/29/2023    8:22 AM  CMP  Glucose 70 - 99 mg/dL 891  896  866   BUN 8 - 23 mg/dL 11  13  14    Creatinine 0.44 - 1.00 mg/dL 9.23  9.25  9.13   Sodium 135 - 145 mmol/L 138  137  136   Potassium 3.5 - 5.1 mmol/L 3.4  3.7  3.8   Chloride 98 - 111 mmol/L 106  105  105   CO2 22 - 32 mmol/L 23  21  23    Calcium  8.9 - 10.3 mg/dL 9.2  9.1  9.3   Total Protein 6.5 - 8.1 g/dL 6.8  6.9  7.0   Total Bilirubin 0.0 - 1.2 mg/dL 0.6  0.6  0.8   Alkaline Phos 38 - 126 U/L 69  67  65   AST 15 - 41 U/L 23  19  24    ALT 0 - 44 U/L 22  22  37      RADIOGRAPHIC STUDIES: I have personally reviewed the radiological images as listed and agreed with the findings in the report. No results found.

## 2023-12-27 NOTE — Assessment & Plan Note (Signed)
 Symptoms are mild. Possibly due to seasonal allergy. Recommend Claritin 10mg  daily.  Ddx: radiation or immunotherapy pneumonitis.  Obtain CXR

## 2023-12-27 NOTE — Assessment & Plan Note (Signed)
 Anticoagulation with Eliquis  5mg  BID till Dec 2025, then decrease to 2.5mg  BID .

## 2023-12-27 NOTE — Assessment & Plan Note (Signed)
 Recommend potassium chloride  20meq daily for 1 week then stop.

## 2023-12-27 NOTE — Assessment & Plan Note (Signed)
 Immunotherapy plan as list above

## 2023-12-29 ENCOUNTER — Other Ambulatory Visit: Payer: Self-pay | Admitting: Family Medicine

## 2023-12-29 LAB — T4: T4, Total: 7.6 ug/dL (ref 4.5–12.0)

## 2023-12-30 NOTE — Telephone Encounter (Signed)
 Requested medications are due for refill today.  yes  Requested medications are on the active medications list.  yes  Last refill. 10/03/2023 #45 0 rf  Future visit scheduled.   no  Notes to clinic.  Abnormal lab. Pt last seen 07/02/2023.     Requested Prescriptions  Pending Prescriptions Disp Refills   losartan  (COZAAR ) 25 MG tablet [Pharmacy Med Name: Losartan  Potassium 25 MG Oral Tablet] 45 tablet 0    Sig: Take 1/2 (one-half) tablet by mouth once daily     Cardiovascular:  Angiotensin Receptor Blockers Failed - 12/30/2023  5:39 PM      Failed - K in normal range and within 180 days    Potassium  Date Value Ref Range Status  12/27/2023 3.4 (L) 3.5 - 5.1 mmol/L Final         Failed - Valid encounter within last 6 months    Recent Outpatient Visits           6 months ago Pneumonia of right lower lobe due to infectious organism   [facility], Megan P, DO   6 months ago Primary hypertension   [facility], Orion, DO              Passed - Cr in normal range and within 180 days    Creatinine  Date Value Ref Range Status  12/27/2023 0.76 0.44 - 1.00 mg/dL Final   Creat  Date Value Ref Range Status  11/11/2019 0.91 0.50 - 1.05 mg/dL Final    Comment:    For patients >43 years of age, the reference limit for Creatinine is approximately 13% higher for people identified as African-American. SABRA Amy - Patient is not pregnant      Passed - Last BP in normal range    BP Readings from Last 1 Encounters:  12/27/23 129/80

## 2023-12-31 NOTE — Telephone Encounter (Signed)
Patient is due for an appointment. Please call to schedule and then route to provider for refill.

## 2024-01-02 ENCOUNTER — Telehealth: Payer: Self-pay | Admitting: Family Medicine

## 2024-01-02 ENCOUNTER — Ambulatory Visit: Admitting: Family Medicine

## 2024-01-02 NOTE — Telephone Encounter (Signed)
 Copied from CRM #[id number]. Topic: Clinical - Prescription Issue >> Jan 02, 2024  9:13 AM Tonda B wrote: Reason for CRM: pt calling asking for samples of rx losartan  (COZAAR ) 25 MG tablet  until her appt in 02/2024

## 2024-01-03 NOTE — Telephone Encounter (Signed)
 No. It's a generic medication.

## 2024-01-06 ENCOUNTER — Ambulatory Visit (INDEPENDENT_AMBULATORY_CARE_PROVIDER_SITE_OTHER): Admitting: Pulmonary Disease

## 2024-01-06 DIAGNOSIS — R052 Subacute cough: Secondary | ICD-10-CM | POA: Diagnosis not present

## 2024-01-06 LAB — PULMONARY FUNCTION TEST
DL/VA % pred: 100 %
DL/VA: 4.21 ml/min/mmHg/L
DLCO cor % pred: 80 %
DLCO cor: 15.99 ml/min/mmHg
DLCO unc % pred: 74 %
DLCO unc: 14.79 ml/min/mmHg
FEF 25-75 Post: 3.73 L/s
FEF 25-75 Pre: 3.21 L/s
FEF2575-%Change-Post: 15 %
FEF2575-%Pred-Post: 164 %
FEF2575-%Pred-Pre: 141 %
FEV1-%Change-Post: 2 %
FEV1-%Pred-Post: 91 %
FEV1-%Pred-Pre: 88 %
FEV1-Post: 2.28 L
FEV1-Pre: 2.22 L
FEV1FVC-%Change-Post: 4 %
FEV1FVC-%Pred-Pre: 110 %
FEV6-%Change-Post: -1 %
FEV6-%Pred-Post: 81 %
FEV6-%Pred-Pre: 82 %
FEV6-Post: 2.54 L
FEV6-Pre: 2.58 L
FEV6FVC-%Pred-Post: 103 %
FEV6FVC-%Pred-Pre: 103 %
FVC-%Change-Post: -1 %
FVC-%Pred-Post: 78 %
FVC-%Pred-Pre: 79 %
FVC-Post: 2.54 L
FVC-Pre: 2.58 L
Post FEV1/FVC ratio: 90 %
Post FEV6/FVC ratio: 100 %
Pre FEV1/FVC ratio: 86 %
Pre FEV6/FVC Ratio: 100 %
RV % pred: 86 %
RV: 1.76 L
TLC % pred: 85 %
TLC: 4.3 L

## 2024-01-06 NOTE — Patient Instructions (Signed)
 Full PFT completed today ? ?

## 2024-01-06 NOTE — Progress Notes (Signed)
 Full PFT completed today ? ?

## 2024-01-10 ENCOUNTER — Inpatient Hospital Stay

## 2024-01-10 ENCOUNTER — Encounter: Payer: Self-pay | Admitting: Oncology

## 2024-01-10 ENCOUNTER — Inpatient Hospital Stay (HOSPITAL_BASED_OUTPATIENT_CLINIC_OR_DEPARTMENT_OTHER): Admitting: Oncology

## 2024-01-10 ENCOUNTER — Inpatient Hospital Stay: Attending: Oncology

## 2024-01-10 VITALS — BP 127/83 | HR 102 | Temp 97.9°F | Resp 17 | Wt 171.0 lb

## 2024-01-10 VITALS — BP 138/76 | HR 102

## 2024-01-10 DIAGNOSIS — Z87891 Personal history of nicotine dependence: Secondary | ICD-10-CM | POA: Insufficient documentation

## 2024-01-10 DIAGNOSIS — Z7901 Long term (current) use of anticoagulants: Secondary | ICD-10-CM | POA: Insufficient documentation

## 2024-01-10 DIAGNOSIS — M8580 Other specified disorders of bone density and structure, unspecified site: Secondary | ICD-10-CM | POA: Insufficient documentation

## 2024-01-10 DIAGNOSIS — C3491 Malignant neoplasm of unspecified part of right bronchus or lung: Secondary | ICD-10-CM

## 2024-01-10 DIAGNOSIS — Z79620 Long term (current) use of immunosuppressive biologic: Secondary | ICD-10-CM | POA: Insufficient documentation

## 2024-01-10 DIAGNOSIS — Z5112 Encounter for antineoplastic immunotherapy: Secondary | ICD-10-CM

## 2024-01-10 DIAGNOSIS — Z23 Encounter for immunization: Secondary | ICD-10-CM | POA: Insufficient documentation

## 2024-01-10 DIAGNOSIS — Z800 Family history of malignant neoplasm of digestive organs: Secondary | ICD-10-CM | POA: Insufficient documentation

## 2024-01-10 DIAGNOSIS — I2699 Other pulmonary embolism without acute cor pulmonale: Secondary | ICD-10-CM | POA: Insufficient documentation

## 2024-01-10 DIAGNOSIS — I2693 Single subsegmental pulmonary embolism without acute cor pulmonale: Secondary | ICD-10-CM

## 2024-01-10 DIAGNOSIS — Z803 Family history of malignant neoplasm of breast: Secondary | ICD-10-CM | POA: Diagnosis not present

## 2024-01-10 LAB — CMP (CANCER CENTER ONLY)
ALT: 23 U/L (ref 0–44)
AST: 31 U/L (ref 15–41)
Albumin: 4.2 g/dL (ref 3.5–5.0)
Alkaline Phosphatase: 77 U/L (ref 38–126)
Anion gap: 9 (ref 5–15)
BUN: 9 mg/dL (ref 8–23)
CO2: 22 mmol/L (ref 22–32)
Calcium: 9.3 mg/dL (ref 8.9–10.3)
Chloride: 108 mmol/L (ref 98–111)
Creatinine: 0.73 mg/dL (ref 0.44–1.00)
GFR, Estimated: >60 mL/min (ref >60)
Glucose, Bld: 103 mg/dL — ABNORMAL HIGH (ref 70–99)
Potassium: 3.9 mmol/L (ref 3.5–5.1)
Sodium: 139 mmol/L (ref 135–145)
Total Bilirubin: 0.6 mg/dL (ref 0.0–1.2)
Total Protein: 7 g/dL (ref 6.5–8.1)

## 2024-01-10 LAB — CBC WITH DIFFERENTIAL (CANCER CENTER ONLY)
Abs Immature Granulocytes: 0.03 K/uL (ref 0.00–0.07)
Basophils Absolute: 0.1 K/uL (ref 0.0–0.1)
Basophils Relative: 1 %
Eosinophils Absolute: 0.2 K/uL (ref 0.0–0.5)
Eosinophils Relative: 3 %
HCT: 34.6 % — ABNORMAL LOW (ref 36.0–46.0)
Hemoglobin: 11.9 g/dL — ABNORMAL LOW (ref 12.0–15.0)
Immature Granulocytes: 1 %
Lymphocytes Relative: 13 %
Lymphs Abs: 0.8 K/uL (ref 0.7–4.0)
MCH: 29.7 pg (ref 26.0–34.0)
MCHC: 34.4 g/dL (ref 30.0–36.0)
MCV: 86.3 fL (ref 80.0–100.0)
Monocytes Absolute: 0.7 K/uL (ref 0.1–1.0)
Monocytes Relative: 12 %
Neutro Abs: 4 K/uL (ref 1.7–7.7)
Neutrophils Relative %: 70 %
Platelet Count: 296 K/uL (ref 150–400)
RBC: 4.01 MIL/uL (ref 3.87–5.11)
RDW: 13 % (ref 11.5–15.5)
WBC Count: 5.7 K/uL (ref 4.0–10.5)
nRBC: 0 % (ref 0.0–0.2)

## 2024-01-10 MED ORDER — SODIUM CHLORIDE 0.9 % IV SOLN
INTRAVENOUS | Status: DC
  Filled 2024-01-10: qty 250

## 2024-01-10 MED ORDER — SODIUM CHLORIDE 0.9 % IV SOLN
10.0000 mg/kg | Freq: Once | INTRAVENOUS | Status: AC
  Administered 2024-01-10: 740 mg via INTRAVENOUS
  Filled 2024-01-10: qty 4.8

## 2024-01-10 NOTE — Progress Notes (Signed)
 Hematology/Oncology Progress note Telephone:([phone]) [phone] Fax:([phone]) [phone]       CHIEF COMPLAINTS/PURPOSE OF CONSULTATION:  Stage III lung adenocarcinoam  ASSESSMENT & PLAN:   Cancer Staging  Primary lung adenocarcinoma, right [facility]) Staging form: Lung, AJCC V9 - Clinical stage from 08/15/2023: Tanya Howell, cM0 - Signed by Babara Call, MD on 08/15/2023   Primary lung adenocarcinoma, right [facility]) Images and pathology results were reviewed with the patient. cT2 N2 M0, Stage III right lung adenocarcinoma.  NGS showed TPS 5% CPS 10  KRAS G12C, TMB 12.6 Repeat CT chest w contrast 4 weeks after final RT.   Labs are reviewed and discussed with patient. Proceed with Durvalumab  Q 2weeks   Encounter for antineoplastic immunotherapy Immunotherapy plan as list above  Pulmonary embolism (HCC) Anticoagulation with Eliquis  5mg  BID till Dec 2025, then decrease to 2.5mg  BID .      Orders Placed This Encounter  Procedures   CBC with Differential (Cancer Center Only)    Standing Status:   Future    Expected Date:   02/07/2024    Expiration Date:   02/06/2025   CMP (Cancer Center only)    Standing Status:   Future    Expected Date:   02/07/2024    Expiration Date:   02/06/2025   T4    Standing Status:   Future    Expected Date:   02/07/2024    Expiration Date:   02/06/2025   TSH    Standing Status:   Future    Expected Date:   02/07/2024    Expiration Date:   02/06/2025   Follow-up 3 weeks  All questions were answered. The patient knows to call the clinic with any problems, questions or concerns.  Call Babara, MD, PhD [facility] Hematology Oncology 01/10/2024    HISTORY OF PRESENTING ILLNESS:  Tanya Howell 63 y.o. female presents to establish care for lung cancer  Oncology History  Primary lung adenocarcinoma, right (HCC)  08/03/2023 Imaging   CT angiogram chest PE protocol showed  1. 3.5 x 2.6 cm right hilar mass, with obstruction of the bronchus intermedius, right middle  lobe bronchus, and portions of the right lower lobe bronchus as above, highly concerning for neoplasm. Bronchoscopy is recommended for further evaluation. 2. Subcarinal adenopathy concerning for metastatic disease. 3. Dense medial segment consolidation within the right middle lobe with associated volume loss, consistent with atelectasis. 4. Prominent right lower lobe bronchiectasis, with fluid-filled bronchi and patchy right basilar consolidation consistent with combination of airspace disease and atelectasis. 5. No evidence of pulmonary embolus. 6. Aortic Atherosclerosis (ICD10-I70.0). Coronary artery atherosclerosis.   08/06/2023 Initial Diagnosis   Primary lung adenocarcinoma, right (HCC)  08/03/2023, patient presented to emergency room for evaluation of hemoptysis. Patient had pneumonia in March 2025, she continues to have a lingering cough for the past months despite being treated with doxycycline  and prednisone . She coughed up small amount of bright red blood on 08/03/2023.  Denies any shortness of breath, chest pain unintentional weight loss headache.  She felt feverish with chills 2 nights ago. Patient is a former smoker, quit smoking in 2011. Patient denies being on any antiplatelet or anticoagulation agents.  08/08/2023 She underwent biopsy via bronchoscopy.  FINAL MICROSCOPIC DIAGNOSIS:  A. LUNG, RLL, BRUSHING:  - Adenocarcinoma   B. LUNG, RLL, FINE NEEDLE ASPIRATION:  - Adenocarcinoma   Immunohistochemical stains were performed to characterize the tumor cells. The cells are negative for TTF-1, Napsin A, p40, synaptophysin, Chromogranin A, and CD56.  Mucicarmine  stain highlights intracytoplasmic mucin. Ki67 proliferation index is approximately 30%. The overall findings are supportive of the diagnosis of adenocarcinoma.   C. LYMPH NODE, 7, FINE NEEDLE ASPIRATION:  - Adenocarcinoma  - Lymphoid tissue present   D. LYMPH NODE, 4R, FINE NEEDLE ASPIRATION:  - No malignant  cells identified  - Lymphoid tissue present   E. LYMPH NODE, 11R, FINE NEEDLE ASPIRATION:  - No malignant cells identified  - Lymphoid tissue present   F. LUNG, RLL, LAVAGE:  FINAL MICROSCOPIC DIAGNOSIS:  - Atypical cells present    08/15/2023 Cancer Staging   Staging form: Lung, AJCC V9 - Clinical stage from 08/15/2023: Tanya Howell, cM0 - Signed by Babara Call, MD on 08/15/2023 Stage prefix: Initial diagnosis Method of lymph node assessment: Clinical   09/09/2023 - 10/21/2023 Chemotherapy   Patient is on Treatment Plan : LUNG Carboplatin  + Paclitaxel  + XRT q7d     11/22/2023 Imaging   CT chest with contrast   1. Diminished size subcarinal and right hilar node or mass, measuring 1.5 x 0.9 cm, previously 3.4 x 3.2 cm. Findings are consistent with treatment response. 2. No evidence of new or progressive disease in the chest. 3. Coronary artery disease   11/29/2023 -  Chemotherapy   Patient is on Treatment Plan : LUNG Durvalumab  (10) q14d      09/23/2023 diagnosis of Segmental pulmonary emboli in the lingula. She takes Eliquis  5mg  BID  She has no new concerns.   She tolerates immunotherapy.  Cough has improved.    MEDICAL HISTORY:  Past Medical History:  Diagnosis Date   COVID-19 07/2018   Hyperlipidemia    Hypertension    Lung cancer (HCC) 07/2023   Motion sickness    amuesment park rides   Osteopenia 11/01/2015   Pneumonia    april 2025   Psoriasis    Vertigo    none for over 5 yrs   Wears dentures    full upper and lower    SURGICAL HISTORY: Past Surgical History:  Procedure Laterality Date   BRONCHIAL BIOPSY  08/08/2023   Procedure: BRONCHOSCOPY, WITH BIOPSY;  Surgeon: Malka Domino, MD;  Location: [facility] END[facility];  Service: Pulmonary;;   BRONCHIAL BRUSHINGS  08/08/2023   Procedure: BRONCHOSCOPY, WITH BRUSH BIOPSY;  Surgeon: Malka Domino, MD;  Location: [facility] ENDOSCOPY;  Service: Pulmonary;;   BRONCHIAL NEEDLE ASPIRATION BIOPSY  08/08/2023   Procedure:  BRONCHOSCOPY, WITH NEEDLE ASPIRATION BIOPSY;  Surgeon: Malka Domino, MD;  Location: [facility] ENDOSCOPY;  Service: Pulmonary;;   CHOLECYSTECTOMY  1990   COLONOSCOPY WITH PROPOFOL  N/A 07/15/2020   Procedure: COLONOSCOPY WITH PROPOFOL ;  Surgeon: Jinny Carmine, MD;  [facility];  Service: Endoscopy;  Laterality: N/A;  priority 4   ENDOBRONCHIAL ULTRASOUND Bilateral 08/08/2023   Procedure: ENDOBRONCHIAL ULTRASOUND (EBUS);  Surgeon: Malka Domino, MD;  Location: [facility] ENDOSCOPY;  Service: Pulmonary;  Laterality: Bilateral;   IR IMAGING GUIDED PORT INSERTION  08/29/2023   TUBAL LIGATION      SOCIAL HISTORY: Social History   Socioeconomic History   Marital status: Married    Spouse name: Oneil   Number of children: 1   Years of education: Not on file   Highest education level: High school graduate  Occupational History   Not on file  Tobacco Use   Smoking status: Former    Current packs/day: 0.00    Average packs/day: 1 pack/day for 28.0 years (28.0 ttl pk-yrs)    Types: Cigarettes    Start date: 21  Quit date: 2011    Years since quitting: 14.7   Smokeless tobacco: Never   Tobacco comments:    Started smoking around 63 yrs old.    Smoked 1PPD at her heaviest.    Quit smoking in 2011- leda 08/07/2023  Vaping Use   Vaping status: Former  Substance and Sexual Activity   Alcohol use: No    Alcohol/week: 0.0 standard drinks of alcohol    Comment: rare   Drug use: No   Sexual activity: Yes    Birth control/protection: Surgical  Other Topics Concern   Not on file  Social History Narrative   Not on file   Social Drivers of Health   Financial Resource Strain: Low Risk  (05/06/2018)   Overall Financial Resource Strain (CARDIA)    Difficulty of Paying Living Expenses: Not hard at all  Food Insecurity: No Food Insecurity (09/24/2023)   Hunger Vital Sign    Worried About Running Out of Food in the Last Year: Never true    Ran Out of Food in the Last Year: Never  true  Transportation Needs: No Transportation Needs (09/24/2023)   PRAPARE - Administrator, Civil Service (Medical): No    Lack of Transportation (Non-Medical): No  Physical Activity: Sufficiently Active (05/06/2018)   Exercise Vital Sign    Days of Exercise per Week: 7 days    Minutes of Exercise per Session: 30 min  Stress: No Stress Concern Present (05/06/2018)   [employer] of Occupational Health - Occupational Stress Questionnaire    Feeling of Stress : Not at all  Social Connections: Moderately Isolated (09/24/2023)   Social Connection and Isolation Panel    Frequency of Communication with Friends and Family: More than three times a week    Frequency of Social Gatherings with Friends and Family: Twice a week    Attends Religious Services: Never    Database administrator or Organizations: No    Attends Banker Meetings: Never    Marital Status: Married  Catering manager Violence: Not At Risk (09/24/2023)   Humiliation, Afraid, Rape, and Kick questionnaire    Fear of Current or Ex-Partner: No    Emotionally Abused: No    Physically Abused: No    Sexually Abused: No    FAMILY HISTORY: Family History  Problem Relation Age of Onset   Colon cancer Brother        28    Breast cancer Mother    Breast cancer Maternal Aunt    Pneumonia Father    Diabetes Maternal Grandmother     ALLERGIES:  is allergic to lisinopril .  MEDICATIONS:  Current Outpatient Medications  Medication Sig Dispense Refill   apixaban  (ELIQUIS ) 5 MG TABS tablet Take 1 tablet (5 mg total) by mouth 2 (two) times daily. 60 tablet 4   atorvastatin  (LIPITOR) 10 MG tablet TAKE 1 TABLET BY MOUTH AT BEDTIME 90 tablet 0   benzonatate  (TESSALON  PERLES) 100 MG capsule Take 1 capsule (100 mg total) by mouth 2 (two) times daily as needed for cough. 30 capsule 1   guaifenesin  (ROBITUSSIN) 100 MG/5ML syrup Take 200 mg by mouth 3 (three) times daily as needed for cough.     guaiFENesin -codeine   100-10 MG/5ML syrup Take 5 mLs by mouth every 6 (six) hours as needed for cough. 118 mL 0   lansoprazole  (PREVACID ) 30 MG capsule Take 1 capsule (30 mg total) by mouth daily at 12 noon. 30 capsule 0   levalbuterol  (XOPENEX   HFA) 45 MCG/ACT inhaler Inhale 2 puffs into the lungs every 6 (six) hours as needed for wheezing. 15 g 3   lidocaine -prilocaine  (EMLA ) cream Apply to affected area once 30 g 3   losartan  (COZAAR ) 25 MG tablet Take 1/2 (one-half) tablet by mouth once daily 45 tablet 0   meclizine  (ANTIVERT ) 25 MG tablet Take 1 tablet (25 mg total) by mouth 3 (three) times daily as needed for dizziness. 30 tablet 3   ondansetron  (ZOFRAN ) 8 MG tablet Take 1 tablet (8 mg total) by mouth every 8 (eight) hours as needed for nausea or vomiting. Start on the third day after chemotherapy. 30 tablet 1   prochlorperazine  (COMPAZINE ) 10 MG tablet Take 1 tablet (10 mg total) by mouth every 6 (six) hours as needed for nausea or vomiting. 30 tablet 1   triamcinolone  ointment (KENALOG ) 0.5 % Apply 1 Application topically 2 (two) times daily. 30 g 6   sucralfate  (CARAFATE ) 1 g tablet Take 1 tablet (1 g total) by mouth 3 (three) times daily before meals. (Patient not taking: Reported on 01/10/2024) 90 tablet 1   No current facility-administered medications for this visit.    Review of Systems  Constitutional:  Negative for appetite change, chills, fatigue and fever.  HENT:   Negative for hearing loss and voice change.   Eyes:  Negative for eye problems.  Respiratory:  Negative for chest tightness, cough and hemoptysis.   Cardiovascular:  Negative for chest pain.  Gastrointestinal:  Negative for abdominal distention, abdominal pain and blood in stool.  Endocrine: Negative for hot flashes.  Genitourinary:  Negative for difficulty urinating and frequency.   Musculoskeletal:  Negative for arthralgias.  Skin:  Negative for itching and rash.  Neurological:  Negative for extremity weakness.  Hematological:   Negative for adenopathy.  Psychiatric/Behavioral:  Negative for confusion.      PHYSICAL EXAMINATION: ECOG PERFORMANCE STATUS: 0 - Asymptomatic  Vitals:   01/10/24 0903  BP: 127/83  Pulse: (!) 102  Resp: 17  Temp: 97.9 F (36.6 C)  SpO2: 98%   Filed Weights   01/10/24 0903  Weight: 171 lb (77.6 kg)    Physical Exam Constitutional:      General: She is not in acute distress.    Appearance: She is not diaphoretic.  HENT:     Head: Normocephalic and atraumatic.  Eyes:     General: No scleral icterus. Cardiovascular:     Rate and Rhythm: Normal rate and regular rhythm.     Heart sounds: No murmur heard. Pulmonary:     Effort: Pulmonary effort is normal. No respiratory distress.     Breath sounds: No wheezing.  Abdominal:     General: There is no distension.     Palpations: Abdomen is soft.     Tenderness: There is no abdominal tenderness.  Musculoskeletal:        General: Normal range of motion.     Cervical back: Normal range of motion and neck supple.  Skin:    General: Skin is warm and dry.     Findings: No erythema.  Neurological:     Mental Status: She is alert and oriented to person, place, and time. Mental status is at baseline.     Motor: No abnormal muscle tone.  Psychiatric:        Mood and Affect: Affect normal.      LABORATORY DATA:  I have reviewed the data as listed    Latest Ref Rng & Units 01/10/2024  8:49 AM 12/27/2023    8:43 AM 12/13/2023    8:48 AM  CBC  WBC 4.0 - 10.5 K/uL 5.7  5.1  4.5   Hemoglobin 12.0 - 15.0 g/dL 88.0  88.7  89.1   Hematocrit 36.0 - 46.0 % 34.6  32.9  32.7   Platelets 150 - 400 K/uL 296  267  250       Latest Ref Rng & Units 01/10/2024    8:49 AM 12/27/2023    8:43 AM 12/13/2023    8:48 AM  CMP  Glucose 70 - 99 mg/dL 896  891  896   BUN 8 - 23 mg/dL 9  11  13    Creatinine 0.44 - 1.00 mg/dL 9.26  9.23  9.25   Sodium 135 - 145 mmol/L 139  138  137   Potassium 3.5 - 5.1 mmol/L 3.9  3.4  3.7   Chloride 98 - 111  mmol/L 108  106  105   CO2 22 - 32 mmol/L 22  23  21    Calcium  8.9 - 10.3 mg/dL 9.3  9.2  9.1   Total Protein 6.5 - 8.1 g/dL 7.0  6.8  6.9   Total Bilirubin 0.0 - 1.2 mg/dL 0.6  0.6  0.6   Alkaline Phos 38 - 126 U/L 77  69  67   AST 15 - 41 U/L 31  23  19    ALT 0 - 44 U/L 23  22  22       RADIOGRAPHIC STUDIES: I have personally reviewed the radiological images as listed and agreed with the findings in the report. No results found.

## 2024-01-10 NOTE — Assessment & Plan Note (Signed)
 Immunotherapy plan as list above

## 2024-01-10 NOTE — Assessment & Plan Note (Signed)
 Anticoagulation with Eliquis  5mg  BID till Dec 2025, then decrease to 2.5mg  BID .

## 2024-01-10 NOTE — Patient Instructions (Signed)
 [facility] - A DEPT OF [facility]  Discharge Instructions: Thank you for choosing [facility] to provide your oncology and hematology care.  If you have a lab appointment with the Cancer Center, please go directly to the Cancer Center and check in at the registration area.  Wear comfortable clothing and clothing appropriate for easy access to any Portacath or PICC line.   We strive to give you quality time with your provider. You may need to reschedule your appointment if you arrive late (15 or more minutes).  Arriving late affects you and other patients whose appointments are after yours.  Also, if you miss three or more appointments without notifying the office, you may be dismissed from the clinic at the provider's discretion.      For prescription refill requests, have your pharmacy contact our office and allow 72 hours for refills to be completed.    Today you received the following chemotherapy and/or immunotherapy agents Imfinzi      To help prevent nausea and vomiting after your treatment, we encourage you to take your nausea medication as directed.  BELOW ARE SYMPTOMS THAT SHOULD BE REPORTED IMMEDIATELY: *FEVER GREATER THAN 100.4 F (38 C) OR HIGHER *CHILLS OR SWEATING *NAUSEA AND VOMITING THAT IS NOT CONTROLLED WITH YOUR NAUSEA MEDICATION *UNUSUAL SHORTNESS OF BREATH *UNUSUAL BRUISING OR BLEEDING *URINARY PROBLEMS (pain or burning when urinating, or frequent urination) *BOWEL PROBLEMS (unusual diarrhea, constipation, pain near the anus) TENDERNESS IN MOUTH AND THROAT WITH OR WITHOUT PRESENCE OF ULCERS (sore throat, sores in mouth, or a toothache) UNUSUAL RASH, SWELLING OR PAIN  UNUSUAL VAGINAL DISCHARGE OR ITCHING   Items with * indicate a potential emergency and should be followed up as soon as possible or go to the Emergency Department if any problems should occur.  Please show the CHEMOTHERAPY ALERT CARD or IMMUNOTHERAPY  ALERT CARD at check-in to the Emergency Department and triage nurse.  Should you have questions after your visit or need to cancel or reschedule your appointment, please contact [facility] - A DEPT OF [facility]  [phone] and follow the prompts.  Office hours are 8:00 a.m. to 4:30 p.m. Monday - Friday. Please note that voicemails left after 4:00 p.m. may not be returned until the following business day.  We are closed weekends and major holidays. You have access to a nurse at all times for urgent questions. Please call the main number to the clinic [phone] and follow the prompts.  For any non-urgent questions, you may also contact your provider using MyChart. We now offer e-Visits for anyone 1 and older to request care online for non-urgent symptoms. For details visit [email or web address].   Also download the MyChart app! Go to the app store, search "MyChart", open the app, select [place], and log in with your MyChart username and password.

## 2024-01-10 NOTE — Assessment & Plan Note (Addendum)
 Images and pathology results were reviewed with the patient. cT2 N2 M0, Stage III right lung adenocarcinoma.  NGS showed TPS 5% CPS 10  KRAS G12C, TMB 12.6 Repeat CT chest w contrast 4 weeks after final RT.   Labs are reviewed and discussed with patient. Proceed with Durvalumab  Q 2weeks

## 2024-01-17 ENCOUNTER — Ambulatory Visit: Admitting: Pulmonary Disease

## 2024-01-17 ENCOUNTER — Encounter: Payer: Self-pay | Admitting: Pulmonary Disease

## 2024-01-17 VITALS — BP 116/70 | HR 95 | Temp 97.1°F | Ht 64.0 in | Wt 170.6 lb

## 2024-01-17 DIAGNOSIS — I2699 Other pulmonary embolism without acute cor pulmonale: Secondary | ICD-10-CM

## 2024-01-17 DIAGNOSIS — C3491 Malignant neoplasm of unspecified part of right bronchus or lung: Secondary | ICD-10-CM | POA: Diagnosis not present

## 2024-01-17 DIAGNOSIS — R918 Other nonspecific abnormal finding of lung field: Secondary | ICD-10-CM | POA: Diagnosis not present

## 2024-01-17 NOTE — Progress Notes (Signed)
 Synopsis: Referred in by Vicci Duwaine SQUIBB, DO   Subjective:   PATIENT ID: Tanya Howell GENDER: female DOB: June 20, 1960, MRN: [id number]  Chief Complaint  Patient presents with   Medical Management of Chronic Issues    DOE. Little wheezing. Cough.    HPI Tanya Howell is a pleasant 63 year old female patient with a past medical history of hypertension, hyperlipidemia and history of tobacco use quit in 2011 presenting for evaluation of right hilar mass.  She started having cough with sputum production back in March 2025 and was prescribed a course of antibiotics with prednisone  however the coughing spell persisted and had 1 episode of hemoptysis which prompted her presentation to the ED on 0/26.  She underwent a chest x-ray that showed a right base airspace disease that prompted a CTA of the chest.  CTA of the chest 0/26/2025 showed 3.5 cm right hilar mass involving the bronchus intermedius in the right middle lobe bronchus and portions of the right lower lobe bronchi.  Highly suspicious for primary lung malignancy.  Besides a cough and 1 episode of hemoptysis she is asymptomatic denies any weight loss or loss of appetite.  Family history -she denies any lung cancer in the family.  Social history -ex-smoker quit in 2011 has 30 pack/year.  OV 10/02/2023 - Tanya Howell is here to follow up on a recent admission for hemoptysis and new dx of PE. 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 Course was complicated by hemoptysis which led to her presentation to the ED, CTA chest with segmental PE. Received TXA nebs and started on AC.   OV 01/17/2024 - Tanya Howell is here for follow up. She was diagnosed with cT2aN2 [Stage IIIA] Adenocarcinoma of the lung and completed chemotherapy and radiation. She is currenly on Durvalumab  every 2 weeks w/ plan to complete 2 years. Recent CT  chest in August 2025 reviewed with significant improvement and decrease in size of the hilar mass and mediastinal LAN. She continues with Eliquis  5 mg bid. She is stable from a respiratory standoing and has no major complaints. Her PFTs are overall reassuring.   ROS All systems were reviewed and are negative except for the above.  Objective:   Vitals:   01/17/24 0833  BP: 116/70  Pulse: 95  Temp: (!) 97.1 F (36.2 C)  SpO2: 95%  Weight: 170 lb 9.6 oz (77.4 kg)  Height: 5' 4 (1.626 m)   95% on  RA BMI Readings from Last 3 Encounters:  01/17/24 29.28 kg/m  01/10/24 29.35 kg/m  01/06/24 29.28 kg/m   Wt Readings from Last 3 Encounters:  01/17/24 170 lb 9.6 oz (77.4 kg)  01/10/24 171 lb (77.6 kg)  01/06/24 170 lb 9.6 oz (77.4 kg)    Physical Exam GEN: NAD, Healthy Appearing HEENT: Supple Neck, Reactive Pupils, EOMI  CVS: Normal S1, Normal S2, RRR, No murmurs or ES appreciated  Lungs: Rales appreciated bilaterally.  Abdomen: Soft, non tender, non distended, + BS  Extremities: Warm and well perfused, No edema  Skin: No suspicious lesions appreciated  Psych: Normal Affect  Ancillary Information   CBC    Component Value Date/Time   WBC 5.7 01/10/2024 0849   WBC 3.9 (L) 09/24/2023 0521   RBC 4.01 01/10/2024 0849   HGB 11.9 (L) 01/10/2024 0849   HGB 13.6 11/13/2022 1553   HCT 34.6 (L) 01/10/2024 0849   HCT 41.2 11/13/2022  1553   PLT 296 01/10/2024 0849   PLT 225 11/13/2022 1553   MCV 86.3 01/10/2024 0849   MCV 85 11/13/2022 1553   MCH 29.7 01/10/2024 0849   MCHC 34.4 01/10/2024 0849   RDW 13.0 01/10/2024 0849   RDW 13.0 11/13/2022 1553   LYMPHSABS 0.8 01/10/2024 0849   LYMPHSABS 1.7 11/13/2022 1553   MONOABS 0.7 01/10/2024 0849   EOSABS 0.2 01/10/2024 0849   EOSABS 0.1 11/13/2022 1553   BASOSABS 0.1 01/10/2024 0849   BASOSABS 0.1 11/13/2022 1553   Labs and imaging were reviewed    Latest Ref Rng & Units 01/06/2024    3:40 PM  PFT Results  FVC-Pre L 2.58   P  FVC-Predicted Pre % 79  P  FVC-Post L 2.54  P  FVC-Predicted Post % 78  P  Pre FEV1/FVC % % 86  P  Post FEV1/FCV % % 90  P  FEV1-Pre L 2.22  P  FEV1-Predicted Pre % 88  P  FEV1-Post L 2.28  P  DLCO uncorrected ml/min/mmHg 14.79  P  DLCO UNC% % 74  P  DLCO corrected ml/min/mmHg 15.99  P  DLCO COR %Predicted % 80  P  DLVA Predicted % 100  P  TLC L 4.30  P  TLC % Predicted % 85  P  RV % Predicted % 86  P    P Preliminary result     Assessment & Plan:  Tanya Howell is a pleasant 63 year old female patient with a past medical history of hypertension, hyperlipidemia and history of tobacco use quit in 2011 presenting for evaluation of right hilar mass.  #Stage IIIA Adenocarcinoma of the right lung 09/04/2023 completed Chemotherapy and radiation therapy. Currently on Durvalumab .  #Pulmonary embolism, segmental - completed loading with eliquis  and currently on 5mg  BID.   RTC 6 months.   I personally spent a total of 30 minutes in the care of the patient today including preparing to see the patient, getting/reviewing separately obtained history, performing a medically appropriate exam/evaluation, counseling and educating, documenting clinical information in the EHR, independently interpreting results, and communicating results.   Darrin Barn, MD [facility] 01/17/2024 10:54 AM

## 2024-01-23 ENCOUNTER — Encounter: Payer: Self-pay | Admitting: Oncology

## 2024-01-24 ENCOUNTER — Ambulatory Visit: Payer: Self-pay | Admitting: Oncology

## 2024-01-24 ENCOUNTER — Inpatient Hospital Stay

## 2024-01-24 ENCOUNTER — Inpatient Hospital Stay: Admitting: Oncology

## 2024-01-24 ENCOUNTER — Encounter: Payer: Self-pay | Admitting: Oncology

## 2024-01-24 VITALS — BP 108/68 | HR 103 | Resp 18

## 2024-01-24 VITALS — BP 128/81 | HR 96 | Temp 97.2°F | Resp 18 | Wt 167.0 lb

## 2024-01-24 DIAGNOSIS — C3491 Malignant neoplasm of unspecified part of right bronchus or lung: Secondary | ICD-10-CM

## 2024-01-24 DIAGNOSIS — Z23 Encounter for immunization: Secondary | ICD-10-CM

## 2024-01-24 DIAGNOSIS — Z5112 Encounter for antineoplastic immunotherapy: Secondary | ICD-10-CM

## 2024-01-24 DIAGNOSIS — I2693 Single subsegmental pulmonary embolism without acute cor pulmonale: Secondary | ICD-10-CM | POA: Diagnosis not present

## 2024-01-24 LAB — CBC WITH DIFFERENTIAL (CANCER CENTER ONLY)
Abs Immature Granulocytes: 0.02 K/uL (ref 0.00–0.07)
Basophils Absolute: 0.1 K/uL (ref 0.0–0.1)
Basophils Relative: 1 %
Eosinophils Absolute: 0.2 K/uL (ref 0.0–0.5)
Eosinophils Relative: 4 %
HCT: 35 % — ABNORMAL LOW (ref 36.0–46.0)
Hemoglobin: 11.8 g/dL — ABNORMAL LOW (ref 12.0–15.0)
Immature Granulocytes: 0 %
Lymphocytes Relative: 15 %
Lymphs Abs: 0.8 K/uL (ref 0.7–4.0)
MCH: 29 pg (ref 26.0–34.0)
MCHC: 33.7 g/dL (ref 30.0–36.0)
MCV: 86 fL (ref 80.0–100.0)
Monocytes Absolute: 0.7 K/uL (ref 0.1–1.0)
Monocytes Relative: 14 %
Neutro Abs: 3.4 K/uL (ref 1.7–7.7)
Neutrophils Relative %: 66 %
Platelet Count: 263 K/uL (ref 150–400)
RBC: 4.07 MIL/uL (ref 3.87–5.11)
RDW: 12.8 % (ref 11.5–15.5)
WBC Count: 5.1 K/uL (ref 4.0–10.5)
nRBC: 0 % (ref 0.0–0.2)

## 2024-01-24 LAB — CMP (CANCER CENTER ONLY)
ALT: 68 U/L — ABNORMAL HIGH (ref 0–44)
AST: 52 U/L — ABNORMAL HIGH (ref 15–41)
Albumin: 4 g/dL (ref 3.5–5.0)
Alkaline Phosphatase: 67 U/L (ref 38–126)
Anion gap: 9 (ref 5–15)
BUN: 13 mg/dL (ref 8–23)
CO2: 23 mmol/L (ref 22–32)
Calcium: 9.3 mg/dL (ref 8.9–10.3)
Chloride: 106 mmol/L (ref 98–111)
Creatinine: 0.65 mg/dL (ref 0.44–1.00)
GFR, Estimated: >60 mL/min (ref >60)
Glucose, Bld: 99 mg/dL (ref 70–99)
Potassium: 3.6 mmol/L (ref 3.5–5.1)
Sodium: 138 mmol/L (ref 135–145)
Total Bilirubin: 0.6 mg/dL (ref 0.0–1.2)
Total Protein: 6.9 g/dL (ref 6.5–8.1)

## 2024-01-24 MED ORDER — SODIUM CHLORIDE 0.9 % IV SOLN
INTRAVENOUS | Status: DC
  Filled 2024-01-24: qty 250

## 2024-01-24 MED ORDER — SODIUM CHLORIDE 0.9 % IV SOLN
10.0000 mg/kg | Freq: Once | INTRAVENOUS | Status: AC
  Administered 2024-01-24: 740 mg via INTRAVENOUS
  Filled 2024-01-24: qty 4.8

## 2024-01-24 MED ORDER — INFLUENZA VIRUS VACC SPLIT PF (FLUZONE) 0.5 ML IM SUSY
0.5000 mL | PREFILLED_SYRINGE | Freq: Once | INTRAMUSCULAR | Status: AC
  Administered 2024-01-24: 0.5 mL via INTRAMUSCULAR
  Filled 2024-01-24: qty 0.5

## 2024-01-24 NOTE — Assessment & Plan Note (Signed)
 Images and pathology results were reviewed with the patient. cT2 N2 M0, Stage III right lung adenocarcinoma.  NGS showed TPS 5% CPS 10  KRAS G12C, TMB 12.6 Repeat CT chest w contrast 4 weeks after final RT.   Labs are reviewed and discussed with patient. Proceed with Durvalumab  Q 2weeks

## 2024-01-24 NOTE — Assessment & Plan Note (Signed)
Influenza vaccination today 

## 2024-01-24 NOTE — Progress Notes (Signed)
 Hematology/Oncology Progress note [phone] [phone]       CHIEF COMPLAINTS/PURPOSE OF CONSULTATION:  Stage III lung adenocarcinoam  ASSESSMENT & PLAN:   Cancer Staging  Primary lung adenocarcinoma, right [facility]) Staging form: Lung, AJCC V9 - Clinical stage from 08/15/2023: Tanya Howell, cM0 - Signed by Babara Call, MD on 08/15/2023   Primary lung adenocarcinoma, right [facility]) Images and pathology results were reviewed with the patient. cT2 N2 M0, Stage III right lung adenocarcinoma.  NGS showed TPS 5% CPS 10  KRAS G12C, TMB 12.6 Repeat CT chest w contrast 4 weeks after final RT.   Labs are reviewed and discussed with patient. Proceed with Durvalumab  Q 2weeks   Encounter for antineoplastic immunotherapy Immunotherapy plan as list above  Pulmonary embolism (HCC) Anticoagulation with Eliquis  5mg  BID till Dec 2025, then decrease to 2.5mg  BID .    Need for prophylactic vaccination and inoculation against influenza Influenza vaccination today     Orders Placed This Encounter  Procedures   CBC with Differential (Cancer Center Only)    Standing Status:   Future    Expected Date:   02/21/2024    Expiration Date:   02/20/2025   CMP (Cancer Center only)    Standing Status:   Future    Expected Date:   02/21/2024    Expiration Date:   02/20/2025   CBC with Differential (Cancer Center Only)    Standing Status:   Future    Expected Date:   03/09/2024    Expiration Date:   03/09/2025   CMP (Cancer Center only)    Standing Status:   Future    Expected Date:   03/09/2024    Expiration Date:   03/09/2025   Follow-up 3 weeks  All questions were answered. The patient knows to call the clinic with any problems, questions or concerns.  Call Babara, MD, PhD [facility] Hematology Oncology 01/24/2024    HISTORY OF PRESENTING ILLNESS:  Tanya Howell 63 y.o. female presents to establish care for lung cancer  Oncology History  Primary lung adenocarcinoma, right  (HCC)  08/03/2023 Imaging   CT angiogram chest PE protocol showed  1. 3.5 x 2.6 cm right hilar mass, with obstruction of the bronchus intermedius, right middle lobe bronchus, and portions of the right lower lobe bronchus as above, highly concerning for neoplasm. Bronchoscopy is recommended for further evaluation. 2. Subcarinal adenopathy concerning for metastatic disease. 3. Dense medial segment consolidation within the right middle lobe with associated volume loss, consistent with atelectasis. 4. Prominent right lower lobe bronchiectasis, with fluid-filled bronchi and patchy right basilar consolidation consistent with combination of airspace disease and atelectasis. 5. No evidence of pulmonary embolus. 6. Aortic Atherosclerosis (ICD10-I70.0). Coronary artery atherosclerosis.   08/06/2023 Initial Diagnosis   Primary lung adenocarcinoma, right (HCC)  08/03/2023, patient presented to emergency room for evaluation of hemoptysis. Patient had pneumonia in March 2025, she continues to have a lingering cough for the past months despite being treated with doxycycline  and prednisone . She coughed up small amount of bright red blood on 08/03/2023.  Denies any shortness of breath, chest pain unintentional weight loss headache.  She felt feverish with chills 2 nights ago. Patient is a former smoker, quit smoking in 2011. Patient denies being on any antiplatelet or anticoagulation agents.  08/08/2023 She underwent biopsy via bronchoscopy.  FINAL MICROSCOPIC DIAGNOSIS:  A. LUNG, RLL, BRUSHING:  - Adenocarcinoma   B. LUNG, RLL, FINE NEEDLE ASPIRATION:  - Adenocarcinoma   Immunohistochemical stains were performed  to characterize the tumor cells. The cells are negative for TTF-1, Napsin A, p40, synaptophysin, Chromogranin A, and CD56.  Mucicarmine stain highlights intracytoplasmic mucin. Ki67 proliferation index is approximately 30%. The overall findings are supportive of the diagnosis of  adenocarcinoma.   C. LYMPH NODE, 7, FINE NEEDLE ASPIRATION:  - Adenocarcinoma  - Lymphoid tissue present   D. LYMPH NODE, 4R, FINE NEEDLE ASPIRATION:  - No malignant cells identified  - Lymphoid tissue present   E. LYMPH NODE, 11R, FINE NEEDLE ASPIRATION:  - No malignant cells identified  - Lymphoid tissue present   F. LUNG, RLL, LAVAGE:  FINAL MICROSCOPIC DIAGNOSIS:  - Atypical cells present    08/15/2023 Cancer Staging   Staging form: Lung, AJCC V9 - Clinical stage from 08/15/2023: Tanya Howell, cM0 - Signed by Babara Call, MD on 08/15/2023 Stage prefix: Initial diagnosis Method of lymph node assessment: Clinical   09/09/2023 - 10/21/2023 Chemotherapy   Patient is on Treatment Plan : LUNG Carboplatin  + Paclitaxel  + XRT q7d     11/22/2023 Imaging   CT chest with contrast   1. Diminished size subcarinal and right hilar node or mass, measuring 1.5 x 0.9 cm, previously 3.4 x 3.2 cm. Findings are consistent with treatment response. 2. No evidence of new or progressive disease in the chest. 3. Coronary artery disease   11/29/2023 -  Chemotherapy   Patient is on Treatment Plan : LUNG Durvalumab  (10) q14d      09/23/2023 diagnosis of Segmental pulmonary emboli in the lingula. She takes Eliquis  5mg  BID  She has no new concerns.   She tolerates immunotherapy.  Back from a trip to [place], she enjoyed the trip well.  Right side sciatic pain.   MEDICAL HISTORY:  Past Medical History:  Diagnosis Date   COVID-19 07/2018   Hyperlipidemia    Hypertension    Lung cancer (HCC) 07/2023   Motion sickness    amuesment park rides   Osteopenia 11/01/2015   Pneumonia    april 2025   Psoriasis    Vertigo    none for over 5 yrs   Wears dentures    full upper and lower    SURGICAL HISTORY: Past Surgical History:  Procedure Laterality Date   BRONCHIAL BIOPSY  08/08/2023   Procedure: BRONCHOSCOPY, WITH BIOPSY;  Surgeon: Malka Domino, MD;  Location: [facility];  Service: Pulmonary;;    BRONCHIAL BRUSHINGS  08/08/2023   Procedure: BRONCHOSCOPY, WITH BRUSH BIOPSY;  Surgeon: Malka Domino, MD;  Location: [facility] ENDOS[facility];  Service: Pulmonary;;   BRONCHIAL NEEDLE ASPIRATION BIOPSY  08/08/2023   Procedure: BRONCHOSCOPY, WITH NEEDLE ASPIRATION BIOPSY;  Surgeon: Malka Domino, MD;  Location: [facility] ENDOSCOPY;  Service: Pulmonary;;   CHOLECYSTECTOMY  1990   COLONOSCOPY WITH PROPOFOL  N/A 07/15/2020   Procedure: COLONOSCOPY WITH PROPOFOL ;  Surgeon: Jinny Carmine, MD;  Location[facility];  Service: Endoscopy;  Laterality: N/A;  priority 4   ENDOBRONCHIAL ULTRASOUND Bilateral 08/08/2023   Procedure: ENDOBRONCHIAL ULTRASOUND (EBUS);  Surgeon: Malka Domino, MD;  Location: [facility];  Service: Pulmonary;  Laterality: Bilateral;   IR IMAGING GUIDED PORT INSERTION  08/29/2023   TUBAL LIGATION      SOCIAL HISTORY: Social History   Socioeconomic History   Marital status: Married    Spouse name: Oneil   Number of children: 1   Years of education: Not on file   Highest education level: High school graduate  Occupational History   Not on file  Tobacco Use   Smoking status: Former  Current packs/day: 0.00    Average packs/day: 1 pack/day for 28.0 years (28.0 ttl pk-yrs)    Types: Cigarettes    Start date: 16    Quit date: 2011    Years since quitting: 14.8   Smokeless tobacco: Never   Tobacco comments:    Started smoking around 63 yrs old.    Smoked 1PPD at her heaviest.    Quit smoking in 2011- leda 08/07/2023  Vaping Use   Vaping status: Former  Substance and Sexual Activity   Alcohol use: No    Alcohol/week: 0.0 standard drinks of alcohol    Comment: rare   Drug use: No   Sexual activity: Yes    Birth control/protection: Surgical  Other Topics Concern   Not on file  Social History Narrative   Not on file   Social Drivers of Health   Financial Resource Strain: Low Risk  (05/06/2018)   Overall Financial Resource Strain (CARDIA)    Difficulty of  Paying Living Expenses: Not hard at all  Food Insecurity: No Food Insecurity (09/24/2023)   Hunger Vital Sign    Worried About Running Out of Food in the Last Year: Never true    Ran Out of Food in the Last Year: Never true  Transportation Needs: No Transportation Needs (09/24/2023)   PRAPARE - Administrator, Civil Service (Medical): No    Lack of Transportation (Non-Medical): No  Physical Activity: Sufficiently Active (05/06/2018)   Exercise Vital Sign    Days of Exercise per Week: 7 days    Minutes of Exercise per Session: 30 min  Stress: No Stress Concern Present (05/06/2018)   [employer] of Occupational Health - Occupational Stress Questionnaire    Feeling of Stress : Not at all  Social Connections: Moderately Isolated (09/24/2023)   Social Connection and Isolation Panel    Frequency of Communication with Friends and Family: More than three times a week    Frequency of Social Gatherings with Friends and Family: Twice a week    Attends Religious Services: Never    Database administrator or Organizations: No    Attends Banker Meetings: Never    Marital Status: Married  Catering manager Violence: Not At Risk (09/24/2023)   Humiliation, Afraid, Rape, and Kick questionnaire    Fear of Current or Ex-Partner: No    Emotionally Abused: No    Physically Abused: No    Sexually Abused: No    FAMILY HISTORY: Family History  Problem Relation Age of Onset   Colon cancer Brother        69    Breast cancer Mother    Breast cancer Maternal Aunt    Pneumonia Father    Diabetes Maternal Grandmother     ALLERGIES:  is allergic to lisinopril .  MEDICATIONS:  Current Outpatient Medications  Medication Sig Dispense Refill   apixaban  (ELIQUIS ) 5 MG TABS tablet Take 1 tablet (5 mg total) by mouth 2 (two) times daily. 60 tablet 4   atorvastatin  (LIPITOR) 10 MG tablet TAKE 1 TABLET BY MOUTH AT BEDTIME 90 tablet 0   levalbuterol  (XOPENEX  HFA) 45 MCG/ACT inhaler  Inhale 2 puffs into the lungs every 6 (six) hours as needed for wheezing. 15 g 3   lidocaine -prilocaine  (EMLA ) cream Apply to affected area once 30 g 3   losartan  (COZAAR ) 25 MG tablet Take 1/2 (one-half) tablet by mouth once daily 45 tablet 0   meclizine  (ANTIVERT ) 25 MG tablet Take 1 tablet (25  mg total) by mouth 3 (three) times daily as needed for dizziness. 30 tablet 3   triamcinolone  ointment (KENALOG ) 0.5 % Apply 1 Application topically 2 (two) times daily. 30 g 6   benzonatate  (TESSALON  PERLES) 100 MG capsule Take 1 capsule (100 mg total) by mouth 2 (two) times daily as needed for cough. (Patient not taking: Reported on 01/24/2024) 30 capsule 1   guaifenesin  (ROBITUSSIN) 100 MG/5ML syrup Take 200 mg by mouth 3 (three) times daily as needed for cough. (Patient not taking: Reported on 01/24/2024)     guaiFENesin -codeine  100-10 MG/5ML syrup Take 5 mLs by mouth every 6 (six) hours as needed for cough. (Patient not taking: Reported on 01/24/2024) 118 mL 0   lansoprazole  (PREVACID ) 30 MG capsule Take 1 capsule (30 mg total) by mouth daily at 12 noon. (Patient not taking: Reported on 01/24/2024) 30 capsule 0   ondansetron  (ZOFRAN ) 8 MG tablet Take 1 tablet (8 mg total) by mouth every 8 (eight) hours as needed for nausea or vomiting. Start on the third day after chemotherapy. (Patient not taking: Reported on 01/24/2024) 30 tablet 1   prochlorperazine  (COMPAZINE ) 10 MG tablet Take 1 tablet (10 mg total) by mouth every 6 (six) hours as needed for nausea or vomiting. (Patient not taking: Reported on 01/24/2024) 30 tablet 1   sucralfate  (CARAFATE ) 1 g tablet Take 1 tablet (1 g total) by mouth 3 (three) times daily before meals. (Patient not taking: Reported on 01/24/2024) 90 tablet 1   No current facility-administered medications for this visit.   Facility-Administered Medications Ordered in Other Visits  Medication Dose Route Frequency Provider Last Rate Last Admin   0.9 %  sodium chloride  infusion    Intravenous Continuous Babara Call, MD 10 mL/hr at 01/24/24 0916 New Bag at 01/24/24 0916   durvalumab  (IMFINZI ) 740 mg in sodium chloride  0.9 % 100 mL chemo infusion  10 mg/kg (Treatment Plan Recorded) Intravenous Once Babara Call, MD        Review of Systems  Constitutional:  Negative for appetite change, chills, fatigue and fever.  HENT:   Negative for hearing loss and voice change.   Eyes:  Negative for eye problems.  Respiratory:  Negative for chest tightness, cough and hemoptysis.   Cardiovascular:  Negative for chest pain.  Gastrointestinal:  Negative for abdominal distention, abdominal pain and blood in stool.  Endocrine: Negative for hot flashes.  Genitourinary:  Negative for difficulty urinating and frequency.   Musculoskeletal:  Negative for arthralgias.       Right sciatic pain  Skin:  Negative for itching and rash.  Neurological:  Negative for extremity weakness.  Hematological:  Negative for adenopathy.  Psychiatric/Behavioral:  Negative for confusion.      PHYSICAL EXAMINATION: ECOG PERFORMANCE STATUS: 0 - Asymptomatic  Vitals:   01/24/24 0841  BP: 128/81  Pulse: 96  Resp: 18  Temp: (!) 97.2 F (36.2 C)  SpO2: 98%   Filed Weights   01/24/24 0841  Weight: 167 lb (75.8 kg)    Physical Exam Constitutional:      General: She is not in acute distress.    Appearance: She is not diaphoretic.  HENT:     Head: Normocephalic and atraumatic.  Eyes:     General: No scleral icterus. Cardiovascular:     Rate and Rhythm: Normal rate and regular rhythm.     Heart sounds: No murmur heard. Pulmonary:     Effort: Pulmonary effort is normal. No respiratory distress.  Breath sounds: No wheezing.  Abdominal:     General: There is no distension.     Palpations: Abdomen is soft.     Tenderness: There is no abdominal tenderness.  Musculoskeletal:        General: Normal range of motion.     Cervical back: Normal range of motion and neck supple.  Skin:    General: Skin  is warm and dry.     Findings: No erythema.  Neurological:     Mental Status: She is alert and oriented to person, place, and time. Mental status is at baseline.     Motor: No abnormal muscle tone.  Psychiatric:        Mood and Affect: Affect normal.      LABORATORY DATA:  I have reviewed the data as listed    Latest Ref Rng & Units 01/24/2024    8:29 AM 01/10/2024    8:49 AM 12/27/2023    8:43 AM  CBC  WBC 4.0 - 10.5 K/uL 5.1  5.7  5.1   Hemoglobin 12.0 - 15.0 g/dL 88.1  88.0  88.7   Hematocrit 36.0 - 46.0 % 35.0  34.6  32.9   Platelets 150 - 400 K/uL 263  296  267       Latest Ref Rng & Units 01/24/2024    8:29 AM 01/10/2024    8:49 AM 12/27/2023    8:43 AM  CMP  Glucose 70 - 99 mg/dL 99  896  891   BUN 8 - 23 mg/dL 13  9  11    Creatinine 0.44 - 1.00 mg/dL 9.34  9.26  9.23   Sodium 135 - 145 mmol/L 138  139  138   Potassium 3.5 - 5.1 mmol/L 3.6  3.9  3.4   Chloride 98 - 111 mmol/L 106  108  106   CO2 22 - 32 mmol/L 23  22  23    Calcium  8.9 - 10.3 mg/dL 9.3  9.3  9.2   Total Protein 6.5 - 8.1 g/dL 6.9  7.0  6.8   Total Bilirubin 0.0 - 1.2 mg/dL 0.6  0.6  0.6   Alkaline Phos 38 - 126 U/L 67  77  69   AST 15 - 41 U/L 52  31  23   ALT 0 - 44 U/L 68  23  22      RADIOGRAPHIC STUDIES: I have personally reviewed the radiological images as listed and agreed with the findings in the report. No results found.

## 2024-01-24 NOTE — Assessment & Plan Note (Signed)
 Immunotherapy plan as list above

## 2024-01-24 NOTE — Patient Instructions (Signed)
 [facility] - A DEPT OF [facility]  Discharge Instructions: Thank you for choosing [facility] to provide your oncology and hematology care.  If you have a lab appointment with the Cancer Center, please go directly to the Cancer Center and check in at the registration area.  Wear comfortable clothing and clothing appropriate for easy access to any Portacath or PICC line.   We strive to give you quality time with your provider. You may need to reschedule your appointment if you arrive late (15 or more minutes).  Arriving late affects you and other patients whose appointments are after yours.  Also, if you miss three or more appointments without notifying the office, you may be dismissed from the clinic at the provider's discretion.      For prescription refill requests, have your pharmacy contact our office and allow 72 hours for refills to be completed.    Today you received the following chemotherapy and/or immunotherapy agents Durvalumab  Infusion       To help prevent nausea and vomiting after your treatment, we encourage you to take your nausea medication as directed.  BELOW ARE SYMPTOMS THAT SHOULD BE REPORTED IMMEDIATELY: *FEVER GREATER THAN 100.4 F (38 C) OR HIGHER *CHILLS OR SWEATING *NAUSEA AND VOMITING THAT IS NOT CONTROLLED WITH YOUR NAUSEA MEDICATION *UNUSUAL SHORTNESS OF BREATH *UNUSUAL BRUISING OR BLEEDING *URINARY PROBLEMS (pain or burning when urinating, or frequent urination) *BOWEL PROBLEMS (unusual diarrhea, constipation, pain near the anus) TENDERNESS IN MOUTH AND THROAT WITH OR WITHOUT PRESENCE OF ULCERS (sore throat, sores in mouth, or a toothache) UNUSUAL RASH, SWELLING OR PAIN  UNUSUAL VAGINAL DISCHARGE OR ITCHING   Items with * indicate a potential emergency and should be followed up as soon as possible or go to the Emergency Department if any problems should occur.  Please show the CHEMOTHERAPY ALERT CARD or  IMMUNOTHERAPY ALERT CARD at check-in to the Emergency Department and triage nurse.  Should you have questions after your visit or need to cancel or reschedule your appointment, please contact [facility] - A DEPT OF [facility]  [phone] and follow the prompts.  Office hours are 8:00 a.m. to 4:30 p.m. Monday - Friday. Please note that voicemails left after 4:00 p.m. may not be returned until the following business day.  We are closed weekends and major holidays. You have access to a nurse at all times for urgent questions. Please call the main number to the clinic [phone] and follow the prompts.  For any non-urgent questions, you may also contact your provider using MyChart. We now offer e-Visits for anyone 73 and older to request care online for non-urgent symptoms. For details visit [email or web address].   Also download the MyChart app! Go to the app store, search MyChart, open the app, select [place], and log in with your MyChart username and password.

## 2024-01-24 NOTE — Assessment & Plan Note (Signed)
 Anticoagulation with Eliquis  5mg  BID till Dec 2025, then decrease to 2.5mg  BID .

## 2024-01-26 ENCOUNTER — Other Ambulatory Visit: Payer: Self-pay

## 2024-01-30 ENCOUNTER — Other Ambulatory Visit: Payer: Self-pay | Admitting: Family Medicine

## 2024-01-30 DIAGNOSIS — E785 Hyperlipidemia, unspecified: Secondary | ICD-10-CM

## 2024-01-31 NOTE — Telephone Encounter (Signed)
 Requested by interface surescripts . Last labs 06/18/23. Future visit 02/11/24. Requested Prescriptions  Pending Prescriptions Disp Refills   atorvastatin  (LIPITOR) 10 MG tablet [Pharmacy Med Name: Atorvastatin  Calcium  10 MG Oral Tablet] 90 tablet 0    Sig: TAKE 1 TABLET BY MOUTH AT BEDTIME     Cardiovascular:  Antilipid - Statins Failed - 01/31/2024  1:49 PM      Failed - Lipid Panel in normal range within the last 12 months    Cholesterol, Total  Date Value Ref Range Status  06/18/2023 167 100 - 199 mg/dL Final   LDL Cholesterol (Calc)  Date Value Ref Range Status  12/19/2020 166 (H) mg/dL (calc) Final    Comment:    Reference range: <100 . Desirable range <100 mg/dL for primary prevention;   <70 mg/dL for patients with CHD or diabetic patients  with > or = 2 CHD risk factors. SABRA LDL-C is now calculated using the Martin-Hopkins  calculation, which is a validated novel method providing  better accuracy than the Friedewald equation in the  estimation of LDL-C.  Gladis APPLETHWAITE et al. SANDREA. 7986;689(80): 2061-2068  (http://education.[employer].com/faq/FAQ164)    LDL Chol Calc (NIH)  Date Value Ref Range Status  06/18/2023 90 0 - 99 mg/dL Final   HDL  Date Value Ref Range Status  06/18/2023 48 >39 mg/dL Final   Triglycerides  Date Value Ref Range Status  06/18/2023 171 (H) 0 - 149 mg/dL Final         Passed - Patient is not pregnant      Passed - Valid encounter within last 12 months    Recent Outpatient Visits           7 months ago Pneumonia of right lower lobe due to infectious organism   [facility] Dickens, Megan P, DO   7 months ago Primary hypertension   [facility] Booker, [place], DO

## 2024-02-07 ENCOUNTER — Inpatient Hospital Stay

## 2024-02-07 ENCOUNTER — Encounter: Payer: Self-pay | Admitting: Oncology

## 2024-02-07 ENCOUNTER — Inpatient Hospital Stay (HOSPITAL_BASED_OUTPATIENT_CLINIC_OR_DEPARTMENT_OTHER): Admitting: Oncology

## 2024-02-07 VITALS — BP 134/76 | HR 101 | Temp 97.2°F | Resp 18 | Wt 164.6 lb

## 2024-02-07 VITALS — BP 118/76 | HR 100

## 2024-02-07 DIAGNOSIS — Z5112 Encounter for antineoplastic immunotherapy: Secondary | ICD-10-CM

## 2024-02-07 DIAGNOSIS — M25551 Pain in right hip: Secondary | ICD-10-CM | POA: Diagnosis not present

## 2024-02-07 DIAGNOSIS — C3491 Malignant neoplasm of unspecified part of right bronchus or lung: Secondary | ICD-10-CM

## 2024-02-07 DIAGNOSIS — I2693 Single subsegmental pulmonary embolism without acute cor pulmonale: Secondary | ICD-10-CM | POA: Diagnosis not present

## 2024-02-07 LAB — CMP (CANCER CENTER ONLY)
ALT: 29 U/L (ref 0–44)
AST: 22 U/L (ref 15–41)
Albumin: 4 g/dL (ref 3.5–5.0)
Alkaline Phosphatase: 72 U/L (ref 38–126)
Anion gap: 10 (ref 5–15)
BUN: 11 mg/dL (ref 8–23)
CO2: 24 mmol/L (ref 22–32)
Calcium: 9.2 mg/dL (ref 8.9–10.3)
Chloride: 104 mmol/L (ref 98–111)
Creatinine: 0.65 mg/dL (ref 0.44–1.00)
GFR, Estimated: >60 mL/min (ref >60)
Glucose, Bld: 108 mg/dL — ABNORMAL HIGH (ref 70–99)
Potassium: 3.5 mmol/L (ref 3.5–5.1)
Sodium: 138 mmol/L (ref 135–145)
Total Bilirubin: 0.6 mg/dL (ref 0.0–1.2)
Total Protein: 7 g/dL (ref 6.5–8.1)

## 2024-02-07 LAB — CBC WITH DIFFERENTIAL (CANCER CENTER ONLY)
Abs Immature Granulocytes: 0.01 K/uL (ref 0.00–0.07)
Basophils Absolute: 0.1 K/uL (ref 0.0–0.1)
Basophils Relative: 1 %
Eosinophils Absolute: 0.1 K/uL (ref 0.0–0.5)
Eosinophils Relative: 3 %
HCT: 36 % (ref 36.0–46.0)
Hemoglobin: 12 g/dL (ref 12.0–15.0)
Immature Granulocytes: 0 %
Lymphocytes Relative: 17 %
Lymphs Abs: 0.9 K/uL (ref 0.7–4.0)
MCH: 28.2 pg (ref 26.0–34.0)
MCHC: 33.3 g/dL (ref 30.0–36.0)
MCV: 84.7 fL (ref 80.0–100.0)
Monocytes Absolute: 0.6 K/uL (ref 0.1–1.0)
Monocytes Relative: 12 %
Neutro Abs: 3.6 K/uL (ref 1.7–7.7)
Neutrophils Relative %: 67 %
Platelet Count: 272 K/uL (ref 150–400)
RBC: 4.25 MIL/uL (ref 3.87–5.11)
RDW: 12.6 % (ref 11.5–15.5)
WBC Count: 5.3 K/uL (ref 4.0–10.5)
nRBC: 0 % (ref 0.0–0.2)

## 2024-02-07 LAB — TSH: TSH: 0.72 u[IU]/mL (ref 0.350–4.500)

## 2024-02-07 MED ORDER — SODIUM CHLORIDE 0.9 % IV SOLN
INTRAVENOUS | Status: DC
  Filled 2024-02-07: qty 250

## 2024-02-07 MED ORDER — SODIUM CHLORIDE 0.9 % IV SOLN
10.0000 mg/kg | Freq: Once | INTRAVENOUS | Status: AC
  Administered 2024-02-07: 740 mg via INTRAVENOUS
  Filled 2024-02-07: qty 4.8

## 2024-02-07 NOTE — Assessment & Plan Note (Signed)
 Images and pathology results were reviewed with the patient. cT2 N2 M0, Stage III right lung adenocarcinoma.  NGS showed TPS 5% CPS 10  KRAS G12C, TMB 12.6   Labs are reviewed and discussed with patient. Proceed with Durvalumab  Q 2weeks

## 2024-02-07 NOTE — Assessment & Plan Note (Signed)
 Immunotherapy plan as list above

## 2024-02-07 NOTE — Progress Notes (Signed)
 Hematology/Oncology Progress note [phone] [phone]       CHIEF COMPLAINTS/PURPOSE OF CONSULTATION:  Stage III lung adenocarcinoam  ASSESSMENT & PLAN:   Cancer Staging  Primary lung adenocarcinoma, right [facility]) Staging form: Lung, AJCC V9 - Clinical stage from 08/15/2023: Tanya Howell, cM0 - Signed by Babara Call, MD on 08/15/2023   Primary lung adenocarcinoma, right [facility]) Images and pathology results were reviewed with the patient. cT2 N2 M0, Stage III right lung adenocarcinoma.  NGS showed TPS 5% CPS 10  KRAS G12C, TMB 12.6   Labs are reviewed and discussed with patient. Proceed with Durvalumab  Q 2weeks   Encounter for antineoplastic immunotherapy Immunotherapy plan as list above  Pulmonary embolism (HCC) Anticoagulation with Eliquis  5mg  BID till Dec 2025, then decrease to 2.5mg  BID .    Right hip pain Possible sciatica pain vs hip arthritis- consider MRI right hip.  She plans to further discuss with PCP for evaluation.  Recommend tylenol  alternate with NSAIDs PRN      No orders of the defined types were placed in this encounter.  Follow-up 3 weeks  All questions were answered. The patient knows to call the clinic with any problems, questions or concerns.  Call Babara, MD, PhD [facility] Hematology Oncology 02/07/2024    HISTORY OF PRESENTING ILLNESS:  Tanya Howell 63 y.o. female presents to establish care for lung cancer  Oncology History  Primary lung adenocarcinoma, right (HCC)  08/03/2023 Imaging   CT angiogram chest PE protocol showed  1. 3.5 x 2.6 cm right hilar mass, with obstruction of the bronchus intermedius, right middle lobe bronchus, and portions of the right lower lobe bronchus as above, highly concerning for neoplasm. Bronchoscopy is recommended for further evaluation. 2. Subcarinal adenopathy concerning for metastatic disease. 3. Dense medial segment consolidation within the right middle lobe with associated volume  loss, consistent with atelectasis. 4. Prominent right lower lobe bronchiectasis, with fluid-filled bronchi and patchy right basilar consolidation consistent with combination of airspace disease and atelectasis. 5. No evidence of pulmonary embolus. 6. Aortic Atherosclerosis (ICD10-I70.0). Coronary artery atherosclerosis.   08/06/2023 Initial Diagnosis   Primary lung adenocarcinoma, right (HCC)  08/03/2023, patient presented to emergency room for evaluation of hemoptysis. Patient had pneumonia in March 2025, she continues to have a lingering cough for the past months despite being treated with doxycycline  and prednisone . She coughed up small amount of bright red blood on 08/03/2023.  Denies any shortness of breath, chest pain unintentional weight loss headache.  She felt feverish with chills 2 nights ago. Patient is a former smoker, quit smoking in 2011. Patient denies being on any antiplatelet or anticoagulation agents.  08/08/2023 She underwent biopsy via bronchoscopy.  FINAL MICROSCOPIC DIAGNOSIS:  A. LUNG, RLL, BRUSHING:  - Adenocarcinoma   B. LUNG, RLL, FINE NEEDLE ASPIRATION:  - Adenocarcinoma   Immunohistochemical stains were performed to characterize the tumor cells. The cells are negative for TTF-1, Napsin A, p40, synaptophysin, Chromogranin A, and CD56.  Mucicarmine stain highlights intracytoplasmic mucin. Ki67 proliferation index is approximately 30%. The overall findings are supportive of the diagnosis of adenocarcinoma.   C. LYMPH NODE, 7, FINE NEEDLE ASPIRATION:  - Adenocarcinoma  - Lymphoid tissue present   D. LYMPH NODE, 4R, FINE NEEDLE ASPIRATION:  - No malignant cells identified  - Lymphoid tissue present   E. LYMPH NODE, 11R, FINE NEEDLE ASPIRATION:  - No malignant cells identified  - Lymphoid tissue present   F. LUNG, RLL, LAVAGE:  FINAL MICROSCOPIC DIAGNOSIS:  -  Atypical cells present    08/15/2023 Cancer Staging   Staging form: Lung, AJCC V9 - Clinical  stage from 08/15/2023: Tanya Howell, cM0 - Signed by Babara Call, MD on 08/15/2023 Stage prefix: Initial diagnosis Method of lymph node assessment: Clinical   09/09/2023 - 10/21/2023 Chemotherapy   Patient is on Treatment Plan : LUNG Carboplatin  + Paclitaxel  + XRT q7d     11/22/2023 Imaging   CT chest with contrast   1. Diminished size subcarinal and right hilar node or mass, measuring 1.5 x 0.9 cm, previously 3.4 x 3.2 cm. Findings are consistent with treatment response. 2. No evidence of new or progressive disease in the chest. 3. Coronary artery disease   11/29/2023 -  Chemotherapy   Patient is on Treatment Plan : LUNG Durvalumab  (10) q14d      09/23/2023 diagnosis of Segmental pulmonary emboli in the lingula. She takes Eliquis  5mg  BID  She tolerates immunotherapy.   Right side sciatic pain- she was taking Tylenol  PRN and stopped due to elevated LFT.   MEDICAL HISTORY:  Past Medical History:  Diagnosis Date   COVID-19 07/2018   Hyperlipidemia    Hypertension    Lung cancer (HCC) 07/2023   Motion sickness    amuesment park rides   Osteopenia 11/01/2015   Pneumonia    april 2025   Psoriasis    Vertigo    none for over 5 yrs   Wears dentures    full upper and lower    SURGICAL HISTORY: Past Surgical History:  Procedure Laterality Date   BRONCHIAL BIOPSY  08/08/2023   Procedure: BRONCHOSCOPY, WITH BIOPSY;  Surgeon: Malka Domino, MD;  Location: [facility] ENDOSCOPY;  Service: Pulmonary;;   BRONCHIAL BRUSHINGS  08/08/2023   Procedure: BRONCHOSCOPY, WITH BRUSH BIOPSY;  Surgeon: Malka Domino, MD;  Location: [facility] ENDOSCOPY;  Service: Pulmonary;;   BRONCHIAL NEEDLE ASPIRATION BIOPSY  08/08/2023   Procedure: BRONCHOSCOPY, WITH NEEDLE ASPIRATION BIOPSY;  Surgeon: Malka Domino, MD;  Location: [facility] ENDOSCOPY;  Service: Pulmonary;;   CHOLECYSTECTOMY  1990   COLONOSCOPY WITH PROPOFOL  N/A 07/15/2020   Procedure: COLONOSCOPY WITH PROPOFOL ;  Surgeon: Jinny Carmine, MD;  Location[facility];  Service: Endoscopy;  Laterality: N/A;  priority 4   ENDOBRONCHIAL ULTRASOUND Bilateral 08/08/2023   Procedure: ENDOBRONCHIAL ULTRASOUND (EBUS);  Surgeon: Malka Domino, MD;  Location[facility] ENDOSCOPY;  Service: Pulmonary;  Laterality: Bilateral;   IR IMAGING GUIDED PORT INSERTION  08/29/2023   TUBAL LIGATION      SOCIAL HISTORY: Social History   Socioeconomic History   Marital status: Married    Spouse name: Oneil   Number of children: 1   Years of education: Not on file   Highest education level: High school graduate  Occupational History   Not on file  Tobacco Use   Smoking status: Former    Current packs/day: 0.00    Average packs/day: 1 pack/day for 28.0 years (28.0 ttl pk-yrs)    Types: Cigarettes    Start date: 31    Quit date: 2011    Years since quitting: 14.8   Smokeless tobacco: Never   Tobacco comments:    Started smoking around 63 yrs old.    Smoked 1PPD at her heaviest.    Quit smoking in 2011- leda 08/07/2023  Vaping Use   Vaping status: Former  Substance and Sexual Activity   Alcohol use: No    Alcohol/week: 0.0 standard drinks of alcohol    Comment: rare   Drug use: No  Sexual activity: Yes    Birth control/protection: Surgical  Other Topics Concern   Not on file  Social History Narrative   Not on file   Social Drivers of Health   Financial Resource Strain: Low Risk  (05/06/2018)   Overall Financial Resource Strain (CARDIA)    Difficulty of Paying Living Expenses: Not hard at all  Food Insecurity: No Food Insecurity (09/24/2023)   Hunger Vital Sign    Worried About Running Out of Food in the Last Year: Never true    Ran Out of Food in the Last Year: Never true  Transportation Needs: No Transportation Needs (09/24/2023)   PRAPARE - Administrator, Civil Service (Medical): No    Lack of Transportation (Non-Medical): No  Physical Activity: Sufficiently Active (05/06/2018)   Exercise Vital Sign    Days of Exercise per  Week: 7 days    Minutes of Exercise per Session: 30 min  Stress: No Stress Concern Present (05/06/2018)   [employer] of Occupational Health - Occupational Stress Questionnaire    Feeling of Stress : Not at all  Social Connections: Moderately Isolated (09/24/2023)   Social Connection and Isolation Panel    Frequency of Communication with Friends and Family: More than three times a week    Frequency of Social Gatherings with Friends and Family: Twice a week    Attends Religious Services: Never    Database Administrator or Organizations: No    Attends Banker Meetings: Never    Marital Status: Married  Catering Manager Violence: Not At Risk (09/24/2023)   Humiliation, Afraid, Rape, and Kick questionnaire    Fear of Current or Ex-Partner: No    Emotionally Abused: No    Physically Abused: No    Sexually Abused: No    FAMILY HISTORY: Family History  Problem Relation Age of Onset   Colon cancer Brother        47    Breast cancer Mother    Breast cancer Maternal Aunt    Pneumonia Father    Diabetes Maternal Grandmother     ALLERGIES:  is allergic to lisinopril .  MEDICATIONS:  Current Outpatient Medications  Medication Sig Dispense Refill   apixaban  (ELIQUIS ) 5 MG TABS tablet Take 1 tablet (5 mg total) by mouth 2 (two) times daily. 60 tablet 4   atorvastatin  (LIPITOR) 10 MG tablet TAKE 1 TABLET BY MOUTH AT BEDTIME 90 tablet 0   levalbuterol  (XOPENEX  HFA) 45 MCG/ACT inhaler Inhale 2 puffs into the lungs every 6 (six) hours as needed for wheezing. 15 g 3   lidocaine -prilocaine  (EMLA ) cream Apply to affected area once 30 g 3   losartan  (COZAAR ) 25 MG tablet Take 1/2 (one-half) tablet by mouth once daily 45 tablet 0   meclizine  (ANTIVERT ) 25 MG tablet Take 1 tablet (25 mg total) by mouth 3 (three) times daily as needed for dizziness. 30 tablet 3   triamcinolone  ointment (KENALOG ) 0.5 % Apply 1 Application topically 2 (two) times daily. 30 g 6   benzonatate  (TESSALON   PERLES) 100 MG capsule Take 1 capsule (100 mg total) by mouth 2 (two) times daily as needed for cough. (Patient not taking: Reported on 02/07/2024) 30 capsule 1   guaifenesin  (ROBITUSSIN) 100 MG/5ML syrup Take 200 mg by mouth 3 (three) times daily as needed for cough. (Patient not taking: Reported on 02/07/2024)     guaiFENesin -codeine  100-10 MG/5ML syrup Take 5 mLs by mouth every 6 (six) hours as needed for cough. (Patient  not taking: Reported on 02/07/2024) 118 mL 0   lansoprazole  (PREVACID ) 30 MG capsule Take 1 capsule (30 mg total) by mouth daily at 12 noon. (Patient not taking: Reported on 02/07/2024) 30 capsule 0   ondansetron  (ZOFRAN ) 8 MG tablet Take 1 tablet (8 mg total) by mouth every 8 (eight) hours as needed for nausea or vomiting. Start on the third day after chemotherapy. (Patient not taking: Reported on 02/07/2024) 30 tablet 1   prochlorperazine  (COMPAZINE ) 10 MG tablet Take 1 tablet (10 mg total) by mouth every 6 (six) hours as needed for nausea or vomiting. (Patient not taking: Reported on 02/07/2024) 30 tablet 1   sucralfate  (CARAFATE ) 1 g tablet Take 1 tablet (1 g total) by mouth 3 (three) times daily before meals. (Patient not taking: Reported on 02/07/2024) 90 tablet 1   No current facility-administered medications for this visit.    Review of Systems  Constitutional:  Negative for appetite change, chills, fatigue and fever.  HENT:   Negative for hearing loss and voice change.   Eyes:  Negative for eye problems.  Respiratory:  Negative for chest tightness, cough and hemoptysis.   Cardiovascular:  Negative for chest pain.  Gastrointestinal:  Negative for abdominal distention, abdominal pain and blood in stool.  Endocrine: Negative for hot flashes.  Genitourinary:  Negative for difficulty urinating and frequency.   Musculoskeletal:  Negative for arthralgias.       Right sciatic pain  Skin:  Negative for itching and rash.  Neurological:  Negative for extremity weakness.   Hematological:  Negative for adenopathy.  Psychiatric/Behavioral:  Negative for confusion.      PHYSICAL EXAMINATION: ECOG PERFORMANCE STATUS: 0 - Asymptomatic  Vitals:   02/07/24 0850  BP: 134/76  Pulse: (!) 101  Resp: 18  Temp: (!) 97.2 F (36.2 C)  SpO2: 98%   Filed Weights   02/07/24 0850  Weight: 164 lb 9.6 oz (74.7 kg)    Physical Exam Constitutional:      General: She is not in acute distress.    Appearance: She is not diaphoretic.  HENT:     Head: Normocephalic and atraumatic.  Eyes:     General: No scleral icterus. Cardiovascular:     Rate and Rhythm: Normal rate and regular rhythm.     Heart sounds: No murmur heard. Pulmonary:     Effort: Pulmonary effort is normal. No respiratory distress.     Breath sounds: No wheezing.  Abdominal:     General: There is no distension.     Palpations: Abdomen is soft.     Tenderness: There is no abdominal tenderness.  Musculoskeletal:        General: Normal range of motion.     Cervical back: Normal range of motion and neck supple.  Skin:    General: Skin is warm and dry.     Findings: No erythema.  Neurological:     Mental Status: She is alert and oriented to person, place, and time. Mental status is at baseline.     Motor: No abnormal muscle tone.  Psychiatric:        Mood and Affect: Affect normal.      LABORATORY DATA:  I have reviewed the data as listed    Latest Ref Rng & Units 02/07/2024    8:41 AM 01/24/2024    8:29 AM 01/10/2024    8:49 AM  CBC  WBC 4.0 - 10.5 K/uL 5.3  5.1  5.7   Hemoglobin 12.0 - 15.0  g/dL 87.9  88.1  88.0   Hematocrit 36.0 - 46.0 % 36.0  35.0  34.6   Platelets 150 - 400 K/uL 272  263  296       Latest Ref Rng & Units 01/24/2024    8:29 AM 01/10/2024    8:49 AM 12/27/2023    8:43 AM  CMP  Glucose 70 - 99 mg/dL 99  896  891   BUN 8 - 23 mg/dL 13  9  11    Creatinine 0.44 - 1.00 mg/dL 9.34  9.26  9.23   Sodium 135 - 145 mmol/L 138  139  138   Potassium 3.5 - 5.1 mmol/L 3.6   3.9  3.4   Chloride 98 - 111 mmol/L 106  108  106   CO2 22 - 32 mmol/L 23  22  23    Calcium  8.9 - 10.3 mg/dL 9.3  9.3  9.2   Total Protein 6.5 - 8.1 g/dL 6.9  7.0  6.8   Total Bilirubin 0.0 - 1.2 mg/dL 0.6  0.6  0.6   Alkaline Phos 38 - 126 U/L 67  77  69   AST 15 - 41 U/L 52  31  23   ALT 0 - 44 U/L 68  23  22      RADIOGRAPHIC STUDIES: I have personally reviewed the radiological images as listed and agreed with the findings in the report. No results found.

## 2024-02-07 NOTE — Assessment & Plan Note (Signed)
 Anticoagulation with Eliquis  5mg  BID till Dec 2025, then decrease to 2.5mg  BID .

## 2024-02-07 NOTE — Assessment & Plan Note (Signed)
 Possible sciatica pain vs hip arthritis- consider MRI right hip.  She plans to further discuss with PCP for evaluation.  Recommend tylenol  alternate with NSAIDs PRN

## 2024-02-08 LAB — T4: T4, Total: 7.9 ug/dL (ref 4.5–12.0)

## 2024-02-11 ENCOUNTER — Encounter: Payer: Self-pay | Admitting: Family Medicine

## 2024-02-11 ENCOUNTER — Ambulatory Visit: Admitting: Family Medicine

## 2024-02-11 VITALS — BP 120/80 | HR 98 | Temp 98.0°F | Ht 64.0 in | Wt 164.8 lb

## 2024-02-11 DIAGNOSIS — I10 Essential (primary) hypertension: Secondary | ICD-10-CM | POA: Diagnosis not present

## 2024-02-11 DIAGNOSIS — C3491 Malignant neoplasm of unspecified part of right bronchus or lung: Secondary | ICD-10-CM | POA: Diagnosis not present

## 2024-02-11 DIAGNOSIS — E785 Hyperlipidemia, unspecified: Secondary | ICD-10-CM | POA: Diagnosis not present

## 2024-02-11 DIAGNOSIS — M5431 Sciatica, right side: Secondary | ICD-10-CM | POA: Diagnosis not present

## 2024-02-11 DIAGNOSIS — Z1231 Encounter for screening mammogram for malignant neoplasm of breast: Secondary | ICD-10-CM

## 2024-02-11 DIAGNOSIS — Z0000 Encounter for general adult medical examination without abnormal findings: Secondary | ICD-10-CM | POA: Diagnosis not present

## 2024-02-11 LAB — MICROALBUMIN, URINE WAIVED
Creatinine, Urine Waived: 200 mg/dL (ref 10–300)
Microalb, Ur Waived: 30 mg/L — ABNORMAL HIGH (ref 0–19)
Microalb/Creat Ratio: <30 mg/g (ref <30)

## 2024-02-11 MED ORDER — LOSARTAN POTASSIUM 25 MG PO TABS: 25.0000 mg | ORAL_TABLET | Freq: Every day | ORAL | 0 refills | Status: DC

## 2024-02-11 MED ORDER — CYCLOBENZAPRINE HCL 10 MG PO TABS: 10.0000 mg | ORAL_TABLET | Freq: Every day | ORAL | 0 refills | Status: AC

## 2024-02-11 MED ORDER — ATORVASTATIN CALCIUM 10 MG PO TABS: 10.0000 mg | ORAL_TABLET | Freq: Every day | ORAL | 0 refills | Status: AC

## 2024-02-11 NOTE — Assessment & Plan Note (Signed)
 Under good control on current regimen. Continue current regimen. Continue to monitor. Call with any concerns. Refills given. Labs drawn today.

## 2024-02-11 NOTE — Progress Notes (Signed)
 BP 120/80   Pulse 98   Temp 98 F (36.7 C) (Oral)   Ht 5' 4 (1.626 m)   Wt 164 lb 12.8 oz (74.8 kg)   SpO2 97%   BMI 28.29 kg/m    Subjective:    Patient ID: Tanya Howell, female    DOB: 01-Sep-1960, 63 y.o.   MRN: [id number]  HPI: Tanya Howell is a 63 y.o. female presenting on 02/11/2024 for comprehensive medical examination. Current medical complaints include:  HYPERTENSION / HYPERLIPIDEMIA Satisfied with current treatment? yes Duration of hypertension: chronic BP monitoring frequency: several times a month BP medication side effects: no Past BP meds: losartan  Duration of hyperlipidemia: chronic Cholesterol medication side effects: no Cholesterol supplements: none Past cholesterol medications: atorvastatin  Medication compliance: excellent compliance Aspirin: no Recent stressors: no Recurrent headaches: no Visual changes: no Palpitations: no Dyspnea: no Chest pain: no Lower extremity edema: no Dizzy/lightheaded: no  BACK PAIN Duration: 6-8 weeks Mechanism of injury: no trauma Location: R buttock into her leg Onset: sudden Severity: 3/10 Quality: dull, occasionally sharp Frequency: constant Radiation: buttocks Aggravating factors: sitting Alleviating factors: tylenol  Status: stable Treatments attempted: APAP  Relief with NSAIDs?: No NSAIDs Taken Nighttime pain:  yes Paresthesias / decreased sensation:  no Bowel / bladder incontinence:  no Fevers:  no Dysuria / urinary frequency:  no  Menopausal Symptoms: no  Depression Screen done today and results listed below:     02/11/2024    9:10 AM 12/27/2023    9:40 AM 12/13/2023   11:29 AM 11/29/2023    9:33 AM 11/28/2023    9:32 AM  Depression screen PHQ 2/9  Decreased Interest 1 0 0 0 0  Down, Depressed, Hopeless 1 0 0 0 0  PHQ - 2 Score 2 0 0 0 0  Altered sleeping 1      Tired, decreased energy 2      Change in appetite 2      Feeling bad or failure about yourself  0      Trouble  concentrating 1      Moving slowly or fidgety/restless 0      Suicidal thoughts 0      PHQ-9 Score 8         Past Medical History:  Past Medical History:  Diagnosis Date   COVID-19 07/2018   Hyperlipidemia    Hypertension    Lung cancer (HCC) 07/2023   Motion sickness    amuesment park rides   Osteopenia 11/01/2015   Pneumonia    april 2025   Psoriasis    Vertigo    none for over 5 yrs   Wears dentures    full upper and lower    Surgical History:  Past Surgical History:  Procedure Laterality Date   BRONCHIAL BIOPSY  08/08/2023   Procedure: BRONCHOSCOPY, WITH BIOPSY;  Surgeon: Malka Domino, MD;  Location: [facility];  Service: Pulmonary;;   BRONCHIAL BRUSHINGS  08/08/2023   Procedure: BRONCHOSCOPY, WITH BRUSH BIOPSY;  Surgeon: Malka Domino, MD;  Location: [facility];  Service: Pulmonary;;   BRONCHIAL NEEDLE ASPIRATION BIOPSY  08/08/2023   Procedure: BRONCHOSCOPY, WITH NEEDLE ASPIRATION BIOPSY;  Surgeon: Malka Domino, MD;  Location: [facility];  Service: Pulmonary;;   CHOLECYSTECTOMY  1990   COLONOSCOPY WITH PROPOFOL  N/A 07/15/2020   Procedure: COLONOSCOPY WITH PROPOFOL ;  Surgeon: Jinny Carmine, MD;  Location: [facility];  Service: Endoscopy;  Laterality: N/A;  priority 4   ENDOBRONCHIAL ULTRASOUND Bilateral 08/08/2023   Procedure: ENDOBRONCHIAL ULTRASOUND (  EBUS);  Surgeon: Malka Domino, MD;  Location: [facility];  Service: Pulmonary;  Laterality: Bilateral;   IR IMAGING GUIDED PORT INSERTION  08/29/2023   TUBAL LIGATION      Medications:  Current Outpatient Medications on File Prior to Visit  Medication Sig   apixaban  (ELIQUIS ) 5 MG TABS tablet Take 1 tablet (5 mg total) by mouth 2 (two) times daily.   levalbuterol  (XOPENEX  HFA) 45 MCG/ACT inhaler Inhale 2 puffs into the lungs every 6 (six) hours as needed for wheezing.   lidocaine -prilocaine  (EMLA ) cream Apply to affected area once   meclizine  (ANTIVERT ) 25 MG tablet Take 1 tablet  (25 mg total) by mouth 3 (three) times daily as needed for dizziness.   triamcinolone  ointment (KENALOG ) 0.5 % Apply 1 Application topically 2 (two) times daily.   No current facility-administered medications on file prior to visit.    Allergies:  Allergies  Allergen Reactions   Lisinopril  Cough    Social History:  Social History   Socioeconomic History   Marital status: Married    Spouse name: Oneil   Number of children: 1   Years of education: Not on file   Highest education level: High school graduate  Occupational History   Not on file  Tobacco Use   Smoking status: Former    Current packs/day: 0.00    Average packs/day: 1 pack/day for 28.0 years (28.0 ttl pk-yrs)    Types: Cigarettes    Start date: 43    Quit date: 2011    Years since quitting: 14.8   Smokeless tobacco: Never   Tobacco comments:    Started smoking around 63 yrs old.    Smoked 1PPD at her heaviest.    Quit smoking in 2011- [facility]a 08/07/2023  Vaping Use   Vaping status: Former  Substance and Sexual Activity   Alcohol use: No    Alcohol/week: 0.0 standard drinks of alcohol    Comment: rare   Drug use: No   Sexual activity: Yes    Birth control/protection: Surgical  Other Topics Concern   Not on file  Social History Narrative   Not on file   Social Drivers of Health   Financial Resource Strain: Low Risk  (05/06/2018)   Overall Financial Resource Strain (CARDIA)    Difficulty of Paying Living Expenses: Not hard at all  Food Insecurity: No Food Insecurity (09/24/2023)   Hunger Vital Sign    Worried About Running Out of Food in the Last Year: Never true    Ran Out of Food in the Last Year: Never true  Transportation Needs: No Transportation Needs (09/24/2023)   PRAPARE - Administrator, Civil Service (Medical): No    Lack of Transportation (Non-Medical): No  Physical Activity: Insufficiently Active (02/11/2024)   Exercise Vital Sign    Days of Exercise per Week: 4 days    Minutes of  Exercise per Session: 30 min  Stress: No Stress Concern Present (02/11/2024)   [employer] of Occupational Health - Occupational Stress Questionnaire    Feeling of Stress: Only a little  Social Connections: Moderately Isolated (09/24/2023)   Social Connection and Isolation Panel    Frequency of Communication with Friends and Family: More than three times a week    Frequency of Social Gatherings with Friends and Family: Twice a week    Attends Religious Services: Never    Database Administrator or Organizations: No    Attends Banker Meetings: Never  Marital Status: Married  Catering Manager Violence: Not At Risk (09/24/2023)   Humiliation, Afraid, Rape, and Kick questionnaire    Fear of Current or Ex-Partner: No    Emotionally Abused: No    Physically Abused: No    Sexually Abused: No   Social History   Tobacco Use  Smoking Status Former   Current packs/day: 0.00   Average packs/day: 1 pack/day for 28.0 years (28.0 ttl pk-yrs)   Types: Cigarettes   Start date: 32   Quit date: 2011   Years since quitting: 14.8  Smokeless Tobacco Never  Tobacco Comments   Started smoking around 63 yrs old.   Smoked 1PPD at her heaviest.   Quit smoking in 2011- leda 08/07/2023   Social History   Substance and Sexual Activity  Alcohol Use No   Alcohol/week: 0.0 standard drinks of alcohol   Comment: rare    Family History:  Family History  Problem Relation Age of Onset   Colon cancer Brother        32    Breast cancer Mother    Breast cancer Maternal Aunt    Pneumonia Father    Diabetes Maternal Grandmother     Past medical history, surgical history, medications, allergies, family history and social history reviewed with patient today and changes made to appropriate areas of the chart.   Review of Systems  Constitutional: Negative.   HENT: Negative.    Eyes: Negative.   Respiratory:  Positive for cough. Negative for hemoptysis, sputum production, shortness of  breath and wheezing.   Cardiovascular:  Positive for palpitations. Negative for chest pain, orthopnea, claudication, leg swelling and PND.  Gastrointestinal:  Positive for constipation. Negative for abdominal pain, blood in stool, diarrhea, heartburn, melena, nausea and vomiting.  Genitourinary: Negative.   Musculoskeletal: Negative.   Skin: Negative.   Neurological: Negative.   Endo/Heme/Allergies:  Negative for environmental allergies and polydipsia. Bruises/bleeds easily.  Psychiatric/Behavioral: Negative.     All other ROS negative except what is listed above and in the HPI.      Objective:    BP 120/80   Pulse 98   Temp 98 F (36.7 C) (Oral)   Ht 5' 4 (1.626 m)   Wt 164 lb 12.8 oz (74.8 kg)   SpO2 97%   BMI 28.29 kg/m   Wt Readings from Last 3 Encounters:  02/11/24 164 lb 12.8 oz (74.8 kg)  02/07/24 164 lb 9.6 oz (74.7 kg)  01/24/24 167 lb (75.8 kg)    Physical Exam Vitals and nursing note reviewed.  Constitutional:      General: She is not in acute distress.    Appearance: Normal appearance. She is not ill-appearing, toxic-appearing or diaphoretic.  HENT:     Head: Normocephalic and atraumatic.     Right Ear: Tympanic membrane, ear canal and external ear normal. There is no impacted cerumen.     Left Ear: Tympanic membrane, ear canal and external ear normal. There is no impacted cerumen.     Nose: Nose normal. No congestion or rhinorrhea.     Mouth/Throat:     Mouth: Mucous membranes are moist.     Pharynx: Oropharynx is clear. No oropharyngeal exudate or posterior oropharyngeal erythema.  Eyes:     General: No scleral icterus.       Right eye: No discharge.        Left eye: No discharge.     Extraocular Movements: Extraocular movements intact.     Conjunctiva/sclera: Conjunctivae normal.  Pupils: Pupils are equal, round, and reactive to light.  Neck:     Vascular: No carotid bruit.  Cardiovascular:     Rate and Rhythm: Normal rate and regular rhythm.      Pulses: Normal pulses.     Heart sounds: Murmur heard.     No friction rub. No gallop.  Pulmonary:     Effort: Pulmonary effort is normal. No respiratory distress.     Breath sounds: Normal breath sounds. No stridor. No wheezing, rhonchi or rales.  Chest:     Chest wall: No tenderness.  Abdominal:     General: Abdomen is flat. Bowel sounds are normal. There is no distension.     Palpations: Abdomen is soft. There is no mass.     Tenderness: There is no abdominal tenderness. There is no right CVA tenderness, left CVA tenderness, guarding or rebound.     Hernia: No hernia is present.  Genitourinary:    Comments: Breast and pelvic exams deferred with shared decision making Musculoskeletal:        General: No swelling, tenderness, deformity or signs of injury.     Cervical back: Normal range of motion and neck supple. No rigidity. No muscular tenderness.     Right lower leg: No edema.     Left lower leg: No edema.  Lymphadenopathy:     Cervical: No cervical adenopathy.  Skin:    General: Skin is warm and dry.     Capillary Refill: Capillary refill takes less than 2 seconds.     Coloration: Skin is not jaundiced or pale.     Findings: No bruising, erythema, lesion or rash.  Neurological:     General: No focal deficit present.     Mental Status: She is alert and oriented to person, place, and time. Mental status is at baseline.     Cranial Nerves: No cranial nerve deficit.     Sensory: No sensory deficit.     Motor: No weakness.     Coordination: Coordination normal.     Gait: Gait normal.     Deep Tendon Reflexes: Reflexes normal.  Psychiatric:        Mood and Affect: Mood normal.        Behavior: Behavior normal.        Thought Content: Thought content normal.        Judgment: Judgment normal.     Results for orders placed or performed in visit on 02/07/24  TSH   Collection Time: 02/07/24  8:41 AM  Result Value Ref Range   TSH 0.720 0.350 - 4.500 uIU/mL  T4   Collection  Time: 02/07/24  8:41 AM  Result Value Ref Range   T4, Total 7.9 4.5 - 12.0 ug/dL  CMP (Cancer Center only)   Collection Time: 02/07/24  8:41 AM  Result Value Ref Range   Sodium 138 135 - 145 mmol/L   Potassium 3.5 3.5 - 5.1 mmol/L   Chloride 104 98 - 111 mmol/L   CO2 24 22 - 32 mmol/L   Glucose, Bld 108 (H) 70 - 99 mg/dL   BUN 11 8 - 23 mg/dL   Creatinine 9.34 9.55 - 1.00 mg/dL   Calcium  9.2 8.9 - 10.3 mg/dL   Total Protein 7.0 6.5 - 8.1 g/dL   Albumin 4.0 3.5 - 5.0 g/dL   AST 22 15 - 41 U/L   ALT 29 0 - 44 U/L   Alkaline Phosphatase 72 38 - 126 U/L   Total  Bilirubin 0.6 0.0 - 1.2 mg/dL   GFR, Estimated >39 >39 mL/min   Anion gap 10 5 - 15  CBC with Differential (Cancer Center Only)   Collection Time: 02/07/24  8:41 AM  Result Value Ref Range   WBC Count 5.3 4.0 - 10.5 K/uL   RBC 4.25 3.87 - 5.11 MIL/uL   Hemoglobin 12.0 12.0 - 15.0 g/dL   HCT 63.9 63.9 - 53.9 %   MCV 84.7 80.0 - 100.0 fL   MCH 28.2 26.0 - 34.0 pg   MCHC 33.3 30.0 - 36.0 g/dL   RDW 87.3 88.4 - 84.4 %   Platelet Count 272 150 - 400 K/uL   nRBC 0.0 0.0 - 0.2 %   Neutrophils Relative % 67 %   Neutro Abs 3.6 1.7 - 7.7 K/uL   Lymphocytes Relative 17 %   Lymphs Abs 0.9 0.7 - 4.0 K/uL   Monocytes Relative 12 %   Monocytes Absolute 0.6 0.1 - 1.0 K/uL   Eosinophils Relative 3 %   Eosinophils Absolute 0.1 0.0 - 0.5 K/uL   Basophils Relative 1 %   Basophils Absolute 0.1 0.0 - 0.1 K/uL   Immature Granulocytes 0 %   Abs Immature Granulocytes 0.01 0.00 - 0.07 K/uL      Assessment & Plan:   Problem List Items Addressed This Visit       Cardiovascular and Mediastinum   HTN (hypertension)   Under good control on current regimen. Continue current regimen. Continue to monitor. Call with any concerns. Refills given. Labs drawn today.       Relevant Medications   atorvastatin  (LIPITOR) 10 MG tablet   losartan  (COZAAR ) 25 MG tablet   Other Relevant Orders   Microalbumin, Urine Waived     Respiratory    Primary lung adenocarcinoma, right (HCC) (Chronic)   Continue to follow with oncology. Call with any concerns.         Other   HLD (hyperlipidemia)   Under good control on current regimen. Continue current regimen. Continue to monitor. Call with any concerns. Refills given. Labs drawn today.        Relevant Medications   atorvastatin  (LIPITOR) 10 MG tablet   losartan  (COZAAR ) 25 MG tablet   Other Relevant Orders   Lipid Panel w/o Chol/HDL Ratio   Other Visit Diagnoses       Routine general medical examination at a health care facility    -  Primary   Vaccines up to date/declined. Screening labs checked today. Mammo ordered. Colonoscopy and pap up to date. Continue diet and exercise. Call with any concerns.   Relevant Orders   Lipid Panel w/o Chol/HDL Ratio     Right sided sciatica       Will treat with flexeril and stretches. Call if not getting better or getting worse. Continue to monitor.   Relevant Medications   cyclobenzaprine (FLEXERIL) 10 MG tablet     Encounter for screening mammogram for malignant neoplasm of breast       Mammogram ordered today.   Relevant Orders   MM 3D SCREENING MAMMOGRAM BILATERAL BREAST        Follow up plan: Return in about 6 months (around 08/10/2024).   LABORATORY TESTING:  - Pap smear: up to date  IMMUNIZATIONS:   - Tdap: Tetanus vaccination status reviewed: last tetanus booster within 10 years. - Influenza: Up to date - Prevnar: Refused - COVID: Refused - HPV: Not applicable - Shingrix vaccine: Refused  SCREENING: -Mammogram: Ordered  today  - Colonoscopy: Up to date   PATIENT COUNSELING:   Advised to take 1 mg of folate supplement per day if capable of pregnancy.   Sexuality: Discussed sexually transmitted diseases, partner selection, use of condoms, avoidance of unintended pregnancy  and contraceptive alternatives.   Advised to avoid cigarette smoking.  I discussed with the patient that most people either abstain from  alcohol or drink within safe limits (<=14/week and <=4 drinks/occasion for males, <=7/weeks and <= 3 drinks/occasion for females) and that the risk for alcohol disorders and other health effects rises proportionally with the number of drinks per week and how often a drinker exceeds daily limits.  Discussed cessation/primary prevention of drug use and availability of treatment for abuse.   Diet: Encouraged to adjust caloric intake to maintain  or achieve ideal body weight, to reduce intake of dietary saturated fat and total fat, to limit sodium intake by avoiding high sodium foods and not adding table salt, and to maintain adequate dietary potassium and calcium  preferably from fresh fruits, vegetables, and low-fat dairy products.    stressed the importance of regular exercise  Injury prevention: Discussed safety belts, safety helmets, smoke detector, smoking near bedding or upholstery.   Dental health: Discussed importance of regular tooth brushing, flossing, and dental visits.    NEXT PREVENTATIVE PHYSICAL DUE IN 1 YEAR. Return in about 6 months (around 08/10/2024).

## 2024-02-11 NOTE — Assessment & Plan Note (Signed)
 Continue to follow with oncology. Call with any concerns.

## 2024-02-12 ENCOUNTER — Other Ambulatory Visit: Payer: Self-pay

## 2024-02-12 LAB — LIPID PANEL W/O CHOL/HDL RATIO
Cholesterol, Total: 141 mg/dL (ref 100–199)
HDL: 49 mg/dL (ref >39)
LDL Chol Calc (NIH): 72 mg/dL (ref 0–99)
Triglycerides: 113 mg/dL (ref 0–149)
VLDL Cholesterol Cal: 20 mg/dL (ref 5–40)

## 2024-02-13 ENCOUNTER — Ambulatory Visit: Payer: Self-pay | Admitting: Family Medicine

## 2024-02-13 ENCOUNTER — Ambulatory Visit
Admission: RE | Admit: 2024-02-13 | Discharge: 2024-02-13 | Disposition: A | Discharge status: Home / Self Care | Source: Ambulatory Visit | Attending: Radiation Oncology | Admitting: Radiation Oncology

## 2024-02-13 DIAGNOSIS — C3491 Malignant neoplasm of unspecified part of right bronchus or lung: Secondary | ICD-10-CM | POA: Insufficient documentation

## 2024-02-13 MED ORDER — IOHEXOL 300 MG/ML  SOLN
80.0000 mL | Freq: Once | INTRAMUSCULAR | Status: AC | PRN
  Administered 2024-02-13: 80 mL via INTRAVENOUS

## 2024-02-21 ENCOUNTER — Ambulatory Visit: Payer: Self-pay | Admitting: Oncology

## 2024-02-21 ENCOUNTER — Inpatient Hospital Stay

## 2024-02-21 ENCOUNTER — Inpatient Hospital Stay (HOSPITAL_BASED_OUTPATIENT_CLINIC_OR_DEPARTMENT_OTHER): Admitting: Oncology

## 2024-02-21 ENCOUNTER — Encounter: Payer: Self-pay | Admitting: Oncology

## 2024-02-21 ENCOUNTER — Inpatient Hospital Stay: Attending: Oncology

## 2024-02-21 VITALS — BP 130/82 | HR 103 | Temp 97.1°F | Resp 18 | Ht 64.0 in | Wt 161.5 lb

## 2024-02-21 VITALS — BP 119/72 | HR 98 | Temp 97.8°F | Resp 19

## 2024-02-21 DIAGNOSIS — I2693 Single subsegmental pulmonary embolism without acute cor pulmonale: Secondary | ICD-10-CM | POA: Diagnosis not present

## 2024-02-21 DIAGNOSIS — Z803 Family history of malignant neoplasm of breast: Secondary | ICD-10-CM | POA: Insufficient documentation

## 2024-02-21 DIAGNOSIS — C3491 Malignant neoplasm of unspecified part of right bronchus or lung: Secondary | ICD-10-CM | POA: Diagnosis not present

## 2024-02-21 DIAGNOSIS — Z87891 Personal history of nicotine dependence: Secondary | ICD-10-CM | POA: Insufficient documentation

## 2024-02-21 DIAGNOSIS — Z5112 Encounter for antineoplastic immunotherapy: Secondary | ICD-10-CM | POA: Insufficient documentation

## 2024-02-21 DIAGNOSIS — R300 Dysuria: Secondary | ICD-10-CM

## 2024-02-21 DIAGNOSIS — Z7901 Long term (current) use of anticoagulants: Secondary | ICD-10-CM | POA: Diagnosis not present

## 2024-02-21 DIAGNOSIS — M25551 Pain in right hip: Secondary | ICD-10-CM

## 2024-02-21 DIAGNOSIS — Z800 Family history of malignant neoplasm of digestive organs: Secondary | ICD-10-CM | POA: Insufficient documentation

## 2024-02-21 DIAGNOSIS — I2699 Other pulmonary embolism without acute cor pulmonale: Secondary | ICD-10-CM | POA: Insufficient documentation

## 2024-02-21 DIAGNOSIS — M8580 Other specified disorders of bone density and structure, unspecified site: Secondary | ICD-10-CM | POA: Insufficient documentation

## 2024-02-21 LAB — CMP (CANCER CENTER ONLY)
ALT: 26 U/L (ref 0–44)
AST: 22 U/L (ref 15–41)
Albumin: 4 g/dL (ref 3.5–5.0)
Alkaline Phosphatase: 75 U/L (ref 38–126)
Anion gap: 9 (ref 5–15)
BUN: 14 mg/dL (ref 8–23)
CO2: 24 mmol/L (ref 22–32)
Calcium: 9.4 mg/dL (ref 8.9–10.3)
Chloride: 105 mmol/L (ref 98–111)
Creatinine: 0.78 mg/dL (ref 0.44–1.00)
GFR, Estimated: >60 mL/min (ref >60)
Glucose, Bld: 96 mg/dL (ref 70–99)
Potassium: 3.6 mmol/L (ref 3.5–5.1)
Sodium: 138 mmol/L (ref 135–145)
Total Bilirubin: 0.4 mg/dL (ref 0.0–1.2)
Total Protein: 7 g/dL (ref 6.5–8.1)

## 2024-02-21 LAB — CBC WITH DIFFERENTIAL (CANCER CENTER ONLY)
Abs Immature Granulocytes: 0.02 K/uL (ref 0.00–0.07)
Basophils Absolute: 0.1 K/uL (ref 0.0–0.1)
Basophils Relative: 1 %
Eosinophils Absolute: 0.2 K/uL (ref 0.0–0.5)
Eosinophils Relative: 3 %
HCT: 36.9 % (ref 36.0–46.0)
Hemoglobin: 12.2 g/dL (ref 12.0–15.0)
Immature Granulocytes: 0 %
Lymphocytes Relative: 13 %
Lymphs Abs: 0.8 K/uL (ref 0.7–4.0)
MCH: 27.5 pg (ref 26.0–34.0)
MCHC: 33.1 g/dL (ref 30.0–36.0)
MCV: 83.3 fL (ref 80.0–100.0)
Monocytes Absolute: 0.6 K/uL (ref 0.1–1.0)
Monocytes Relative: 10 %
Neutro Abs: 4.4 K/uL (ref 1.7–7.7)
Neutrophils Relative %: 73 %
Platelet Count: 243 K/uL (ref 150–400)
RBC: 4.43 MIL/uL (ref 3.87–5.11)
RDW: 12.7 % (ref 11.5–15.5)
WBC Count: 6.1 K/uL (ref 4.0–10.5)
nRBC: 0 % (ref 0.0–0.2)

## 2024-02-21 LAB — URINALYSIS, COMPLETE (UACMP) WITH MICROSCOPIC
Bilirubin Urine: NEGATIVE
Glucose, UA: NEGATIVE mg/dL
Ketones, ur: NEGATIVE mg/dL
Nitrite: NEGATIVE
Protein, ur: 30 mg/dL — AB
RBC / HPF: >50 RBC/hpf (ref 0–5)
Specific Gravity, Urine: 1.021 (ref 1.005–1.030)
WBC, UA: >50 WBC/hpf (ref 0–5)
pH: 5 (ref 5.0–8.0)

## 2024-02-21 MED ORDER — NITROFURANTOIN MONOHYD MACRO 100 MG PO CAPS: 100.0000 mg | ORAL_CAPSULE | Freq: Two times a day (BID) | ORAL | 0 refills | Status: DC

## 2024-02-21 MED ORDER — SODIUM CHLORIDE 0.9 % IV SOLN
10.0000 mg/kg | Freq: Once | INTRAVENOUS | Status: AC
  Administered 2024-02-21: 740 mg via INTRAVENOUS
  Filled 2024-02-21: qty 4.8

## 2024-02-21 MED ORDER — SODIUM CHLORIDE 0.9 % IV SOLN
INTRAVENOUS | Status: DC
  Filled 2024-02-21: qty 250

## 2024-02-21 NOTE — Assessment & Plan Note (Signed)
 Possible sciatica pain vs hip arthritis- consider MRI right hip  patient has discussed with PCP and is trying flexeril, if no improvement will obtain image studies Recommend tylenol  alternate with NSAIDs PRN

## 2024-02-21 NOTE — Assessment & Plan Note (Addendum)
 Images and pathology results were reviewed with the patient. cT2 N2 M0, Stage III right lung adenocarcinoma.  NGS showed TPS 5% CPS 10  KRAS G12C, TMB 12.6   Labs are reviewed and discussed with patient. Proceed with Durvalumab  Q 2weeks  02/13/24 CT scan showed  soft tissue thickening in the right infrahilar region and medial right lower lobe. New extensive airspace opacification in the right perihilar region and right paramediastinal lung in the right middle lobe and right lower lobe. new airspace opacity in the right lower lobe, new spiculated nodule in the right lower lobe Will review and discuss her case on tumor board.

## 2024-02-21 NOTE — Assessment & Plan Note (Signed)
 UA showed + leukocytes Urine culture is pending.  Recommend Nitrofurantoin 100mg  BID x 5 days. Rx was sent to pharmacy

## 2024-02-21 NOTE — Progress Notes (Signed)
 Hematology/Oncology Progress note [phone] [phone]) [phone]       CHIEF COMPLAINTS/PURPOSE OF CONSULTATION:  Stage III lung adenocarcinoam  ASSESSMENT & PLAN:   Cancer Staging  Primary lung adenocarcinoma, right [facility]) Staging form: Lung, AJCC V9 - Clinical stage from 08/15/2023: Tanya Howell, cM0 - Signed by Babara Call, MD on 08/15/2023   Primary lung adenocarcinoma, right [facility]) Images and pathology results were reviewed with the patient. cT2 N2 M0, Stage III right lung adenocarcinoma.  NGS showed TPS 5% CPS 10  KRAS G12C, TMB 12.6   Labs are reviewed and discussed with patient. Proceed with Durvalumab  Q 2weeks  02/13/24 CT scan showed  soft tissue thickening in the right infrahilar region and medial right lower lobe. New extensive airspace opacification in the right perihilar region and right paramediastinal lung in the right middle lobe and right lower lobe. new airspace opacity in the right lower lobe, new spiculated nodule in the right lower lobe Will review and discuss her case on tumor board.   Encounter for antineoplastic immunotherapy Immunotherapy plan as list above  Pulmonary embolism (HCC) Anticoagulation with Eliquis  5mg  BID till Dec 2025, then decrease to 2.5mg  BID .    Right hip pain Possible sciatica pain vs hip arthritis- consider MRI right hip  patient has discussed with PCP and is trying flexeril, if no improvement will obtain image studies Recommend tylenol  alternate with NSAIDs PRN  Dysuria UA showed + leukocytes Urine culture is pending.  Recommend Nitrofurantoin 100mg  BID x 5 days. Rx was sent to pharmacy      Orders Placed This Encounter  Procedures   Urine Culture    Standing Status:   Future    Number of Occurrences:   1    Expected Date:   02/21/2024    Expiration Date:   05/21/2024   CBC with Differential (Cancer Center Only)    Standing Status:   Future    Expected Date:   03/23/2024    Expiration Date:   03/23/2025   CMP  (Cancer Center only)    Standing Status:   Future    Expected Date:   03/23/2024    Expiration Date:   03/23/2025   T4    Standing Status:   Future    Expected Date:   03/23/2024    Expiration Date:   03/23/2025   TSH    Standing Status:   Future    Expected Date:   03/23/2024    Expiration Date:   03/23/2025   CBC with Differential (Cancer Center Only)    Standing Status:   Future    Expected Date:   04/06/2024    Expiration Date:   04/06/2025   CMP (Cancer Center only)    Standing Status:   Future    Expected Date:   04/06/2024    Expiration Date:   04/06/2025   Urinalysis, Complete w Microscopic    Standing Status:   Future    Number of Occurrences:   1    Expected Date:   02/21/2024    Expiration Date:   05/21/2024   Follow-up 3 weeks  All questions were answered. The patient knows to call the clinic with any problems, questions or concerns.  Call Babara, MD, PhD [facility] Hematology Oncology 02/21/2024    HISTORY OF PRESENTING ILLNESS:  Tanya Howell 63 y.o. female presents to establish care for lung cancer  Oncology History  Primary lung adenocarcinoma, right [facility])  08/03/2023 Imaging   CT angiogram  chest PE protocol showed  1. 3.5 x 2.6 cm right hilar mass, with obstruction of the bronchus intermedius, right middle lobe bronchus, and portions of the right lower lobe bronchus as above, highly concerning for neoplasm. Bronchoscopy is recommended for further evaluation. 2. Subcarinal adenopathy concerning for metastatic disease. 3. Dense medial segment consolidation within the right middle lobe with associated volume loss, consistent with atelectasis. 4. Prominent right lower lobe bronchiectasis, with fluid-filled bronchi and patchy right basilar consolidation consistent with combination of airspace disease and atelectasis. 5. No evidence of pulmonary embolus. 6. Aortic Atherosclerosis (ICD10-I70.0). Coronary artery atherosclerosis.   08/06/2023 Initial  Diagnosis   Primary lung adenocarcinoma, right (HCC)  08/03/2023, patient presented to emergency room for evaluation of hemoptysis. Patient had pneumonia in March 2025, she continues to have a lingering cough for the past months despite being treated with doxycycline  and prednisone . She coughed up small amount of bright red blood on 08/03/2023.  Denies any shortness of breath, chest pain unintentional weight loss headache.  She felt feverish with chills 2 nights ago. Patient is a former smoker, quit smoking in 2011. Patient denies being on any antiplatelet or anticoagulation agents.  08/08/2023 She underwent biopsy via bronchoscopy.  FINAL MICROSCOPIC DIAGNOSIS:  A. LUNG, RLL, BRUSHING:  - Adenocarcinoma   B. LUNG, RLL, FINE NEEDLE ASPIRATION:  - Adenocarcinoma   Immunohistochemical stains were performed to characterize the tumor cells. The cells are negative for TTF-1, Napsin A, p40, synaptophysin, Chromogranin A, and CD56.  Mucicarmine stain highlights intracytoplasmic mucin. Ki67 proliferation index is approximately 30%. The overall findings are supportive of the diagnosis of adenocarcinoma.   C. LYMPH NODE, 7, FINE NEEDLE ASPIRATION:  - Adenocarcinoma  - Lymphoid tissue present   D. LYMPH NODE, 4R, FINE NEEDLE ASPIRATION:  - No malignant cells identified  - Lymphoid tissue present   E. LYMPH NODE, 11R, FINE NEEDLE ASPIRATION:  - No malignant cells identified  - Lymphoid tissue present   F. LUNG, RLL, LAVAGE:  FINAL MICROSCOPIC DIAGNOSIS:  - Atypical cells present    08/15/2023 Cancer Staging   Staging form: Lung, AJCC V9 - Clinical stage from 08/15/2023: Tanya Howell, cM0 - Signed by Babara Call, MD on 08/15/2023 Stage prefix: Initial diagnosis Method of lymph node assessment: Clinical   09/09/2023 - 10/21/2023 Chemotherapy   Patient is on Treatment Plan : LUNG Carboplatin  + Paclitaxel  + XRT q7d     11/22/2023 Imaging   CT chest with contrast   1. Diminished size subcarinal and  right hilar node or mass, measuring 1.5 x 0.9 cm, previously 3.4 x 3.2 cm. Findings are consistent with treatment response. 2. No evidence of new or progressive disease in the chest. 3. Coronary artery disease   11/29/2023 -  Chemotherapy   Patient is on Treatment Plan : LUNG Durvalumab  (10) q14d     02/17/2024 Imaging   CT chest w contrast  1. Significant interval change in appearance of the right lung as detailed above. This certainly could reflect radiation pneumonitis if the patient has received any new RT. Pneumonia or aspiration would be another consideration. I believe it is unlikely that this is recurrent tumor given the fact that the last CT showed a very good response to treatment and given its rapid onset and extensive appearance. However, that does remain a possibility. Recommend correlation with clinical findings of pneumonia and appropriate treatment. A short-term follow-up chest CT in 2-3 months may be helpful to reassess this process. 2. 12 mm spiculated appearing nodule in  the right lower lobe laterally could be part of the same process. 3. No findings for her metastatic disease involving the upper abdomen or bony structures.    09/23/2023 diagnosis of Segmental pulmonary emboli in the lingula. She takes Eliquis  5mg  BID She denies any cough, SOB chest pain, diarrhea. She tolerates immunotherapy.  Right side sciatic pain- she was taking Tylenol  PRN and stopped due to elevated LFT.   MEDICAL HISTORY:  Past Medical History:  Diagnosis Date   COVID-19 07/2018   Hyperlipidemia    Hypertension    Lung cancer (HCC) 07/2023   Motion sickness    amuesment park rides   Osteopenia 11/01/2015   Pneumonia    april 2025   Psoriasis    Vertigo    none for over 5 yrs   Wears dentures    full upper and lower    SURGICAL HISTORY: Past Surgical History:  Procedure Laterality Date   BRONCHIAL BIOPSY  08/08/2023   Procedure: BRONCHOSCOPY, WITH BIOPSY;  Surgeon: Malka Domino, MD;  Location: [facility];  Service: Pulmonary;;   BRONCHIAL BRUSHINGS  08/08/2023   Procedure: BRONCHOSCOPY, WITH BRUSH BIOPSY;  Surgeon: Malka Domino, MD;  Location: [facility];  Service: Pulmonary;;   BRONCHIAL NEEDLE ASPIRATION BIOPSY  08/08/2023   Procedure: BRONCHOSCOPY, WITH NEEDLE ASPIRATION BIOPSY;  Surgeon: Malka Domino, MD;  Location: [facility];  Service: Pulmonary;;   CHOLECYSTECTOMY  1990   COLONOSCOPY WITH PROPOFOL  N/A 07/15/2020   Procedure: COLONOSCOPY WITH PROPOFOL ;  Surgeon: Jinny Carmine, MD;  Location: [facility];  Service: Endoscopy;  Laterality: N/A;  priority 4   ENDOBRONCHIAL ULTRASOUND Bilateral 08/08/2023   Procedure: ENDOBRONCHIAL ULTRASOUND (EBUS);  Surgeon: Malka Domino, MD;  Location: [facility] END[facility]COPY;  Service: Pulmonary;  Laterality: Bilateral;   IR IMAGING GUIDED PORT INSERTION  08/29/2023   TUBAL LIGATION      SOCIAL HISTORY: Social History   Socioeconomic History   Marital status: Married    Spouse name: Oneil   Number of children: 1   Years of education: Not on file   Highest education level: High school graduate  Occupational History   Not on file  Tobacco Use   Smoking status: Former    Current packs/day: 0.00    Average packs/day: 1 pack/day for 28.0 years (28.0 ttl pk-yrs)    Types: Cigarettes    Start date: 79    Quit date: 2011    Years since quitting: 14.8   Smokeless tobacco: Never   Tobacco comments:    Started smoking around 63 yrs old.    Smoked 1PPD at her heaviest.    Quit smoking in 2011- khj 08/07/2023  Vaping Use   Vaping status: Former  Substance and Sexual Activity   Alcohol use: No    Alcohol/week: 0.0 standard drinks of alcohol    Comment: rare   Drug use: No   Sexual activity: Yes    Birth control/protection: Surgical  Other Topics Concern   Not on file  Social History Narrative   Not on file   Social Drivers of Health   Financial Resource Strain: Low Risk  (05/06/2018)    Overall Financial Resource Strain (CARDIA)    Difficulty of Paying Living Expenses: Not hard at all  Food Insecurity: No Food Insecurity (09/24/2023)   Hunger Vital Sign    Worried About Running Out of Food in the Last Year: Never true    Ran Out of Food in the Last Year: Never true  Transportation Needs: No Transportation  Needs (09/24/2023)   PRAPARE - Administrator, Civil Service (Medical): No    Lack of Transportation (Non-Medical): No  Physical Activity: Insufficiently Active (02/11/2024)   Exercise Vital Sign    Days of Exercise per Week: 4 days    Minutes of Exercise per Session: 30 min  Stress: No Stress Concern Present (02/11/2024)   [employer] of Occupational Health - Occupational Stress Questionnaire    Feeling of Stress: Only a little  Social Connections: Moderately Isolated (09/24/2023)   Social Connection and Isolation Panel    Frequency of Communication with Friends and Family: More than three times a week    Frequency of Social Gatherings with Friends and Family: Twice a week    Attends Religious Services: Never    Database Administrator or Organizations: No    Attends Banker Meetings: Never    Marital Status: Married  Catering Manager Violence: Not At Risk (09/24/2023)   Humiliation, Afraid, Rape, and Kick questionnaire    Fear of Current or Ex-Partner: No    Emotionally Abused: No    Physically Abused: No    Sexually Abused: No    FAMILY HISTORY: Family History  Problem Relation Age of Onset   Colon cancer Brother        68    Breast cancer Mother    Breast cancer Maternal Aunt    Pneumonia Father    Diabetes Maternal Grandmother     ALLERGIES:  is allergic to lisinopril .  MEDICATIONS:  Current Outpatient Medications  Medication Sig Dispense Refill   apixaban  (ELIQUIS ) 5 MG TABS tablet Take 1 tablet (5 mg total) by mouth 2 (two) times daily. 60 tablet 4   atorvastatin  (LIPITOR) 10 MG tablet Take 1 tablet (10 mg total) by  mouth at bedtime. 90 tablet 0   cyclobenzaprine (FLEXERIL) 10 MG tablet Take 1 tablet (10 mg total) by mouth at bedtime. 30 tablet 0   levalbuterol  (XOPENEX  HFA) 45 MCG/ACT inhaler Inhale 2 puffs into the lungs every 6 (six) hours as needed for wheezing. 15 g 3   lidocaine -prilocaine  (EMLA ) cream Apply to affected area once 30 g 3   losartan  (COZAAR ) 25 MG tablet Take 1 tablet (25 mg total) by mouth daily. 45 tablet 0   meclizine  (ANTIVERT ) 25 MG tablet Take 1 tablet (25 mg total) by mouth 3 (three) times daily as needed for dizziness. 30 tablet 3   nitrofurantoin, macrocrystal-monohydrate, (MACROBID) 100 MG capsule Take 1 capsule (100 mg total) by mouth 2 (two) times daily. 10 capsule 0   triamcinolone  ointment (KENALOG ) 0.5 % Apply 1 Application topically 2 (two) times daily. 30 g 6   No current facility-administered medications for this visit.   Facility-Administered Medications Ordered in Other Visits  Medication Dose Route Frequency Provider Last Rate Last Admin   0.9 %  sodium chloride  infusion   Intravenous Continuous Babara Call, MD   Stopped at 02/21/24 1139    Review of Systems  Constitutional:  Negative for appetite change, chills, fatigue and fever.  HENT:   Negative for hearing loss and voice change.   Eyes:  Negative for eye problems.  Respiratory:  Negative for chest tightness, cough and hemoptysis.   Cardiovascular:  Negative for chest pain.  Gastrointestinal:  Negative for abdominal distention, abdominal pain and blood in stool.  Endocrine: Negative for hot flashes.  Genitourinary:  Negative for difficulty urinating and frequency.   Musculoskeletal:  Negative for arthralgias.  Right sciatic pain  Skin:  Negative for itching and rash.  Neurological:  Negative for extremity weakness.  Hematological:  Negative for adenopathy.  Psychiatric/Behavioral:  Negative for confusion.      PHYSICAL EXAMINATION: ECOG PERFORMANCE STATUS: 0 - Asymptomatic  Vitals:   02/21/24  0918  BP: 130/82  Pulse: (!) 103  Resp: 18  Temp: (!) 97.1 F (36.2 C)  SpO2: 99%   Filed Weights   02/21/24 0918  Weight: 161 lb 8 oz (73.3 kg)    Physical Exam Constitutional:      General: She is not in acute distress.    Appearance: She is not diaphoretic.  HENT:     Head: Normocephalic and atraumatic.  Eyes:     General: No scleral icterus. Cardiovascular:     Rate and Rhythm: Normal rate and regular rhythm.     Heart sounds: No murmur heard. Pulmonary:     Effort: Pulmonary effort is normal. No respiratory distress.     Breath sounds: No wheezing.  Abdominal:     General: There is no distension.     Palpations: Abdomen is soft.     Tenderness: There is no abdominal tenderness.  Musculoskeletal:        General: Normal range of motion.     Cervical back: Normal range of motion and neck supple.  Skin:    General: Skin is warm and dry.     Findings: No erythema.  Neurological:     Mental Status: She is alert and oriented to person, place, and time. Mental status is at baseline.     Motor: No abnormal muscle tone.  Psychiatric:        Mood and Affect: Affect normal.      LABORATORY DATA:  I have reviewed the data as listed    Latest Ref Rng & Units 02/21/2024    8:51 AM 02/07/2024    8:41 AM 01/24/2024    8:29 AM  CBC  WBC 4.0 - 10.5 K/uL 6.1  5.3  5.1   Hemoglobin 12.0 - 15.0 g/dL 87.7  87.9  88.1   Hematocrit 36.0 - 46.0 % 36.9  36.0  35.0   Platelets 150 - 400 K/uL 243  272  263       Latest Ref Rng & Units 02/21/2024    8:51 AM 02/07/2024    8:41 AM 01/24/2024    8:29 AM  CMP  Glucose 70 - 99 mg/dL 96  891  99   BUN 8 - 23 mg/dL 14  11  13    Creatinine 0.44 - 1.00 mg/dL 9.21  9.34  9.34   Sodium 135 - 145 mmol/L 138  138  138   Potassium 3.5 - 5.1 mmol/L 3.6  3.5  3.6   Chloride 98 - 111 mmol/L 105  104  106   CO2 22 - 32 mmol/L 24  24  23    Calcium  8.9 - 10.3 mg/dL 9.4  9.2  9.3   Total Protein 6.5 - 8.1 g/dL 7.0  7.0  6.9   Total  Bilirubin 0.0 - 1.2 mg/dL 0.4  0.6  0.6   Alkaline Phos 38 - 126 U/L 75  72  67   AST 15 - 41 U/L 22  22  52   ALT 0 - 44 U/L 26  29  68      RADIOGRAPHIC STUDIES: I have personally reviewed the radiological images as listed and agreed with the findings in the report. CT  Chest W Contrast Result Date: 02/17/2024 EXAM: CT CHEST WITH CONTRAST 02/13/2024 09:04:10 AM TECHNIQUE: CT of the chest was performed with the administration of intravenous contrast. Multiplanar reformatted images are provided for review. Automated exposure control, iterative reconstruction, and/or weight based adjustment of the mA/kV was utilized to reduce the radiation dose to as low as reasonably achievable. CONTRAST: 80 mL of Omnipaque  300. COMPARISON: CT Chest 11/22/2023. CLINICAL HISTORY: Non-small cell lung cancer (NSCLC), non-metastatic, assess treatment response. Stage III lung adenocarcinoma Right. FINDINGS: MEDIASTINUM: The heart is normal in size. No pericardial effusion. The aorta is normal in caliber with stable atherosclerotic calcification. Stable scattered 3-vessel coronary artery calcifications. The port-a-cath is in good position without complicating features. The pulmonary arteries are grossly normal. The central airways are clear. The esophagus is grossly normal. LYMPH NODES: Right infrahilar node measures 10 mm (image 74/2), increased from 9 mm on the prior CT of 11/22/2023. No other mediastinal, hilar, supraclavicular, or axillary lymphadenopathy. LUNGS AND PLEURA: New soft tissue thickening in the right infrahilar region and medial right lower lobe. New extensive airspace opacification in the right perihilar region and right paramediastinal lung in the right middle lobe and right lower lobe. I don't see that the patient has had any recent radiation but it certainly has the appearance of such. Other possibilities would include pneumonia, aspiration, or recurrent tumor. There is also new airspace opacity in the right  lower lobe in the absence of esophageal recess measuring approximately 2.3 x 2.0 cm on image 58/4. There is also a new spiculated nodule in the right lower lobe on image number 84/4. The left lung remains clear. No pleural effusion, pleural lesions, or pneumothorax. SOFT TISSUES/BONES: No breast masses. The bony thorax is intact. No acute soft tissue abnormality. UPPER ABDOMEN: Limited images of the upper abdomen demonstrate no significant findings. Stable vascular calcifications. No hepatic or adrenal gland lesions. IMPRESSION: 1. Significant interval change in appearance of the right lung as detailed above. This certainly could reflect radiation pneumonitis if the patient has received any new RT. Pneumonia or aspiration would be another consideration. I believe it is unlikely that this is recurrent tumor given the fact that the last CT showed a very good response to treatment and given its rapid onset and extensive appearance. However, that does remain a possibility. Recommend correlation with clinical findings of pneumonia and appropriate treatment. A short-term follow-up chest CT in 2-3 months may be helpful to reassess this process. 2. 12 mm spiculated appearing nodule in the right lower lobe laterally could be part of the same process. 3. New findings for her metastatic disease involving the upper abdomen or bony structures. Electronically signed by: Maude Stammer MD 02/17/2024 11:31 AM EST RP Workstation: [id number]

## 2024-02-21 NOTE — Assessment & Plan Note (Signed)
 Immunotherapy plan as list above

## 2024-02-21 NOTE — Assessment & Plan Note (Signed)
 Anticoagulation with Eliquis  5mg  BID till Dec 2025, then decrease to 2.5mg  BID .

## 2024-02-21 NOTE — Progress Notes (Signed)
 Patient is having UTI symptoms and she is having some pain that runs down from her right buttocks down to her leg.

## 2024-02-24 ENCOUNTER — Other Ambulatory Visit: Payer: Self-pay | Admitting: Oncology

## 2024-02-24 ENCOUNTER — Ambulatory Visit: Payer: Self-pay | Admitting: Oncology

## 2024-02-24 ENCOUNTER — Encounter: Payer: Self-pay | Admitting: Family Medicine

## 2024-02-24 DIAGNOSIS — M5431 Sciatica, right side: Secondary | ICD-10-CM

## 2024-02-24 LAB — URINE CULTURE: Culture: >=100000 — AB

## 2024-02-24 MED ORDER — CIPROFLOXACIN HCL 250 MG PO TABS: 250.0000 mg | ORAL_TABLET | Freq: Two times a day (BID) | ORAL | 0 refills | Status: DC

## 2024-02-24 NOTE — Telephone Encounter (Signed)
-----   Message from Zelphia Cap sent at 02/24/2024  8:55 AM EST ----- Please let patient know that her urine culture grew bacteria that is not very sensitive to the antibiotics I prescribed her. Recommend her to stop Macrobid and switch to Cipro. I will send her a Rx.  Thanks.  ----- Message ----- From: Rebecka, Lab [facility] [place] Sent: 02/21/2024  11:06 AM EST To: Zelphia Cap, MD

## 2024-02-24 NOTE — Telephone Encounter (Signed)
 Called and left message with patient  that her urine culture grew bacteria that is not very sensitive to the antibiotics that Dr. Babara prescribed her. I informed her that Dr. Babara recommends her to stop Macrobid and switch to Cipro and that Dr. Babara will send  Rx.

## 2024-02-25 ENCOUNTER — Encounter: Payer: Self-pay | Admitting: Oncology

## 2024-02-25 NOTE — Telephone Encounter (Signed)
 Copied from CRM [id number]. Topic: Clinical - Request for Lab/Test Order >> Feb 25, 2024  1:40 PM Sophia H wrote: Reason for CRM: Patient was in a few weeks ago and saw PCP for sciatica issues, states meds/exercises have not helped. Wanting to get an Xray scheduled. Please advise # [phone]

## 2024-02-26 ENCOUNTER — Encounter: Payer: Self-pay | Admitting: Emergency Medicine

## 2024-02-26 ENCOUNTER — Ambulatory Visit (INDEPENDENT_AMBULATORY_CARE_PROVIDER_SITE_OTHER)

## 2024-02-26 ENCOUNTER — Ambulatory Visit
Admission: EM | Admit: 2024-02-26 | Discharge: 2024-02-26 | Disposition: A | Discharge status: Home / Self Care | Attending: Emergency Medicine | Admitting: Emergency Medicine

## 2024-02-26 DIAGNOSIS — M25551 Pain in right hip: Secondary | ICD-10-CM

## 2024-02-26 MED ORDER — OXYCODONE HCL 5 MG PO TABS: 5.0000 mg | ORAL_TABLET | Freq: Four times a day (QID) | ORAL | 0 refills | Status: AC | PRN

## 2024-02-26 NOTE — ED Provider Notes (Signed)
 [facility]    CSN: [id number] Arrival date & time: 02/26/24  9041      History   Chief Complaint Chief Complaint  Patient presents with   Hip Pain    HPI Tanya Howell is a 63 y.o. female.   63 year old female, Tanya Howell, presents to urgent care for evaluation of right hip pain x 2 months.  Patient denies any injury states she was seen at her PCP 2 weeks ago and was prescribed Flexeril but has not helping.  Patient has also tried over-the-counter meds such as lidocaine  patch, Salonpas, Biofreeze, heat and ice without relief.  Patient has a past medical history of lung cancer, hyperlipidemia, hypertension  Patient reports she cannot take a lot of Tylenol  as she had bump in liver enzymes in past, also was on Eliquis  and cannot take  NSAIDs.  The history is provided by the patient. No language interpreter was used.    Past Medical History:  Diagnosis Date   COVID-19 07/2018   Hyperlipidemia    Hypertension    Lung cancer (HCC) 07/2023   Motion sickness    amuesment park rides   Osteopenia 11/01/2015   Pneumonia    april 2025   Psoriasis    Vertigo    none for over 5 yrs   Wears dentures    full upper and lower    Patient Active Problem List   Diagnosis Date Noted   Dysuria 02/21/2024   Right hip pain 02/07/2024   Encounter for antineoplastic immunotherapy 11/29/2023   Hypokalemia 09/30/2023   Pulmonary embolism (HCC) 09/23/2023   Hypocalcemia 09/16/2023   Encounter for antineoplastic chemotherapy 09/06/2023   Cough 08/07/2023   Primary lung adenocarcinoma, right (HCC) 08/06/2023   Hemoptysis 08/06/2023   HTN (hypertension) 11/13/2022   HLD (hyperlipidemia) 12/19/2020   Psoriasis 12/03/2015   Vertigo 12/03/2015   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   Osteopenia 11/01/2015    Past Surgical History:  Procedure Laterality Date   BRONCHIAL BIOPSY  08/08/2023   Procedure: BRONCHOSCOPY, WITH BIOPSY;  Surgeon: Malka Domino, MD;   Location: [facility];  Service: Pulmonary;;   BRONCHIAL BRUSHINGS  08/08/2023   Procedure: BRONCHOSCOPY, WITH BRUSH BIOPSY;  Surgeon: Malka Domino, MD;  Location: [facility];  Service: Pulmonary;;   BRONCHIAL NEEDLE ASPIRATION BIOPSY  08/08/2023   Procedure: BRONCHOSCOPY, WITH NEEDLE ASPIRATION BIOPSY;  Surgeon: Malka Domino, MD;  Location: [facility];  Service: Pulmonary;;   CHOLECYSTECTOMY  1990   COLONOSCOPY WITH PROPOFOL  N/A 07/15/2020   Procedure: COLONOSCOPY WITH PROPOFOL ;  Surgeon: Jinny Carmine, MD;  Location: [facility];  Service: Endoscopy;  Laterality: N/A;  priority 4   ENDOBRONCHIAL ULTRASOUND Bilateral 08/08/2023   Procedure: ENDOBRONCHIAL ULTRASOUND (EBUS);  Surgeon: Malka Domino, MD;  Location: [facility];  Service: Pulmonary;  Laterality: Bilateral;   IR IMAGING GUIDED PORT INSERTION  08/29/2023   TUBAL LIGATION      OB History     Gravida  1   Para  1   Term  1   Preterm      AB      Living  1      SAB      IAB      Ectopic      Multiple      Live Births  1            Home Medications    Prior to Admission medications   Medication Sig Start Date End Date Taking? Authorizing Provider  ciprofloxacin (CIPRO) 250 MG tablet Take 1 tablet (250 mg total) by mouth 2 (two) times daily. 02/24/24   Babara Call, MD  oxyCODONE  (ROXICODONE ) 5 MG immediate release tablet Take 1 tablet (5 mg total) by mouth every 6 (six) hours as needed for up to 3 days for severe pain (pain score 7-10). 02/26/24 02/29/24 Yes Brynlea Spindler, Rilla, NP  apixaban  (ELIQUIS ) 5 MG TABS tablet Take 1 tablet (5 mg total) by mouth 2 (two) times daily. 10/14/23   Babara Call, MD  atorvastatin  (LIPITOR) 10 MG tablet Take 1 tablet (10 mg total) by mouth at bedtime. 02/11/24   Johnson, Megan P, DO  cyclobenzaprine (FLEXERIL) 10 MG tablet Take 1 tablet (10 mg total) by mouth at bedtime. 02/11/24   Johnson, Megan P, DO  levalbuterol  (XOPENEX  HFA) 45 MCG/ACT inhaler Inhale  2 puffs into the lungs every 6 (six) hours as needed for wheezing. 08/07/23   Malka Domino, MD  lidocaine -prilocaine  (EMLA ) cream Apply to affected area once 08/27/23   Yu, Zhou, MD  losartan  (COZAAR ) 25 MG tablet Take 1 tablet (25 mg total) by mouth daily. 02/11/24   Johnson, Megan P, DO  meclizine  (ANTIVERT ) 25 MG tablet Take 1 tablet (25 mg total) by mouth 3 (three) times daily as needed for dizziness. 11/13/22   Johnson, Megan P, DO  triamcinolone  ointment (KENALOG ) 0.5 % Apply 1 Application topically 2 (two) times daily. 12/19/22   Vicci Duwaine SQUIBB, DO    Family History Family History  Problem Relation Age of Onset   Colon cancer Brother        31    Breast cancer Mother    Breast cancer Maternal Aunt    Pneumonia Father    Diabetes Maternal Grandmother     Social History Social History   Tobacco Use   Smoking status: Former    Current packs/day: 0.00    Average packs/day: 1 pack/day for 28.0 years (28.0 ttl pk-yrs)    Types: Cigarettes    Start date: 16    Quit date: 2011    Years since quitting: 14.8   Smokeless tobacco: Never   Tobacco comments:    Started smoking around 63 yrs old.    Smoked 1PPD at her heaviest.    Quit smoking in 2011- khj 08/07/2023  Vaping Use   Vaping status: Former  Substance Use Topics   Alcohol use: No    Alcohol/week: 0.0 standard drinks of alcohol    Comment: rare   Drug use: No     Allergies   Lisinopril    Review of Systems Review of Systems  Constitutional:  Negative for fever.  Musculoskeletal:  Positive for arthralgias. Negative for joint swelling and myalgias.  Skin: Negative.   All other systems reviewed and are negative.    Physical Exam Triage Vital Signs ED Triage Vitals  Encounter Vitals Group     BP 02/26/24 1122 (!) 106/53     Girls Systolic BP Percentile --      Girls Diastolic BP Percentile --      Boys Systolic BP Percentile --      Boys Diastolic BP Percentile --      Pulse Rate 02/26/24 1122 95      Resp 02/26/24 1122 17     Temp 02/26/24 1122 98.3 F (36.8 C)     Temp Source 02/26/24 1122 Oral     SpO2 02/26/24 1122 99 %     Weight 02/26/24 1120 162 lb (73.5 kg)  Height --      Head Circumference --      Peak Flow --      Pain Score 02/26/24 1119 7     Pain Loc --      Pain Education --      Exclude from Growth Chart --    No data found.  Updated Vital Signs BP (!) 106/53 (BP Location: Left Arm)   Pulse 95   Temp 98.3 F (36.8 C) (Oral)   Resp 17   Wt 162 lb (73.5 kg)   SpO2 99%   BMI 27.81 kg/m   Visual Acuity Right Eye Distance:   Left Eye Distance:   Bilateral Distance:    Right Eye Near:   Left Eye Near:    Bilateral Near:     Physical Exam Vitals and nursing note reviewed.  Constitutional:      General: She is not in acute distress.    Appearance: She is well-developed.  HENT:     Head: Normocephalic and atraumatic.  Eyes:     Conjunctiva/sclera: Conjunctivae normal.  Cardiovascular:     Rate and Rhythm: Normal rate and regular rhythm.     Heart sounds: No murmur heard. Pulmonary:     Effort: Pulmonary effort is normal. No respiratory distress.     Breath sounds: Normal breath sounds.  Abdominal:     Palpations: Abdomen is soft.     Tenderness: There is no abdominal tenderness.  Musculoskeletal:        General: No swelling.     Cervical back: Neck supple.     Right hip: Tenderness and bony tenderness present. Normal range of motion.  Skin:    General: Skin is warm and dry.     Capillary Refill: Capillary refill takes less than 2 seconds.  Neurological:     General: No focal deficit present.     Mental Status: She is alert and oriented to person, place, and time.     GCS: GCS eye subscore is 4. GCS verbal subscore is 5. GCS motor subscore is 6.  Psychiatric:        Attention and Perception: Attention normal.        Mood and Affect: Mood normal.        Speech: Speech normal.        Behavior: Behavior normal. Behavior is  cooperative.      UC Treatments / Results  Labs (all labs ordered are listed, but only abnormal results are displayed) Labs Reviewed - No data to display  EKG   Radiology DG Hip Unilat With Pelvis 2-3 Views Right Result Date: 02/26/2024 CLINICAL DATA:  Right hip pain 2 months.  No injury. EXAM: DG HIP (WITH OR WITHOUT PELVIS) 2-3V RIGHT COMPARISON:  None Available. FINDINGS: Bones, joint spaces and soft tissues over the hips are normal without fracture or dislocation. No focal lytic or sclerotic lesion over the hips. Minimal degenerate change over the spine, sacroiliac joints and symphysis pubis joint. IMPRESSION: 1. No acute findings. 2. Minimal degenerative changes as described. Electronically Signed   By: Toribio Agreste M.D.   On: 02/26/2024 12:47    Procedures Procedures (including critical care time)  Medications Ordered in UC Medications - No data to display  Initial Impression / Assessment and Plan / UC Course  I have reviewed the triage vital signs and the nursing notes.  Pertinent labs & imaging results that were available during my care of the patient were reviewed by me and considered  in my medical decision making (see chart for details).    Discussed exam findings and plan of care with patient, right hip x-ray is negative for fracture , scripted oxycodone  for pain , recommend patient follow-up with her PCP oncologist for referral to pain management if symptoms persist , strict go to ER precautions given.   Patient verbalized understanding to this provider.  Ddx: Right hip pain, fracture , bursitis, sciatica, metastatic cancer Final Clinical Impressions(s) / UC Diagnoses   Final diagnoses:  Right hip pain     Discharge Instructions      Your xray was negative. Take oxycodone  for pain as prescribed,drowsiness precautions. Do not drink alcohol or drive while taking this medication. Please follow up with PCP/oncologist for further pain management-call for  appt.     ED Prescriptions     Medication Sig Dispense Auth. Provider   oxyCODONE  (ROXICODONE ) 5 MG immediate release tablet Take 1 tablet (5 mg total) by mouth every 6 (six) hours as needed for up to 3 days for severe pain (pain score 7-10). 10 tablet Azucena Dart, Rilla, NP      I have reviewed the PDMP during this encounter.   Aminta Rilla, NP 02/26/24 2033

## 2024-02-26 NOTE — Discharge Instructions (Addendum)
 Your xray was negative. Take oxycodone  for pain as prescribed,drowsiness precautions. Do not drink alcohol or drive while taking this medication. Please follow up with PCP/oncologist for further pain management-call for appt.

## 2024-02-26 NOTE — ED Triage Notes (Signed)
 Pt presents with right hip pain x 2 months. Pt denies any injury. She was seen by her PCP 2 weeks ago and was prescribed Flexeril which is not helping.

## 2024-02-27 ENCOUNTER — Encounter: Payer: Self-pay | Admitting: Radiation Oncology

## 2024-02-27 ENCOUNTER — Telehealth: Payer: Self-pay

## 2024-02-27 ENCOUNTER — Other Ambulatory Visit: Payer: Self-pay | Admitting: *Deleted

## 2024-02-27 ENCOUNTER — Ambulatory Visit
Admission: RE | Admit: 2024-02-27 | Discharge: 2024-02-27 | Disposition: A | Discharge status: Home / Self Care | Source: Ambulatory Visit | Attending: Radiation Oncology | Admitting: Radiation Oncology

## 2024-02-27 ENCOUNTER — Telehealth: Payer: Self-pay | Admitting: Radiation Oncology

## 2024-02-27 ENCOUNTER — Other Ambulatory Visit: Payer: Self-pay

## 2024-02-27 ENCOUNTER — Inpatient Hospital Stay

## 2024-02-27 VITALS — BP 109/75 | HR 98 | Temp 97.1°F | Resp 16 | Wt 161.0 lb

## 2024-02-27 DIAGNOSIS — Z923 Personal history of irradiation: Secondary | ICD-10-CM | POA: Insufficient documentation

## 2024-02-27 DIAGNOSIS — C771 Secondary and unspecified malignant neoplasm of intrathoracic lymph nodes: Secondary | ICD-10-CM | POA: Insufficient documentation

## 2024-02-27 DIAGNOSIS — C3491 Malignant neoplasm of unspecified part of right bronchus or lung: Secondary | ICD-10-CM

## 2024-02-27 DIAGNOSIS — M25551 Pain in right hip: Secondary | ICD-10-CM | POA: Insufficient documentation

## 2024-02-27 DIAGNOSIS — C3401 Malignant neoplasm of right main bronchus: Secondary | ICD-10-CM | POA: Insufficient documentation

## 2024-02-27 NOTE — Progress Notes (Signed)
 Radiation Oncology Follow up Note  Name: Tanya Howell   Date:   02/27/2024 MRN:  [id number] DOB: 24-Mar-1961    This 63 y.o. 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  REFERRING PROVIDER: Vicci Duwaine SQUIBB, DO  HPI: Patient is a 63 year old female now out 4 months having completed concurrent chemoradiation therapy for stage IIIa adenocarcinoma the right lung.  Seen today in routine follow-up from a pulmonary standpoint she is asymptomatic specifically denies cough shortness of breath or any change in her pulmonary status.  She is having some problems with.  Right hip pain which she went to the ED for yesterday she is going to [facility] for follow-up today.  She had a recent CT scan of her chest showing significant interval change in appearance of the right lung which could reflect radiation pneumonitis pneumonia or aspiration.  Her first CT after treatment showed excellent response.  I believe Case is being presented at tumor board although will be contacting Dr. Babara.  Would like to rule out possible pneumonitis secondary to  Durvalumab   COMPLICATIONS OF TREATMENT: none  FOLLOW UP COMPLIANCE: keeps appointments   PHYSICAL EXAM:  BP 109/75   Pulse 98   Temp (!) 97.1 F (36.2 C) (Tympanic)   Resp 16   Wt 161 lb (73 kg)   BMI 27.64 kg/m  Well-developed well-nourished patient in NAD. HEENT reveals PERLA, EOMI, discs not visualized.  Oral cavity is clear. No oral mucosal lesions are identified. Neck is clear without evidence of cervical or supraclavicular adenopathy. Lungs are clear to A&P. Cardiac examination is essentially unremarkable with regular rate and rhythm without murmur rub or thrill. Abdomen is benign with no organomegaly or masses noted. Motor sensory and DTR levels are equal and symmetric in the upper and lower extremities. Cranial nerves II through XII are  grossly intact. Proprioception is intact. No peripheral adenopathy or edema is identified. No motor or sensory levels are noted. Crude visual fields are within normal range.  RADIOLOGY RESULTS: CT scans reviewed compatible with above-stated findings  PLAN: Present time I am good to talk to Dr. Babara possibly stopping Durvalumab  at this time since this can certainly lead to pneumonitis.  Little more fibrosis than I would expect at this point from radiation.  Do not believe this is pneumonia or aspiration since patient is totally asymptomatic afebrile.  I have asked to see her back in 3 to 4 months with a follow-up CT scan.  Patient continues close follow-up care with medical oncology.  I would like to take this opportunity to thank you for allowing me to participate in the care of your patient.SABRA [name], MD

## 2024-02-27 NOTE — Telephone Encounter (Signed)
 Called pt to sched CT - pt confirmed date/time/location - requested appt reminder via mail - LH

## 2024-02-27 NOTE — Telephone Encounter (Signed)
 Case was discussed on Tumor Board and it was recommended for patient to have a PET scan for further evaluation. It was also recommended for patient to see Pulmonology after the PET. Dr. Babara has discusssed with pulmonology and may be contacted by their office. The findings are possibly radiation changes, but there is a spot tat they would like to further investigate on PET.   Message sent to [facility] for Scheduling.

## 2024-02-28 ENCOUNTER — Encounter: Payer: Self-pay | Admitting: *Deleted

## 2024-02-28 ENCOUNTER — Telehealth: Payer: Self-pay

## 2024-02-28 NOTE — Telephone Encounter (Signed)
 Copied from CRM [id number]. Topic: General - Other >> Feb 28, 2024 12:19 PM Whitney O wrote: Reason for CRM: patient says dr assaker called her yesterday about a biopsy but patient had to talk with other doctor . Please give patient a call back concerning getting scheduled for a biopsy  [phone]

## 2024-03-03 ENCOUNTER — Encounter
Admission: RE | Admit: 2024-03-03 | Discharge: 2024-03-03 | Disposition: A | Discharge status: Home / Self Care | Source: Ambulatory Visit | Attending: Oncology | Admitting: Oncology

## 2024-03-03 ENCOUNTER — Telehealth: Payer: Self-pay | Admitting: Pulmonary Disease

## 2024-03-03 ENCOUNTER — Encounter: Payer: Self-pay | Admitting: Oncology

## 2024-03-03 DIAGNOSIS — R911 Solitary pulmonary nodule: Secondary | ICD-10-CM | POA: Insufficient documentation

## 2024-03-03 DIAGNOSIS — C3491 Malignant neoplasm of unspecified part of right bronchus or lung: Secondary | ICD-10-CM | POA: Diagnosis present

## 2024-03-03 LAB — GLUCOSE, CAPILLARY: Glucose-Capillary: 76 mg/dL (ref 70–99)

## 2024-03-03 MED ORDER — FLUDEOXYGLUCOSE F - 18 (FDG) INJECTION
8.3000 | Freq: Once | INTRAVENOUS | Status: AC | PRN
  Administered 2024-03-03: 8.79 via INTRAVENOUS

## 2024-03-03 NOTE — Telephone Encounter (Signed)
 CT has been scheduled on 04/05/24 @ 9:30am arrival time 9:00am at [facility] Entrance

## 2024-03-03 NOTE — Telephone Encounter (Signed)
 The patient is aware of all procedures, dates and times. Bronch email has been sent.  Tanya Howell please let me know once the procedure has been approved.

## 2024-03-03 NOTE — Telephone Encounter (Signed)
 Noted. Nothing further needed.

## 2024-03-03 NOTE — Telephone Encounter (Signed)
 For the codes 68372, [id number], [id number] Prior Auth Not Required Refer # [id number]

## 2024-03-03 NOTE — Telephone Encounter (Signed)
 I discussed the results of the recent PET scan with Tanya Howell, the RLL Nodule with high FDG activity and an SUV of 8. We discussed proceeding with a biopsy of the nodule which she is agreeable with. Will schedule on 03/17/2024.  Darrin Barn, MD [facility] 03/03/2024 1:29 PM

## 2024-03-03 NOTE — Telephone Encounter (Addendum)
 Robotic Bronch with EBUS 03/17/2024 at 7:30am Lung nodule [id number], [id number], [id number]  Donzell please see bronch info and schedule CT. Thank  you!

## 2024-03-09 ENCOUNTER — Other Ambulatory Visit: Payer: Self-pay | Admitting: Hospice and Palliative Medicine

## 2024-03-09 ENCOUNTER — Other Ambulatory Visit: Payer: Self-pay

## 2024-03-09 ENCOUNTER — Inpatient Hospital Stay

## 2024-03-09 ENCOUNTER — Encounter: Payer: Self-pay | Admitting: Oncology

## 2024-03-09 ENCOUNTER — Telehealth: Payer: Self-pay

## 2024-03-09 ENCOUNTER — Inpatient Hospital Stay: Attending: Oncology | Admitting: Oncology

## 2024-03-09 VITALS — BP 109/70 | HR 100 | Temp 97.5°F | Resp 18 | Wt 159.3 lb

## 2024-03-09 DIAGNOSIS — Z79891 Long term (current) use of opiate analgesic: Secondary | ICD-10-CM | POA: Insufficient documentation

## 2024-03-09 DIAGNOSIS — Z86711 Personal history of pulmonary embolism: Secondary | ICD-10-CM | POA: Insufficient documentation

## 2024-03-09 DIAGNOSIS — C3491 Malignant neoplasm of unspecified part of right bronchus or lung: Secondary | ICD-10-CM | POA: Insufficient documentation

## 2024-03-09 DIAGNOSIS — I2693 Single subsegmental pulmonary embolism without acute cor pulmonale: Secondary | ICD-10-CM

## 2024-03-09 DIAGNOSIS — C7951 Secondary malignant neoplasm of bone: Secondary | ICD-10-CM | POA: Diagnosis not present

## 2024-03-09 DIAGNOSIS — Z5111 Encounter for antineoplastic chemotherapy: Secondary | ICD-10-CM | POA: Insufficient documentation

## 2024-03-09 DIAGNOSIS — Z800 Family history of malignant neoplasm of digestive organs: Secondary | ICD-10-CM | POA: Diagnosis not present

## 2024-03-09 DIAGNOSIS — Z803 Family history of malignant neoplasm of breast: Secondary | ICD-10-CM | POA: Insufficient documentation

## 2024-03-09 DIAGNOSIS — Z7952 Long term (current) use of systemic steroids: Secondary | ICD-10-CM | POA: Diagnosis not present

## 2024-03-09 DIAGNOSIS — G893 Neoplasm related pain (acute) (chronic): Secondary | ICD-10-CM | POA: Insufficient documentation

## 2024-03-09 DIAGNOSIS — R609 Edema, unspecified: Secondary | ICD-10-CM | POA: Diagnosis not present

## 2024-03-09 DIAGNOSIS — E538 Deficiency of other specified B group vitamins: Secondary | ICD-10-CM | POA: Insufficient documentation

## 2024-03-09 DIAGNOSIS — I2699 Other pulmonary embolism without acute cor pulmonale: Secondary | ICD-10-CM | POA: Diagnosis not present

## 2024-03-09 DIAGNOSIS — M8580 Other specified disorders of bone density and structure, unspecified site: Secondary | ICD-10-CM | POA: Diagnosis not present

## 2024-03-09 DIAGNOSIS — Z510 Encounter for antineoplastic radiation therapy: Secondary | ICD-10-CM | POA: Diagnosis present

## 2024-03-09 DIAGNOSIS — Z452 Encounter for adjustment and management of vascular access device: Secondary | ICD-10-CM | POA: Insufficient documentation

## 2024-03-09 DIAGNOSIS — M25551 Pain in right hip: Secondary | ICD-10-CM

## 2024-03-09 DIAGNOSIS — Z7901 Long term (current) use of anticoagulants: Secondary | ICD-10-CM | POA: Insufficient documentation

## 2024-03-09 DIAGNOSIS — C7931 Secondary malignant neoplasm of brain: Secondary | ICD-10-CM | POA: Diagnosis not present

## 2024-03-09 DIAGNOSIS — Z87891 Personal history of nicotine dependence: Secondary | ICD-10-CM | POA: Diagnosis not present

## 2024-03-09 DIAGNOSIS — Z5112 Encounter for antineoplastic immunotherapy: Secondary | ICD-10-CM

## 2024-03-09 LAB — CBC WITH DIFFERENTIAL (CANCER CENTER ONLY)
Abs Immature Granulocytes: 0.02 K/uL (ref 0.00–0.07)
Basophils Absolute: 0 K/uL (ref 0.0–0.1)
Basophils Relative: 1 %
Eosinophils Absolute: 0.2 K/uL (ref 0.0–0.5)
Eosinophils Relative: 4 %
HCT: 34.5 % — ABNORMAL LOW (ref 36.0–46.0)
Hemoglobin: 11.4 g/dL — ABNORMAL LOW (ref 12.0–15.0)
Immature Granulocytes: 0 %
Lymphocytes Relative: 13 %
Lymphs Abs: 0.8 K/uL (ref 0.7–4.0)
MCH: 27.3 pg (ref 26.0–34.0)
MCHC: 33 g/dL (ref 30.0–36.0)
MCV: 82.7 fL (ref 80.0–100.0)
Monocytes Absolute: 0.8 K/uL (ref 0.1–1.0)
Monocytes Relative: 13 %
Neutro Abs: 4.2 K/uL (ref 1.7–7.7)
Neutrophils Relative %: 69 %
Platelet Count: 212 K/uL (ref 150–400)
RBC: 4.17 MIL/uL (ref 3.87–5.11)
RDW: 13.4 % (ref 11.5–15.5)
WBC Count: 6.1 K/uL (ref 4.0–10.5)
nRBC: 0 % (ref 0.0–0.2)

## 2024-03-09 LAB — CMP (CANCER CENTER ONLY)
ALT: 24 U/L (ref 0–44)
AST: 17 U/L (ref 15–41)
Albumin: 3.8 g/dL (ref 3.5–5.0)
Alkaline Phosphatase: 95 U/L (ref 38–126)
Anion gap: 10 (ref 5–15)
BUN: 14 mg/dL (ref 8–23)
CO2: 25 mmol/L (ref 22–32)
Calcium: 9.4 mg/dL (ref 8.9–10.3)
Chloride: 103 mmol/L (ref 98–111)
Creatinine: 0.66 mg/dL (ref 0.44–1.00)
GFR, Estimated: >60 mL/min (ref >60)
Glucose, Bld: 84 mg/dL (ref 70–99)
Potassium: 3.7 mmol/L (ref 3.5–5.1)
Sodium: 139 mmol/L (ref 135–145)
Total Bilirubin: 0.3 mg/dL (ref 0.0–1.2)
Total Protein: 6.4 g/dL — ABNORMAL LOW (ref 6.5–8.1)

## 2024-03-09 MED ORDER — FOLIC ACID 1 MG PO TABS: 1.0000 mg | ORAL_TABLET | Freq: Every day | ORAL | 3 refills | Status: AC

## 2024-03-09 MED ORDER — OXYCODONE HCL 5 MG PO TABS: 5.0000 mg | ORAL_TABLET | ORAL | 0 refills | Status: AC | PRN

## 2024-03-09 NOTE — Progress Notes (Signed)
 Hematology/Oncology Progress note [phone] [phone]       CHIEF COMPLAINTS/PURPOSE OF CONSULTATION:  Stage III lung adenocarcinoam  ASSESSMENT & PLAN:   Cancer Staging  Primary lung adenocarcinoma, right [facility]) Staging form: Lung, AJCC V9 - Clinical stage from 08/15/2023: Tanya Howell, cM0 - Signed by Babara Call, MD on 08/15/2023   Primary lung adenocarcinoma, right [facility]) Images and pathology results were reviewed with the patient. cT2 N2 M0, Stage III right lung adenocarcinoma.  NGS showed TPS 5% CPS 10  KRAS G12C, TMB 12.6   Labs are reviewed and discussed with patient. Proceed with Durvalumab  Q 2weeks  02/13/24 CT scan showed  soft tissue thickening in the right infrahilar region and medial right lower lobe. New extensive airspace opacification in the right perihilar region and right paramediastinal lung in the right middle lobe and right lower lobe. new airspace opacity in the right lower lobe, new spiculated nodule in the right lower lobe 03/03/2024, PET scan findings are concerning for recurrent metastatic lung cancer. She has large hypermetabolic right iliac bone lesion, right lower lobe hypermetabolic lesion and left adrenal gland lesion. Discussed with patient husband about disease recurrence.  I will obtain liquid NGS. Discussed about option of biopsy of right lower lobe lesion via bronchoscopy, patient is currently on Eliquis  for anticoagulation.  Clinically she has disease recurrence.  Shared decision was made to forego right lower lesion biopsy for now.  She will be referred to radiation oncology for palliative radiation to right iliac bone lesion and to start with systemic chemotherapy-recommend carboplatin /Alimta/Keytruda as first-line treatment of stage IV setting.  Second line treatment option consider Adagrasib  Encounter for antineoplastic immunotherapy Hold off Durvalumab  today.  Pulmonary embolism (HCC) Anticoagulation with Eliquis  5mg  BID with  disease recurrence, I will keep patient on Eliquis  5 mg twice daily for now  Right hip pain Pain is secondary to large right iliac bone mass.  Refer to radiation oncology for palliative radiation.  With Oxycodone  5 mg every 4-6 hours as needed for pain      Orders Placed This Encounter  Procedures   CBC with Differential (Cancer Center Only)    Standing Status:   Future    Expected Date:   03/16/2024    Expiration Date:   03/16/2025   CMP (Cancer Center only)    Standing Status:   Future    Expected Date:   03/16/2024    Expiration Date:   03/16/2025   T4    Standing Status:   Future    Expected Date:   03/16/2024    Expiration Date:   03/16/2025   TSH    Standing Status:   Future    Expected Date:   03/16/2024    Expiration Date:   03/16/2025   Follow-up 1 week All questions were answered. The patient knows to call the clinic with any problems, questions or concerns.  Call Babara, MD, PhD [facility] Hematology Oncology 03/09/2024    HISTORY OF PRESENTING ILLNESS:  Tanya Howell 63 y.o. female presents to establish care for lung cancer  Oncology History  Primary lung adenocarcinoma, right (HCC)  08/03/2023 Imaging   CT angiogram chest PE protocol showed  1. 3.5 x 2.6 cm right hilar mass, with obstruction of the bronchus intermedius, right middle lobe bronchus, and portions of the right lower lobe bronchus as above, highly concerning for neoplasm. Bronchoscopy is recommended for further evaluation. 2. Subcarinal adenopathy concerning for metastatic disease. 3. Dense medial segment consolidation within  the right middle lobe with associated volume loss, consistent with atelectasis. 4. Prominent right lower lobe bronchiectasis, with fluid-filled bronchi and patchy right basilar consolidation consistent with combination of airspace disease and atelectasis. 5. No evidence of pulmonary embolus. 6. Aortic Atherosclerosis (ICD10-I70.0). Coronary artery atherosclerosis.    08/06/2023 Initial Diagnosis   Primary lung adenocarcinoma, right (HCC)  08/03/2023, patient presented to emergency room for evaluation of hemoptysis. Patient had pneumonia in March 2025, she continues to have a lingering cough for the past months despite being treated with doxycycline  and prednisone . She coughed up small amount of bright red blood on 08/03/2023.  Denies any shortness of breath, chest pain unintentional weight loss headache.  She felt feverish with chills 2 nights ago. Patient is a former smoker, quit smoking in 2011. Patient denies being on any antiplatelet or anticoagulation agents.  08/08/2023 She underwent biopsy via bronchoscopy.  FINAL MICROSCOPIC DIAGNOSIS:  A. LUNG, RLL, BRUSHING:  - Adenocarcinoma   B. LUNG, RLL, FINE NEEDLE ASPIRATION:  - Adenocarcinoma   Immunohistochemical stains were performed to characterize the tumor cells. The cells are negative for TTF-1, Napsin A, p40, synaptophysin, Chromogranin A, and CD56.  Mucicarmine stain highlights intracytoplasmic mucin. Ki67 proliferation index is approximately 30%. The overall findings are supportive of the diagnosis of adenocarcinoma.   C. LYMPH NODE, 7, FINE NEEDLE ASPIRATION:  - Adenocarcinoma  - Lymphoid tissue present   D. LYMPH NODE, 4R, FINE NEEDLE ASPIRATION:  - No malignant cells identified  - Lymphoid tissue present   E. LYMPH NODE, 11R, FINE NEEDLE ASPIRATION:  - No malignant cells identified  - Lymphoid tissue present   F. LUNG, RLL, LAVAGE:  FINAL MICROSCOPIC DIAGNOSIS:  - Atypical cells present    08/15/2023 Cancer Staging   Staging form: Lung, AJCC V9 - Clinical stage from 08/15/2023: Tanya Howell, cM0 - Signed by Babara Call, MD on 08/15/2023 Stage prefix: Initial diagnosis Method of lymph node assessment: Clinical   09/09/2023 - 10/21/2023 Chemotherapy   Patient is on Treatment Plan : LUNG Carboplatin  + Paclitaxel  + XRT q7d     11/22/2023 Imaging   CT chest with contrast   1. Diminished  size subcarinal and right hilar node or mass, measuring 1.5 x 0.9 cm, previously 3.4 x 3.2 cm. Findings are consistent with treatment response. 2. No evidence of new or progressive disease in the chest. 3. Coronary artery disease   11/29/2023 - 02/21/2024 Chemotherapy   Patient is on Treatment Plan : LUNG Durvalumab  (10) q14d     02/17/2024 Imaging   CT chest w contrast  1. Significant interval change in appearance of the right lung as detailed above. This certainly could reflect radiation pneumonitis if the patient has received any new RT. Pneumonia or aspiration would be another consideration. I believe it is unlikely that this is recurrent tumor given the fact that the last CT showed a very good response to treatment and given its rapid onset and extensive appearance. However, that does remain a possibility. Recommend correlation with clinical findings of pneumonia and appropriate treatment. A short-term follow-up chest CT in 2-3 months may be helpful to reassess this process. 2. 12 mm spiculated appearing nodule in the right lower lobe laterally could be part of the same process. 3. No findings for her metastatic disease involving the upper abdomen or bony structures.   03/03/2024 Imaging   PET scan showed 1. New large lytic bone lesion with overlying cortical destruction in the right iliac bone measuring 5.0 x 5.3 cm with  SUV max of 43.3, compatible with osseous metastasis. 2. New tracer-avid nodule within the left adrenal gland measuring 0.9 cm with SUV max of 10.1, suspicious for metastatic disease. 3. Tracer-avid right lower lobe pulmonary nodule measuring 1.1 cm with SUV max of 9.5, increased from 0.8 cm on 11/22/2023 and slightly decreased from 1.3 cm on 02/13/2024, suspicious for active malignancy. 4. Interval resolution of the previous tracer avid right hilar mass and adjacent subcarinal hypermetabolic lymph node. 5. Extensive right perihilar and paramediastinal fibrotic  change compatible with post-radiation effect, with increased tracer uptake reflecting treatment-related inflammation.   03/16/2024 -  Chemotherapy   Patient is on Treatment Plan : LUNG Carboplatin  (5) + Pemetrexed (500) + Pembrolizumab (200) D1 q21d Induction x 4 cycles / Maintenance Pemetrexed (500) + Pembrolizumab (200) D1 q21d      09/23/2023 diagnosis of Segmental pulmonary emboli in the lingula. She takes Eliquis  5mg  BID She denies any cough, SOB chest pain, diarrhea. She tolerates immunotherapy.  Right side sciatic pain-patient had a PET scan done and presented to discuss results.  MEDICAL HISTORY:  Past Medical History:  Diagnosis Date   COVID-19 07/2018   Hyperlipidemia    Hypertension    Lung cancer (HCC) 07/2023   Motion sickness    amuesment park rides   Osteopenia 11/01/2015   Pneumonia    april 2025   Psoriasis    Vertigo    none for over 5 yrs   Wears dentures    full upper and lower    SURGICAL HISTORY: Past Surgical History:  Procedure Laterality Date   BRONCHIAL BIOPSY  08/08/2023   Procedure: BRONCHOSCOPY, WITH BIOPSY;  Surgeon: Malka Domino, MD;  Location: [facility];  Service: Pulmonary;;   BRONCHIAL BRUSHINGS  08/08/2023   Procedure: BRONCHOSCOPY, WITH BRUSH BIOPSY;  Surgeon: Malka Domino, MD;  Location: [facility];  Service: Pulmonary;;   BRONCHIAL NEEDLE ASPIRATION BIOPSY  08/08/2023   Procedure: BRONCHOSCOPY, WITH NEEDLE ASPIRATION BIOPSY;  Surgeon: Malka Domino, MD;  Location: [facility];  Service: Pulmonary;;   CHOLECYSTECTOMY  1990   COLONOSCOPY WITH PROPOFOL  N/A 07/15/2020   Procedure: COLONOSCOPY WITH PROPOFOL ;  Surgeon: Jinny Carmine, MD;  Location: [facility];  Service: Endoscopy;  Laterality: N/A;  priority 4   ENDOBRONCHIAL ULTRASOUND Bilateral 08/08/2023   Procedure: ENDOBRONCHIAL ULTRASOUND (EBUS);  Surgeon: Malka Domino, MD;  Location: [facility];  Service: Pulmonary;  Laterality: Bilateral;   IR  IMAGING GUIDED PORT INSERTION  08/29/2023   TUBAL LIGATION      SOCIAL HISTORY: Social History   Socioeconomic History   Marital status: Married    Spouse name: Oneil   Number of children: 1   Years of education: Not on file   Highest education level: High school graduate  Occupational History   Not on file  Tobacco Use   Smoking status: Former    Current packs/day: 0.00    Average packs/day: 1 pack/day for 28.0 years (28.0 ttl pk-yrs)    Types: Cigarettes    Start date: 80    Quit date: 2011    Years since quitting: 14.9   Smokeless tobacco: Never   Tobacco comments:    Started smoking around 63 yrs old.    Smoked 1PPD at her heaviest.    Quit smoking in 2011- leda 08/07/2023  Vaping Use   Vaping status: Former  Substance and Sexual Activity   Alcohol use: No    Alcohol/week: 0.0 standard drinks of alcohol    Comment: rare   Drug  use: No   Sexual activity: Yes    Birth control/protection: Surgical  Other Topics Concern   Not on file  Social History Narrative   Not on file   Social Drivers of Health   Financial Resource Strain: Low Risk  (05/06/2018)   Overall Financial Resource Strain (CARDIA)    Difficulty of Paying Living Expenses: Not hard at all  Food Insecurity: No Food Insecurity (09/24/2023)   Hunger Vital Sign    Worried About Running Out of Food in the Last Year: Never true    Ran Out of Food in the Last Year: Never true  Transportation Needs: No Transportation Needs (09/24/2023)   PRAPARE - Administrator, Civil Service (Medical): No    Lack of Transportation (Non-Medical): No  Physical Activity: Insufficiently Active (02/11/2024)   Exercise Vital Sign    Days of Exercise per Week: 4 days    Minutes of Exercise per Session: 30 min  Stress: No Stress Concern Present (02/11/2024)   [employer] of Occupational Health - Occupational Stress Questionnaire    Feeling of Stress: Only a little  Social Connections: Moderately Isolated  (09/24/2023)   Social Connection and Isolation Panel    Frequency of Communication with Friends and Family: More than three times a week    Frequency of Social Gatherings with Friends and Family: Twice a week    Attends Religious Services: Never    Database Administrator or Organizations: No    Attends Banker Meetings: Never    Marital Status: Married  Catering Manager Violence: Not At Risk (09/24/2023)   Humiliation, Afraid, Rape, and Kick questionnaire    Fear of Current or Ex-Partner: No    Emotionally Abused: No    Physically Abused: No    Sexually Abused: No    FAMILY HISTORY: Family History  Problem Relation Age of Onset   Colon cancer Brother        81    Breast cancer Mother    Breast cancer Maternal Aunt    Pneumonia Father    Diabetes Maternal Grandmother     ALLERGIES:  is allergic to lisinopril .  MEDICATIONS:  Current Outpatient Medications  Medication Sig Dispense Refill   apixaban  (ELIQUIS ) 5 MG TABS tablet Take 1 tablet (5 mg total) by mouth 2 (two) times daily. 60 tablet 4   atorvastatin  (LIPITOR) 10 MG tablet Take 1 tablet (10 mg total) by mouth at bedtime. 90 tablet 0   cyclobenzaprine  (FLEXERIL ) 10 MG tablet Take 1 tablet (10 mg total) by mouth at bedtime. 30 tablet 0   levalbuterol  (XOPENEX  HFA) 45 MCG/ACT inhaler Inhale 2 puffs into the lungs every 6 (six) hours as needed for wheezing. 15 g 3   lidocaine -prilocaine  (EMLA ) cream Apply to affected area once 30 g 3   losartan  (COZAAR ) 25 MG tablet Take 1 tablet (25 mg total) by mouth daily. 45 tablet 0   meclizine  (ANTIVERT ) 25 MG tablet Take 1 tablet (25 mg total) by mouth 3 (three) times daily as needed for dizziness. 30 tablet 3   triamcinolone  ointment (KENALOG ) 0.5 % Apply 1 Application topically 2 (two) times daily. 30 g 6   ciprofloxacin  (CIPRO ) 250 MG tablet Take 1 tablet (250 mg total) by mouth 2 (two) times daily. (Patient not taking: Reported on 03/09/2024) 10 tablet 0   folic acid  (FOLVITE) 1 MG tablet Take 1 tablet (1 mg total) by mouth daily. Start 7 days before pemetrexed chemotherapy. Continue until 39  days after pemetrexed completed. 100 tablet 3   oxyCODONE  (OXY IR/ROXICODONE ) 5 MG immediate release tablet Take 1 tablet (5 mg total) by mouth every 4 (four) hours as needed for severe pain (pain score 7-10). 45 tablet 0   No current facility-administered medications for this visit.    Review of Systems  Constitutional:  Negative for appetite change, chills, fatigue and fever.  HENT:   Negative for hearing loss and voice change.   Eyes:  Negative for eye problems.  Respiratory:  Negative for chest tightness, cough and hemoptysis.   Cardiovascular:  Negative for chest pain.  Gastrointestinal:  Negative for abdominal distention, abdominal pain and blood in stool.  Endocrine: Negative for hot flashes.  Genitourinary:  Negative for difficulty urinating and frequency.   Musculoskeletal:  Negative for arthralgias.       Right sciatic pain  Skin:  Negative for itching and rash.  Neurological:  Negative for extremity weakness.  Hematological:  Negative for adenopathy.  Psychiatric/Behavioral:  Negative for confusion.      PHYSICAL EXAMINATION: ECOG PERFORMANCE STATUS: 0 - Asymptomatic  Vitals:   03/09/24 0845  BP: 109/70  Pulse: 100  Resp: 18  Temp: (!) 97.5 F (36.4 C)  SpO2: 98%   Filed Weights   03/09/24 0845  Weight: 159 lb 4.8 oz (72.3 kg)    Physical Exam Constitutional:      General: She is not in acute distress.    Appearance: She is not diaphoretic.  HENT:     Head: Normocephalic and atraumatic.  Eyes:     General: No scleral icterus. Cardiovascular:     Rate and Rhythm: Normal rate and regular rhythm.     Heart sounds: No murmur heard. Pulmonary:     Effort: Pulmonary effort is normal. No respiratory distress.     Breath sounds: No wheezing.  Abdominal:     General: There is no distension.     Palpations: Abdomen is soft.      Tenderness: There is no abdominal tenderness.  Musculoskeletal:        General: Normal range of motion.     Cervical back: Normal range of motion and neck supple.  Skin:    General: Skin is warm and dry.     Findings: No erythema.  Neurological:     Mental Status: She is alert and oriented to person, place, and time. Mental status is at baseline.     Motor: No abnormal muscle tone.  Psychiatric:        Mood and Affect: Affect normal.      LABORATORY DATA:  I have reviewed the data as listed    Latest Ref Rng & Units 03/09/2024    8:16 AM 02/21/2024    8:51 AM 02/07/2024    8:41 AM  CBC  WBC 4.0 - 10.5 K/uL 6.1  6.1  5.3   Hemoglobin 12.0 - 15.0 g/dL 88.5  87.7  87.9   Hematocrit 36.0 - 46.0 % 34.5  36.9  36.0   Platelets 150 - 400 K/uL 212  243  272       Latest Ref Rng & Units 03/09/2024    8:16 AM 02/21/2024    8:51 AM 02/07/2024    8:41 AM  CMP  Glucose 70 - 99 mg/dL 84  96  891   BUN 8 - 23 mg/dL 14  14  11    Creatinine 0.44 - 1.00 mg/dL 9.33  9.21  9.34   Sodium 135 - 145  mmol/L 139  138  138   Potassium 3.5 - 5.1 mmol/L 3.7  3.6  3.5   Chloride 98 - 111 mmol/L 103  105  104   CO2 22 - 32 mmol/L 25  24  24    Calcium  8.9 - 10.3 mg/dL 9.4  9.4  9.2   Total Protein 6.5 - 8.1 g/dL 6.4  7.0  7.0   Total Bilirubin 0.0 - 1.2 mg/dL 0.3  0.4  0.6   Alkaline Phos 38 - 126 U/L 95  75  72   AST 15 - 41 U/L 17  22  22    ALT 0 - 44 U/L 24  26  29       RADIOGRAPHIC STUDIES: I have personally reviewed the radiological images as listed and agreed with the findings in the report. NM PET Image Restag (PS) Skull Base To Thigh Result Date: 03/09/2024 EXAM: PET AND CT SKULL BASE TO MID THIGH 03/03/2024 11:39:26 AM TECHNIQUE: RADIOPHARMACEUTICAL: 8.79 mCi F-18 FDG Uptake time 60 minutes. Glucose level 76 mg/dl. PET imaging was acquired from the base of the skull to the mid thighs. Non-contrast enhanced computed tomography was obtained for attenuation correction and anatomic  localization. COMPARISON: CT chest 02/13/2024 and PET/CT from 08/09/2023. CLINICAL HISTORY: lung cancer FINDINGS: HEAD AND NECK: No metabolically active cervical lymphadenopathy. CHEST: Extensive paramediastinal perihilar and paramediastinal fibrotic change involving the right middle, right upper, and right lower lobes compatible with changes due to external beam radiation. Increased tracer uptake throughout this area reflecting post-radiation inflammatory change. There has been interval resolution of the previous tracer avid right hilar mass and adjacent subcarinal hypermetabolic lymph node. Tracer avid nodule within the right lower lobe measures 1.1 cm with an SUV max of 9.5, image 59 of the axial images. On the CT of the chest from 02/13/2024 this nodule measured 1.3 cm. On the CT from 11/22/2023 this nodule measured 0.8 cm. No metabolically active lymphadenopathy. Coronary artery calcifications. Mild cardiac enlargement. Trace pericardial effusion is new from the previous exam, image 60/6. ABDOMEN AND PELVIS: Within the left adrenal gland, there is a tracer-avid nodule measuring 0.9 mm with SUV max of 10.1, axial image 78. This is new when compared with the previous PET/CT from 08/09/2023. Normal appearance of the right adrenal gland. Status post cholecystectomy. Aortic atherosclerotic calcifications. Punctate stone noted within the upper pole of the right kidney. Physiologic activity within the gastrointestinal and genitourinary systems. No metabolically active intraperitoneal mass. No metabolically active lymphadenopathy. BONES AND SOFT TISSUE: Within the right iliac bone there is a new large lytic bone lesion with overlying cortical destruction measuring 5.0 x 5.3 cm with SUV max of 43.3, axial image 128. IMPRESSION: 1. New large lytic bone lesion with overlying cortical destruction in the right iliac bone measuring 5.0 x 5.3 cm with SUV max of 43.3, compatible with osseous metastasis. 2. New tracer-avid  nodule within the left adrenal gland measuring 0.9 cm with SUV max of 10.1, suspicious for metastatic disease. 3. Tracer-avid right lower lobe pulmonary nodule measuring 1.1 cm with SUV max of 9.5, increased from 0.8 cm on 11/22/2023 and slightly decreased from 1.3 cm on 02/13/2024, suspicious for active malignancy. 4. Interval resolution of the previous tracer avid right hilar mass and adjacent subcarinal hypermetabolic lymph node. 5. Extensive right perihilar and paramediastinal fibrotic change compatible with post-radiation effect, with increased tracer uptake reflecting treatment-related inflammation. Electronically signed by: Waddell Calk MD 03/09/2024 08:41 AM EST RP Workstation: [id number]   DG Hip Unilat With Pelvis  2-3 Views Right Result Date: 02/26/2024 CLINICAL DATA:  Right hip pain 2 months.  No injury. EXAM: DG HIP (WITH OR WITHOUT PELVIS) 2-3V RIGHT COMPARISON:  None Available. FINDINGS: Bones, joint spaces and soft tissues over the hips are normal without fracture or dislocation. No focal lytic or sclerotic lesion over the hips. Minimal degenerate change over the spine, sacroiliac joints and symphysis pubis joint. IMPRESSION: 1. No acute findings. 2. Minimal degenerative changes as described. Electronically Signed   By: Toribio Agreste M.D.   On: 02/26/2024 12:47   CT Chest W Contrast Result Date: 02/17/2024 EXAM: CT CHEST WITH CONTRAST 02/13/2024 09:04:10 AM TECHNIQUE: CT of the chest was performed with the administration of intravenous contrast. Multiplanar reformatted images are provided for review. Automated exposure control, iterative reconstruction, and/or weight based adjustment of the mA/kV was utilized to reduce the radiation dose to as low as reasonably achievable. CONTRAST: 80 mL of Omnipaque  300. COMPARISON: CT Chest 11/22/2023. CLINICAL HISTORY: Non-small cell lung cancer (NSCLC), non-metastatic, assess treatment response. Stage III lung adenocarcinoma Right. FINDINGS: MEDIASTINUM:  The heart is normal in size. No pericardial effusion. The aorta is normal in caliber with stable atherosclerotic calcification. Stable scattered 3-vessel coronary artery calcifications. The port-a-cath is in good position without complicating features. The pulmonary arteries are grossly normal. The central airways are clear. The esophagus is grossly normal. LYMPH NODES: Right infrahilar node measures 10 mm (image 74/2), increased from 9 mm on the prior CT of 11/22/2023. No other mediastinal, hilar, supraclavicular, or axillary lymphadenopathy. LUNGS AND PLEURA: New soft tissue thickening in the right infrahilar region and medial right lower lobe. New extensive airspace opacification in the right perihilar region and right paramediastinal lung in the right middle lobe and right lower lobe. I don't see that the patient has had any recent radiation but it certainly has the appearance of such. Other possibilities would include pneumonia, aspiration, or recurrent tumor. There is also new airspace opacity in the right lower lobe in the absence of esophageal recess measuring approximately 2.3 x 2.0 cm on image 58/4. There is also a new spiculated nodule in the right lower lobe on image number 84/4. The left lung remains clear. No pleural effusion, pleural lesions, or pneumothorax. SOFT TISSUES/BONES: No breast masses. The bony thorax is intact. No acute soft tissue abnormality. UPPER ABDOMEN: Limited images of the upper abdomen demonstrate no significant findings. Stable vascular calcifications. No hepatic or adrenal gland lesions. IMPRESSION: 1. Significant interval change in appearance of the right lung as detailed above. This certainly could reflect radiation pneumonitis if the patient has received any new RT. Pneumonia or aspiration would be another consideration. I believe it is unlikely that this is recurrent tumor given the fact that the last CT showed a very good response to treatment and given its rapid onset and  extensive appearance. However, that does remain a possibility. Recommend correlation with clinical findings of pneumonia and appropriate treatment. A short-term follow-up chest CT in 2-3 months may be helpful to reassess this process. 2. 12 mm spiculated appearing nodule in the right lower lobe laterally could be part of the same process. 3. New findings for her metastatic disease involving the upper abdomen or bony structures. Electronically signed by: Maude Stammer MD 02/17/2024 11:31 AM EST RP Workstation: [id number]

## 2024-03-09 NOTE — Progress Notes (Signed)
Patient here for follow up. No new concerns voiced.  °

## 2024-03-09 NOTE — Assessment & Plan Note (Signed)
 Pain is secondary to large right iliac bone mass.  Refer to radiation oncology for palliative radiation.  With Oxycodone  5 mg every 4-6 hours as needed for pain

## 2024-03-09 NOTE — Assessment & Plan Note (Signed)
 Hold off Durvalumab  today.

## 2024-03-09 NOTE — Assessment & Plan Note (Signed)
 Anticoagulation with Eliquis  5mg  BID with disease recurrence, I will keep patient on Eliquis  5 mg twice daily for now

## 2024-03-09 NOTE — Telephone Encounter (Signed)
 [facility] xF testing reuested. Blood collected 03/09/24.    Financial assistance application completed and approved. Patient's out-of-pocket cost will be $0

## 2024-03-09 NOTE — Progress Notes (Signed)
 Discussed with Dr. Babara. Oxycodone  5mg  Q4H PRN #45. PDMP reviewed.

## 2024-03-09 NOTE — Progress Notes (Signed)
 DISCONTINUE ON PATHWAY REGIMEN - Non-Small Cell Lung     A cycle is every 14 days:     Durvalumab    **Always confirm dose/schedule in your pharmacy ordering system**  PRIOR TREATMENT: [id number]: Durvalumab  10 mg/kg q14 Days x up to 12 Months  START ON PATHWAY REGIMEN - Non-Small Cell Lung     A cycle is every 21 days:     Pembrolizumab      Pemetrexed      Carboplatin    **Always confirm dose/schedule in your pharmacy ordering system**  Patient Characteristics: Stage IV Metastatic, Nonsquamous, Molecular Analysis Completed, Molecular Alteration Present and Targeted Therapy Exhausted, OR EGFR/KRAS/HER2/NRG1/c-Met Present and Standard Chemotherapy/Immunotherapy Planned, OR No Alteration Present, Initial  Chemotherapy/Immunotherapy, PS = 0, 1, BRAF/MET/KRAS/HER2 Mutation Positive or c-Met High Overexpression Positive (EGFR Wildtype), Candidate for Immunotherapy, PD-L1 Expression Positive 1-49% (TPS) / Negative / Not Tested / Awaiting Test Results and  Immunotherapy Candidate Therapeutic Status: Stage IV Metastatic Histology: Nonsquamous Cell Broad Molecular Profiling Status: Molecular Analysis Completed Molecular Analysis Results: KRAS G12C Mutation Present and No Prior Chemo/Immunotherapy Chemotherapy/Immunotherapy Line of Therapy: Initial Chemotherapy/Immunotherapy ECOG Performance Status: 0 Immunotherapy Candidate Status: Candidate for Immunotherapy PD-L1 Expression Status: PD-L1 Positive 1-49% (TPS) Intent of Therapy: Non-Curative / Palliative Intent, Not Discussed with Patient

## 2024-03-09 NOTE — Assessment & Plan Note (Addendum)
 Images and pathology results were reviewed with the patient. cT2 N2 M0, Stage III right lung adenocarcinoma.  NGS showed TPS 5% CPS 10  KRAS G12C, TMB 12.6   Labs are reviewed and discussed with patient. Proceed with Durvalumab  Q 2weeks  02/13/24 CT scan showed  soft tissue thickening in the right infrahilar region and medial right lower lobe. New extensive airspace opacification in the right perihilar region and right paramediastinal lung in the right middle lobe and right lower lobe. new airspace opacity in the right lower lobe, new spiculated nodule in the right lower lobe 03/03/2024, PET scan findings are concerning for recurrent metastatic lung cancer. She has large hypermetabolic right iliac bone lesion, right lower lobe hypermetabolic lesion and left adrenal gland lesion. Discussed with patient husband about disease recurrence.  I will obtain liquid NGS. Discussed about option of biopsy of right lower lobe lesion via bronchoscopy, patient is currently on Eliquis  for anticoagulation.  Clinically she has disease recurrence.  Shared decision was made to forego right lower lesion biopsy for now.  She will be referred to radiation oncology for palliative radiation to right iliac bone lesion and to start with systemic chemotherapy-recommend carboplatin /Alimta/Keytruda as first-line treatment of stage IV setting.  Second line treatment option consider Adagrasib

## 2024-03-10 ENCOUNTER — Telehealth: Payer: Self-pay

## 2024-03-10 ENCOUNTER — Other Ambulatory Visit: Payer: Self-pay

## 2024-03-10 DIAGNOSIS — M25551 Pain in right hip: Secondary | ICD-10-CM

## 2024-03-10 LAB — MISCELLANEOUS TEST

## 2024-03-10 NOTE — Telephone Encounter (Signed)
 Spoke with Dr. Babara regarding broch and she has communicated with Dr. Assakar. Procedure will be cancelled. Pt has been informed that procedure will be cancelled along with CT, but that she will need MRI brain.   Appt with rad onc has been moved up to 12/9 for right hip pain. All appts reviewed with pt and she verbalized understanding. Advised her to pick up updated AVS tomorrow when she is here for B12 injection. Pt verbalized understanding.

## 2024-03-10 NOTE — Telephone Encounter (Signed)
 Pt has been informed of new treatment appointments and B12 inj scheduled for tomorrow.   Pt would like to knows if she still needs to do the bronchoscopy. She was under the impression that it was going to be cancelled?

## 2024-03-10 NOTE — Telephone Encounter (Signed)
-----   Message from Zelphia Cap sent at 03/09/2024  7:24 PM EST ----- I finalized her new chemotherapy IS.  Please check with pharmacy about chemotherapy availability.  Thank you Please arrange patient to get a vitamin B12 injection this week.  zy

## 2024-03-10 NOTE — Progress Notes (Signed)
 Pharmacist Chemotherapy Monitoring - Initial Assessment    Anticipated start date: 03/17/24   The following has been reviewed per standard work regarding the patient's treatment regimen: The patient's diagnosis, treatment plan and drug doses, and organ/hematologic function Lab orders and baseline tests specific to treatment regimen  The treatment plan start date, drug sequencing, and pre-medications Prior authorization status  Patient's documented medication list, including drug-drug interaction screen and prescriptions for anti-emetics and supportive care specific to the treatment regimen The drug concentrations, fluid compatibility, administration routes, and timing of the medications to be used The patient's access for treatment and lifetime cumulative dose history, if applicable  The patient's medication allergies and previous infusion related reactions, if applicable   Changes made to treatment plan:  N/A  Follow up needed:  Pending authorization for treatment    Maudie FORBES Andreas, PharmD, BCPS Clinical Pharmacist   03/10/2024  3:39 PM

## 2024-03-11 ENCOUNTER — Inpatient Hospital Stay

## 2024-03-11 DIAGNOSIS — Z5111 Encounter for antineoplastic chemotherapy: Secondary | ICD-10-CM | POA: Diagnosis not present

## 2024-03-11 DIAGNOSIS — C3491 Malignant neoplasm of unspecified part of right bronchus or lung: Secondary | ICD-10-CM

## 2024-03-11 MED ORDER — CYANOCOBALAMIN 1000 MCG/ML IJ SOLN
1000.0000 ug | Freq: Once | INTRAMUSCULAR | Status: AC
  Administered 2024-03-11: 1000 ug via INTRAMUSCULAR
  Filled 2024-03-11: qty 1

## 2024-03-12 ENCOUNTER — Encounter: Payer: Self-pay | Admitting: Oncology

## 2024-03-12 ENCOUNTER — Other Ambulatory Visit

## 2024-03-13 ENCOUNTER — Other Ambulatory Visit: Payer: Self-pay | Admitting: Oncology

## 2024-03-16 ENCOUNTER — Encounter: Payer: Self-pay | Admitting: Oncology

## 2024-03-16 ENCOUNTER — Ambulatory Visit: Admission: RE | Admit: 2024-03-16 | Discharge: 2024-03-16 | Attending: Oncology

## 2024-03-16 ENCOUNTER — Ambulatory Visit

## 2024-03-16 DIAGNOSIS — C3491 Malignant neoplasm of unspecified part of right bronchus or lung: Secondary | ICD-10-CM

## 2024-03-16 MED ORDER — GADOBUTROL 1 MMOL/ML IV SOLN
7.0000 mL | Freq: Once | INTRAVENOUS | Status: AC | PRN
  Administered 2024-03-16: 7 mL via INTRAVENOUS

## 2024-03-17 ENCOUNTER — Ambulatory Visit: Admit: 2024-03-17 | Admitting: Pulmonary Disease

## 2024-03-17 ENCOUNTER — Other Ambulatory Visit: Payer: Self-pay | Admitting: *Deleted

## 2024-03-17 ENCOUNTER — Encounter: Payer: Self-pay | Admitting: Oncology

## 2024-03-17 ENCOUNTER — Encounter: Payer: Self-pay | Admitting: Radiation Oncology

## 2024-03-17 ENCOUNTER — Inpatient Hospital Stay: Admitting: Oncology

## 2024-03-17 ENCOUNTER — Ambulatory Visit
Admission: RE | Admit: 2024-03-17 | Discharge: 2024-03-17 | Attending: Radiation Oncology | Admitting: Radiation Oncology

## 2024-03-17 ENCOUNTER — Inpatient Hospital Stay

## 2024-03-17 VITALS — BP 101/72 | HR 108 | Resp 18

## 2024-03-17 VITALS — BP 114/68 | HR 109 | Temp 98.2°F | Resp 16 | Wt 156.0 lb

## 2024-03-17 VITALS — BP 105/73 | HR 120 | Temp 98.2°F | Resp 16 | Wt 156.0 lb

## 2024-03-17 DIAGNOSIS — R911 Solitary pulmonary nodule: Secondary | ICD-10-CM | POA: Insufficient documentation

## 2024-03-17 DIAGNOSIS — Z5112 Encounter for antineoplastic immunotherapy: Secondary | ICD-10-CM | POA: Diagnosis not present

## 2024-03-17 DIAGNOSIS — C3491 Malignant neoplasm of unspecified part of right bronchus or lung: Secondary | ICD-10-CM

## 2024-03-17 DIAGNOSIS — Z5111 Encounter for antineoplastic chemotherapy: Secondary | ICD-10-CM | POA: Diagnosis not present

## 2024-03-17 DIAGNOSIS — C3401 Malignant neoplasm of right main bronchus: Secondary | ICD-10-CM | POA: Insufficient documentation

## 2024-03-17 DIAGNOSIS — M25551 Pain in right hip: Secondary | ICD-10-CM

## 2024-03-17 DIAGNOSIS — C7931 Secondary malignant neoplasm of brain: Secondary | ICD-10-CM | POA: Insufficient documentation

## 2024-03-17 DIAGNOSIS — I2693 Single subsegmental pulmonary embolism without acute cor pulmonale: Secondary | ICD-10-CM | POA: Diagnosis not present

## 2024-03-17 DIAGNOSIS — C771 Secondary and unspecified malignant neoplasm of intrathoracic lymph nodes: Secondary | ICD-10-CM | POA: Insufficient documentation

## 2024-03-17 DIAGNOSIS — C7951 Secondary malignant neoplasm of bone: Secondary | ICD-10-CM | POA: Insufficient documentation

## 2024-03-17 LAB — CBC WITH DIFFERENTIAL (CANCER CENTER ONLY)
Abs Immature Granulocytes: 0.02 K/uL (ref 0.00–0.07)
Basophils Absolute: 0.1 K/uL (ref 0.0–0.1)
Basophils Relative: 1 %
Eosinophils Absolute: 0.3 K/uL (ref 0.0–0.5)
Eosinophils Relative: 5 %
HCT: 35 % — ABNORMAL LOW (ref 36.0–46.0)
Hemoglobin: 11.7 g/dL — ABNORMAL LOW (ref 12.0–15.0)
Immature Granulocytes: 0 %
Lymphocytes Relative: 12 %
Lymphs Abs: 0.7 K/uL (ref 0.7–4.0)
MCH: 27.5 pg (ref 26.0–34.0)
MCHC: 33.4 g/dL (ref 30.0–36.0)
MCV: 82.2 fL (ref 80.0–100.0)
Monocytes Absolute: 0.7 K/uL (ref 0.1–1.0)
Monocytes Relative: 12 %
Neutro Abs: 4.1 K/uL (ref 1.7–7.7)
Neutrophils Relative %: 70 %
Platelet Count: 253 K/uL (ref 150–400)
RBC: 4.26 MIL/uL (ref 3.87–5.11)
RDW: 13.3 % (ref 11.5–15.5)
WBC Count: 5.9 K/uL (ref 4.0–10.5)
nRBC: 0 % (ref 0.0–0.2)

## 2024-03-17 LAB — CMP (CANCER CENTER ONLY)
ALT: 39 U/L (ref 0–44)
AST: 38 U/L (ref 15–41)
Albumin: 4 g/dL (ref 3.5–5.0)
Alkaline Phosphatase: 108 U/L (ref 38–126)
Anion gap: 13 (ref 5–15)
BUN: 15 mg/dL (ref 8–23)
CO2: 23 mmol/L (ref 22–32)
Calcium: 9.8 mg/dL (ref 8.9–10.3)
Chloride: 102 mmol/L (ref 98–111)
Creatinine: 0.78 mg/dL (ref 0.44–1.00)
GFR, Estimated: >60 mL/min (ref >60)
Glucose, Bld: 98 mg/dL (ref 70–99)
Potassium: 3.7 mmol/L (ref 3.5–5.1)
Sodium: 138 mmol/L (ref 135–145)
Total Bilirubin: 0.5 mg/dL (ref 0.0–1.2)
Total Protein: 6.8 g/dL (ref 6.5–8.1)

## 2024-03-17 LAB — TSH: TSH: 0.868 u[IU]/mL (ref 0.350–4.500)

## 2024-03-17 SURGERY — VIDEO BRONCHOSCOPY WITH ENDOBRONCHIAL NAVIGATION
Anesthesia: General | Laterality: Right

## 2024-03-17 MED ORDER — SODIUM CHLORIDE 0.9 % IV SOLN
536.0000 mg | Freq: Once | INTRAVENOUS | Status: AC
  Administered 2024-03-17: 540 mg via INTRAVENOUS
  Filled 2024-03-17: qty 54

## 2024-03-17 MED ORDER — SODIUM CHLORIDE 0.9 % IV SOLN
500.0000 mg/m2 | Freq: Once | INTRAVENOUS | Status: AC
  Administered 2024-03-17: 900 mg via INTRAVENOUS
  Filled 2024-03-17: qty 36

## 2024-03-17 MED ORDER — SODIUM CHLORIDE 0.9 % IV SOLN
20.0000 mg | Freq: Once | INTRAVENOUS | Status: AC
  Administered 2024-03-17: 20 mg via INTRAVENOUS
  Filled 2024-03-17: qty 2

## 2024-03-17 MED ORDER — DEXAMETHASONE 4 MG PO TABS: 4.0000 mg | ORAL_TABLET | Freq: Three times a day (TID) | ORAL | 0 refills | Status: DC

## 2024-03-17 MED ORDER — PALONOSETRON HCL INJECTION 0.25 MG/5ML
0.2500 mg | Freq: Once | INTRAVENOUS | Status: AC
  Administered 2024-03-17: 0.25 mg via INTRAVENOUS
  Filled 2024-03-17: qty 5

## 2024-03-17 MED ORDER — APREPITANT 130 MG/18ML IV EMUL
130.0000 mg | Freq: Once | INTRAVENOUS | Status: AC
  Administered 2024-03-17: 130 mg via INTRAVENOUS
  Filled 2024-03-17: qty 18

## 2024-03-17 MED ORDER — SODIUM CHLORIDE 0.9 % IV SOLN
INTRAVENOUS | Status: DC
  Filled 2024-03-17: qty 250

## 2024-03-17 MED ORDER — PROCHLORPERAZINE MALEATE 10 MG PO TABS: 10.0000 mg | ORAL_TABLET | Freq: Four times a day (QID) | ORAL | 3 refills | Status: AC | PRN

## 2024-03-17 MED ORDER — ALPRAZOLAM 0.5 MG PO TABS: 0.5000 mg | ORAL_TABLET | Freq: Every day | ORAL | 0 refills | Status: AC | PRN

## 2024-03-17 MED ORDER — ONDANSETRON HCL 8 MG PO TABS: 8.0000 mg | ORAL_TABLET | ORAL | 3 refills | Status: AC

## 2024-03-17 NOTE — Patient Instructions (Signed)
 [facility] - A DEPT OF [facility]  Discharge Instructions: Thank you for choosing [facility] to provide your oncology and hematology care.  If you have a lab appointment with the Cancer Center, please go directly to the Cancer Center and check in at the registration area.  Wear comfortable clothing and clothing appropriate for easy access to any Portacath or PICC line.   We strive to give you quality time with your provider. You may need to reschedule your appointment if you arrive late (15 or more minutes).  Arriving late affects you and other patients whose appointments are after yours.  Also, if you miss three or more appointments without notifying the office, you may be dismissed from the clinic at the provider's discretion.      For prescription refill requests, have your pharmacy contact our office and allow 72 hours for refills to be completed.    Today you received the following chemotherapy and/or immunotherapy agents alimta /carboplatin      To help prevent nausea and vomiting after your treatment, we encourage you to take your nausea medication as directed.  BELOW ARE SYMPTOMS THAT SHOULD BE REPORTED IMMEDIATELY: *FEVER GREATER THAN 100.4 F (38 C) OR HIGHER *CHILLS OR SWEATING *NAUSEA AND VOMITING THAT IS NOT CONTROLLED WITH YOUR NAUSEA MEDICATION *UNUSUAL SHORTNESS OF BREATH *UNUSUAL BRUISING OR BLEEDING *URINARY PROBLEMS (pain or burning when urinating, or frequent urination) *BOWEL PROBLEMS (unusual diarrhea, constipation, pain near the anus) TENDERNESS IN MOUTH AND THROAT WITH OR WITHOUT PRESENCE OF ULCERS (sore throat, sores in mouth, or a toothache) UNUSUAL RASH, SWELLING OR PAIN  UNUSUAL VAGINAL DISCHARGE OR ITCHING   Items with * indicate a potential emergency and should be followed up as soon as possible or go to the Emergency Department if any problems should occur.  Please show the CHEMOTHERAPY ALERT CARD or  IMMUNOTHERAPY ALERT CARD at check-in to the Emergency Department and triage nurse.  Should you have questions after your visit or need to cancel or reschedule your appointment, please contact [facility] - A DEPT OF [facility]  [phone] and follow the prompts.  Office hours are 8:00 a.m. to 4:30 p.m. Monday - Friday. Please note that voicemails left after 4:00 p.m. may not be returned until the following business day.  We are closed weekends and major holidays. You have access to a nurse at all times for urgent questions. Please call the main number to the clinic [phone] and follow the prompts.  For any non-urgent questions, you may also contact your provider using MyChart. We now offer e-Visits for anyone 34 and older to request care online for non-urgent symptoms. For details visit [email or web address].   Also download the MyChart app! Go to the app store, search MyChart, open the app, select [facility], and log in with your MyChart username and password.  Pemetrexed  Injection What is this medication? PEMETREXED  (PEM e TREX ed) treats some types of cancer. It works by slowing down the growth of cancer cells. This medicine may be used for other purposes; ask your health care provider or pharmacist if you have questions. COMMON BRAND NAME(S): Alimta , PEMFEXY, PEMRYDI RTU What should I tell my care team before I take this medication? They need to know if you have any of these conditions: Infection, such as chickenpox, cold sores, or herpes Kidney disease Low blood cell levels (white cells, red cells, and platelets) Lung or breathing disease,  such as asthma Radiation therapy An unusual or allergic reaction to pemetrexed , other medications, foods, dyes, or preservatives If you or your partner are pregnant or trying to get pregnant Breast-feeding How should I use this medication? This medication is injected into a vein. It is given by your care  team in a hospital or clinic setting. Talk to your care team about the use of this medication in children. Special care may be needed. Overdosage: If you think you have taken too much of this medicine contact a poison control center or emergency room at once. NOTE: This medicine is only for you. Do not share this medicine with others. What if I miss a dose? Keep appointments for follow-up doses. It is important not to miss your dose. Call your care team if you are unable to keep an appointment. What may interact with this medication? Do not take this medication with any of the following: Live virus vaccines This medication may also interact with the following: Ibuprofen This list may not describe all possible interactions. Give your health care provider a list of all the medicines, herbs, non-prescription drugs, or dietary supplements you use. Also tell them if you smoke, drink alcohol, or use illegal drugs. Some items may interact with your medicine. What should I watch for while using this medication? Your condition will be monitored carefully while you are receiving this medication. This medication may make you feel generally unwell. This is not uncommon as chemotherapy can affect healthy cells as well as cancer cells. Report any side effects. Continue your course of treatment even though you feel ill unless your care team tells you to stop. This medication can cause serious side effects. To reduce the risk, your care team may give you other medications to take before receiving this one. Be sure to follow the directions from your care team. This medication can cause a rash or redness in areas of the body that have previously had radiation therapy. If you have had radiation therapy, tell your care team if you notice a rash in this area. This medication may increase your risk of getting an infection. Call your care team for advice if you get a fever, chills, sore throat, or other symptoms of a cold  or flu. Do not treat yourself. Try to avoid being around people who are sick. Be careful brushing or flossing your teeth or using a toothpick because you may get an infection or bleed more easily. If you have any dental work done, tell your dentist you are receiving this medication. Avoid taking medications that contain aspirin, acetaminophen , ibuprofen, naproxen, or ketoprofen unless instructed by your care team. These medications may hide a fever. Check with your care team if you have severe diarrhea, nausea, and vomiting, or if you sweat a lot. The loss of too much body fluid may make it dangerous for you to take this medication. Talk to your care team if you or your partner wish to become pregnant or think either of you might be pregnant. This medication can cause serious birth defects if taken during pregnancy and for 6 months after the last dose. A negative pregnancy test is required before starting this medication. A reliable form of contraception is recommended while taking this medication and for 6 months after the last dose. Talk to your care team about reliable forms of contraception. Do not father a child while taking this medication and for 3 months after the last dose. Use a condom while having sex during  this time period. Do not breastfeed while taking this medication and for 1 week after the last dose. This medication may cause infertility. Talk to your care team if you are concerned about your fertility. What side effects may I notice from receiving this medication? Side effects that you should report to your care team as soon as possible: Allergic reactions--skin rash, itching, hives, swelling of the face, lips, tongue, or throat Dry cough, shortness of breath or trouble breathing Infection--fever, chills, cough, sore throat, wounds that don't heal, pain or trouble when passing urine, general feeling of discomfort or being unwell Kidney injury--decrease in the amount of urine, swelling  of the ankles, hands, or feet Low red blood cell level--unusual weakness or fatigue, dizziness, headache, trouble breathing Redness, blistering, peeling, or loosening of the skin, including inside the mouth Unusual bruising or bleeding Side effects that usually do not require medical attention (report to your care team if they continue or are bothersome): Fatigue Loss of appetite Nausea Vomiting This list may not describe all possible side effects. Call your doctor for medical advice about side effects. You may report side effects to FDA at 1-800-FDA-1088. Where should I keep my medication? This medication is given in a hospital or clinic. It will not be stored at home. NOTE: This sheet is a summary. It may not cover all possible information. If you have questions about this medicine, talk to your doctor, pharmacist, or health care provider.  2024 Elsevier/Gold Standard (2021-08-01 00:00:00) Carboplatin  Injection What is this medication? CARBOPLATIN  (KAR boe pla tin) treats some types of cancer. It works by slowing down the growth of cancer cells. This medicine may be used for other purposes; ask your health care provider or pharmacist if you have questions. COMMON BRAND NAME(S): Paraplatin  What should I tell my care team before I take this medication? They need to know if you have any of these conditions: Blood disorders Hearing problems Kidney disease Recent or ongoing radiation therapy An unusual or allergic reaction to carboplatin , cisplatin, other medications, foods, dyes, or preservatives Pregnant or trying to get pregnant Breast-feeding How should I use this medication? This medication is injected into a vein. It is given by your care team in a hospital or clinic setting. Talk to your care team about the use of this medication in children. Special care may be needed. Overdosage: If you think you have taken too much of this medicine contact a poison control center or emergency  room at once. NOTE: This medicine is only for you. Do not share this medicine with others. What if I miss a dose? Keep appointments for follow-up doses. It is important not to miss your dose. Call your care team if you are unable to keep an appointment. What may interact with this medication? Medications for seizures Some antibiotics, such as amikacin, gentamicin, neomycin, streptomycin, tobramycin Vaccines This list may not describe all possible interactions. Give your health care provider a list of all the medicines, herbs, non-prescription drugs, or dietary supplements you use. Also tell them if you smoke, drink alcohol, or use illegal drugs. Some items may interact with your medicine. What should I watch for while using this medication? Your condition will be monitored carefully while you are receiving this medication. You may need blood work while taking this medication. This medication may make you feel generally unwell. This is not uncommon, as chemotherapy can affect healthy cells as well as cancer cells. Report any side effects. Continue your course of treatment even though  you feel ill unless your care team tells you to stop. In some cases, you may be given additional medications to help with side effects. Follow all directions for their use. This medication may increase your risk of getting an infection. Call your care team for advice if you get a fever, chills, sore throat, or other symptoms of a cold or flu. Do not treat yourself. Try to avoid being around people who are sick. Avoid taking medications that contain aspirin, acetaminophen , ibuprofen, naproxen, or ketoprofen unless instructed by your care team. These medications may hide a fever. Be careful brushing or flossing your teeth or using a toothpick because you may get an infection or bleed more easily. If you have any dental work done, tell your dentist you are receiving this medication. Talk to your care team if you wish to  become pregnant or think you might be pregnant. This medication can cause serious birth defects. Talk to your care team about effective forms of contraception. Do not breast-feed while taking this medication. What side effects may I notice from receiving this medication? Side effects that you should report to your care team as soon as possible: Allergic reactions--skin rash, itching, hives, swelling of the face, lips, tongue, or throat Infection--fever, chills, cough, sore throat, wounds that don't heal, pain or trouble when passing urine, general feeling of discomfort or being unwell Low red blood cell level--unusual weakness or fatigue, dizziness, headache, trouble breathing Pain, tingling, or numbness in the hands or feet, muscle weakness, change in vision, confusion or trouble speaking, loss of balance or coordination, trouble walking, seizures Unusual bruising or bleeding Side effects that usually do not require medical attention (report to your care team if they continue or are bothersome): Hair loss Nausea Unusual weakness or fatigue Vomiting This list may not describe all possible side effects. Call your doctor for medical advice about side effects. You may report side effects to FDA at 1-800-FDA-1088. Where should I keep my medication? This medication is given in a hospital or clinic. It will not be stored at home. NOTE: This sheet is a summary. It may not cover all possible information. If you have questions about this medicine, talk to your doctor, pharmacist, or health care provider.  2024 Elsevier/Gold Standard (2021-07-18 00:00:00)

## 2024-03-17 NOTE — Assessment & Plan Note (Signed)
 Images and pathology results were reviewed with the patient. cT2 N2 M0, Stage III right lung adenocarcinoma.  NGS showed TPS 5% CPS 10  KRAS G12C, TMB 12.6   Labs are reviewed and discussed with patient. Proceed with Durvalumab  Q 2weeks  02/13/24 CT scan showed  soft tissue thickening in the right infrahilar region and medial right lower lobe. New extensive airspace opacification in the right perihilar region and right paramediastinal lung in the right middle lobe and right lower lobe. new airspace opacity in the right lower lobe, new spiculated nodule in the right lower lobe 03/03/2024, PET scan findings are concerning for recurrent metastatic lung cancer. She has large hypermetabolic right iliac bone lesion, right lower lobe hypermetabolic lesion and left adrenal gland lesion.- obtain liquid NGS result is pending.. Recommend first-line treatment carboplatin /Alimta /Keytruda as first-line treatment of stage IV setting.  Second line treatment option consider Adagrasib  Labs reviewed and discussed with patient.  Proceed with cycle 1 carboplatin  and Alimta .  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

## 2024-03-17 NOTE — Assessment & Plan Note (Signed)
 Pain is secondary to large right iliac bone mass.  Refer to radiation oncology for palliative radiation.  Continue oxycodone  5 mg every 4-6 hours as needed for pain

## 2024-03-17 NOTE — Progress Notes (Signed)
 Hematology/Oncology Progress note [phone] [phone]       CHIEF COMPLAINTS/PURPOSE OF CONSULTATION:  Stage III lung adenocarcinoam  ASSESSMENT & PLAN:   Cancer Staging  Primary lung adenocarcinoma, right [facility]) Staging form: Lung, AJCC V9 - Clinical stage from 08/15/2023: Tanya Howell, cM0 - Signed by Babara Call, MD on 08/15/2023 - Clinical stage from 03/09/2024: [name] - Signed by Babara Call, MD on 03/09/2024   Primary lung adenocarcinoma, right [facility]) Images and pathology results were reviewed with the patient. cT2 N2 M0, Stage III right lung adenocarcinoma.  NGS showed TPS 5% CPS 10  KRAS G12C, TMB 12.6   Labs are reviewed and discussed with patient. Proceed with Durvalumab  Q 2weeks  02/13/24 CT scan showed  soft tissue thickening in the right infrahilar region and medial right lower lobe. New extensive airspace opacification in the right perihilar region and right paramediastinal lung in the right middle lobe and right lower lobe. new airspace opacity in the right lower lobe, new spiculated nodule in the right lower lobe 03/03/2024, PET scan findings are concerning for recurrent metastatic lung cancer. She has large hypermetabolic right iliac bone lesion, right lower lobe hypermetabolic lesion and left adrenal gland lesion.- obtain liquid NGS result is pending.. Recommend first-line treatment carboplatin /Alimta /Keytruda as first-line treatment of stage IV setting.  Second line treatment option consider Adagrasib  Labs reviewed and discussed with patient.  Proceed with cycle 1 carboplatin  and Alimta .  Hold Keytruda due to development of brain metastasis/edema, being on steroids  Encounter for antineoplastic immunotherapy Treatment plan as listed above  Pulmonary embolism (HCC) Anticoagulation with Eliquis  5mg  BID   Right hip pain Pain is secondary to large right iliac bone mass.  Refer to radiation oncology for palliative radiation.  Continue oxycodone  5  mg every 4-6 hours as needed for pain  Metastasis to brain [facility]) MRI results were reviewed with patient. Patient has mild edema surrounding right frontal lobe lesion. She will get dexamethasone  20 mg today, recommend patient to start dexamethasone  4 mg 3 times daily tomorrow. I have discussed with Dr. Chrystal who will offer patient brain radiation.  Hold off Keytruda      Orders Placed This Encounter  Procedures   CBC with Differential (Cancer Center Only)    Standing Status:   Future    Expected Date:   04/07/2024    Expiration Date:   04/07/2025   CMP (Cancer Center only)    Standing Status:   Future    Expected Date:   04/07/2024    Expiration Date:   04/07/2025   CBC with Differential (Cancer Center Only)    Standing Status:   Future    Expected Date:   03/24/2024    Expiration Date:   06/22/2024   CMP (Cancer Center only)    Standing Status:   Future    Expected Date:   03/24/2024    Expiration Date:   06/22/2024   Follow-up 1 week All questions were answered. The patient knows to call the clinic with any problems, questions or concerns.  Call Babara, MD, PhD [facility] Hematology Oncology 03/17/2024    HISTORY OF PRESENTING ILLNESS:  Tanya Howell 63 y.o. female presents to establish care for lung cancer  Oncology History  Primary lung adenocarcinoma, right (HCC)  08/03/2023 Imaging   CT angiogram chest PE protocol showed  1. 3.5 x 2.6 cm right hilar mass, with obstruction of the bronchus intermedius, right middle lobe bronchus, and portions of the right  lower lobe bronchus as above, highly concerning for neoplasm. Bronchoscopy is recommended for further evaluation. 2. Subcarinal adenopathy concerning for metastatic disease. 3. Dense medial segment consolidation within the right middle lobe with associated volume loss, consistent with atelectasis. 4. Prominent right lower lobe bronchiectasis, with fluid-filled bronchi and patchy right basilar consolidation  consistent with combination of airspace disease and atelectasis. 5. No evidence of pulmonary embolus. 6. Aortic Atherosclerosis (ICD10-I70.0). Coronary artery atherosclerosis.   08/06/2023 Initial Diagnosis   Primary lung adenocarcinoma, right (HCC)  08/03/2023, patient presented to emergency room for evaluation of hemoptysis. Patient had pneumonia in March 2025, she continues to have a lingering cough for the past months despite being treated with doxycycline  and prednisone . She coughed up small amount of bright red blood on 08/03/2023.  Denies any shortness of breath, chest pain unintentional weight loss headache.  She felt feverish with chills 2 nights ago. Patient is a former smoker, quit smoking in 2011. Patient denies being on any antiplatelet or anticoagulation agents.  08/08/2023 She underwent biopsy via bronchoscopy.  FINAL MICROSCOPIC DIAGNOSIS:  A. LUNG, RLL, BRUSHING:  - Adenocarcinoma   B. LUNG, RLL, FINE NEEDLE ASPIRATION:  - Adenocarcinoma   Immunohistochemical stains were performed to characterize the tumor cells. The cells are negative for TTF-1, Napsin A, p40, synaptophysin, Chromogranin A, and CD56.  Mucicarmine stain highlights intracytoplasmic mucin. Ki67 proliferation index is approximately 30%. The overall findings are supportive of the diagnosis of adenocarcinoma.   C. LYMPH NODE, 7, FINE NEEDLE ASPIRATION:  - Adenocarcinoma  - Lymphoid tissue present   D. LYMPH NODE, 4R, FINE NEEDLE ASPIRATION:  - No malignant cells identified  - Lymphoid tissue present   E. LYMPH NODE, 11R, FINE NEEDLE ASPIRATION:  - No malignant cells identified  - Lymphoid tissue present   F. LUNG, RLL, LAVAGE:  FINAL MICROSCOPIC DIAGNOSIS:  - Atypical cells present    08/15/2023 Cancer Staging   Staging form: Lung, AJCC V9 - Clinical stage from 08/15/2023: Tanya Howell, cM0 - Signed by Babara Call, MD on 08/15/2023 Stage prefix: Initial diagnosis Method of lymph node assessment: Clinical    09/09/2023 - 10/21/2023 Chemotherapy   Patient is on Treatment Plan : LUNG Carboplatin  + Paclitaxel  + XRT q7d     11/22/2023 Imaging   CT chest with contrast   1. Diminished size subcarinal and right hilar node or mass, measuring 1.5 x 0.9 cm, previously 3.4 x 3.2 cm. Findings are consistent with treatment response. 2. No evidence of new or progressive disease in the chest. 3. Coronary artery disease   11/29/2023 - 02/21/2024 Chemotherapy   Patient is on Treatment Plan : LUNG Durvalumab  (10) q14d     02/17/2024 Imaging   CT chest w contrast  1. Significant interval change in appearance of the right lung as detailed above. This certainly could reflect radiation pneumonitis if the patient has received any new RT. Pneumonia or aspiration would be another consideration. I believe it is unlikely that this is recurrent tumor given the fact that the last CT showed a very good response to treatment and given its rapid onset and extensive appearance. However, that does remain a possibility. Recommend correlation with clinical findings of pneumonia and appropriate treatment. A short-term follow-up chest CT in 2-3 months may be helpful to reassess this process. 2. 12 mm spiculated appearing nodule in the right lower lobe laterally could be part of the same process. 3. No findings for her metastatic disease involving the upper abdomen or bony structures.  03/03/2024 Imaging   PET scan showed 1. New large lytic bone lesion with overlying cortical destruction in the right iliac bone measuring 5.0 x 5.3 cm with SUV max of 43.3, compatible with osseous metastasis. 2. New tracer-avid nodule within the left adrenal gland measuring 0.9 cm with SUV max of 10.1, suspicious for metastatic disease. 3. Tracer-avid right lower lobe pulmonary nodule measuring 1.1 cm with SUV max of 9.5, increased from 0.8 cm on 11/22/2023 and slightly decreased from 1.3 cm on 02/13/2024, suspicious for active malignancy. 4.  Interval resolution of the previous tracer avid right hilar mass and adjacent subcarinal hypermetabolic lymph node. 5. Extensive right perihilar and paramediastinal fibrotic change compatible with post-radiation effect, with increased tracer uptake reflecting treatment-related inflammation.   03/09/2024 Cancer Staging   Staging form: Lung, AJCC V9 - Clinical stage from 03/09/2024: Matt Banker, rcM1 - Signed by Babara Call, MD on 03/09/2024 Stage prefix: Recurrence Method of lymph node assessment: Clinical   03/16/2024 Imaging   MRI brain with and without contrast showed 1. New 1 cm enhancing lesion in the right frontal lobe with mild edema most compatible with a metastasis. 2. Mild chronic small vessel ischemic disease   03/17/2024 -  Chemotherapy   Patient is on Treatment Plan : LUNG Carboplatin  (5) + Pemetrexed  (500) + Pembrolizumab (200) D1 q21d Induction x 4 cycles / Maintenance Pemetrexed  (500) + Pembrolizumab (200) D1 q21d      09/23/2023 diagnosis of Segmental pulmonary emboli in the lingula. She takes Eliquis  5mg  BID She denies any cough, SOB chest pain, diarrhea.  Patient denies headache, focal weakness. Patient presents for evaluation of chemotherapy.  No new complaints.  Had MRI done yesterday.   MEDICAL HISTORY:  Past Medical History:  Diagnosis Date   COVID-19 07/2018   Hyperlipidemia    Hypertension    Lung cancer (HCC) 07/2023   Motion sickness    amuesment park rides   Osteopenia 11/01/2015   Pneumonia    april 2025   Psoriasis    Vertigo    none for over 5 yrs   Wears dentures    full upper and lower    SURGICAL HISTORY: Past Surgical History:  Procedure Laterality Date   BRONCHIAL BIOPSY  08/08/2023   Procedure: BRONCHOSCOPY, WITH BIOPSY;  Surgeon: Malka Domino, MD;  Location: [facility] ENDOSCOPY;  Service: Pulmonary;;   BRONCHIAL BRUSHINGS  08/08/2023   Procedure: BRONCHOSCOPY, WITH BRUSH BIOPSY;  Surgeon: Malka Domino, MD;  Location: [facility] ENDOSCOPY;   Service: Pulmonary;;   BRONCHIAL NEEDLE ASPIRATION BIOPSY  08/08/2023   Procedure: BRONCHOSCOPY, WITH NEEDLE ASPIRATION BIOPSY;  Surgeon: Malka Domino, MD;  Location: [facility] ENDOSCOPY;  Service: Pulmonary;;   CHOLECYSTECTOMY  1990   COLONOSCOPY WITH PROPOFOL  N/A 07/15/2020   Procedure: COLONOSCOPY WITH PROPOFOL ;  Surgeon: Jinny Carmine, MD;  Location[facility];  Service: Endoscopy;  Laterality: N/A;  priority 4   ENDOBRONCHIAL ULTRASOUND Bilateral 08/08/2023   Procedure: ENDOBRONCHIAL ULTRASOUND (EBUS);  Surgeon: Malka Domino, MD;  Location: [facility] ENDOSCOPY;  Service: Pulmonary;  Laterality: Bilateral;   IR IMAGING GUIDED PORT INSERTION  08/29/2023   TUBAL LIGATION      SOCIAL HISTORY: Social History   Socioeconomic History   Marital status: Married    Spouse name: Oneil   Number of children: 1   Years of education: Not on file   Highest education level: High school graduate  Occupational History   Not on file  Tobacco Use   Smoking status: Former    Current packs/day:  0.00    Average packs/day: 1 pack/day for 28.0 years (28.0 ttl pk-yrs)    Types: Cigarettes    Start date: 63    Quit date: 2011    Years since quitting: 14.9   Smokeless tobacco: Never   Tobacco comments:    Started smoking around 63 yrs old.    Smoked 1PPD at her heaviest.    Quit smoking in 2011- leda 08/07/2023  Vaping Use   Vaping status: Former  Substance and Sexual Activity   Alcohol use: No    Alcohol/week: 0.0 standard drinks of alcohol    Comment: rare   Drug use: No   Sexual activity: Yes    Birth control/protection: Surgical  Other Topics Concern   Not on file  Social History Narrative   Not on file   Social Drivers of Health   Financial Resource Strain: Low Risk  (05/06/2018)   Overall Financial Resource Strain (CARDIA)    Difficulty of Paying Living Expenses: Not hard at all  Food Insecurity: No Food Insecurity (09/24/2023)   Hunger Vital Sign    Worried About Running Out  of Food in the Last Year: Never true    Ran Out of Food in the Last Year: Never true  Transportation Needs: No Transportation Needs (09/24/2023)   PRAPARE - Administrator, Civil Service (Medical): No    Lack of Transportation (Non-Medical): No  Physical Activity: Insufficiently Active (02/11/2024)   Exercise Vital Sign    Days of Exercise per Week: 4 days    Minutes of Exercise per Session: 30 min  Stress: No Stress Concern Present (02/11/2024)   [employer] of Occupational Health - Occupational Stress Questionnaire    Feeling of Stress: Only a little  Social Connections: Moderately Isolated (09/24/2023)   Social Connection and Isolation Panel    Frequency of Communication with Friends and Family: More than three times a week    Frequency of Social Gatherings with Friends and Family: Twice a week    Attends Religious Services: Never    Database Administrator or Organizations: No    Attends Banker Meetings: Never    Marital Status: Married  Catering Manager Violence: Not At Risk (09/24/2023)   Humiliation, Afraid, Rape, and Kick questionnaire    Fear of Current or Ex-Partner: No    Emotionally Abused: No    Physically Abused: No    Sexually Abused: No    FAMILY HISTORY: Family History  Problem Relation Age of Onset   Colon cancer Brother        66    Breast cancer Mother    Breast cancer Maternal Aunt    Pneumonia Father    Diabetes Maternal Grandmother     ALLERGIES:  is allergic to lisinopril .  MEDICATIONS:  Current Outpatient Medications  Medication Sig Dispense Refill   ALPRAZolam  (XANAX ) 0.5 MG tablet Take 1 tablet (0.5 mg total) by mouth daily as needed for anxiety. 30 tablet 0   atorvastatin  (LIPITOR) 10 MG tablet Take 1 tablet (10 mg total) by mouth at bedtime. 90 tablet 0   cyclobenzaprine  (FLEXERIL ) 10 MG tablet Take 1 tablet (10 mg total) by mouth at bedtime. 30 tablet 0   dexamethasone  (DECADRON ) 4 MG tablet Take 1 tablet (4 mg  total) by mouth 3 (three) times daily. 90 tablet 0   ELIQUIS  5 MG TABS tablet Take 1 tablet by mouth twice daily 60 tablet 0   folic acid  (FOLVITE ) 1 MG tablet  Take 1 tablet (1 mg total) by mouth daily. Start 7 days before pemetrexed  chemotherapy. Continue until 21 days after pemetrexed  completed. 100 tablet 3   levalbuterol  (XOPENEX  HFA) 45 MCG/ACT inhaler Inhale 2 puffs into the lungs every 6 (six) hours as needed for wheezing. 15 g 3   lidocaine -prilocaine  (EMLA ) cream Apply to affected area once 30 g 3   losartan  (COZAAR ) 25 MG tablet Take 1 tablet (25 mg total) by mouth daily. 45 tablet 0   meclizine  (ANTIVERT ) 25 MG tablet Take 1 tablet (25 mg total) by mouth 3 (three) times daily as needed for dizziness. 30 tablet 3   ondansetron  (ZOFRAN ) 8 MG tablet Take 1 tablet (8 mg total) by mouth See admin instructions. Start after 72 hours after carboplatin  60 tablet 3   oxyCODONE  (OXY IR/ROXICODONE ) 5 MG immediate release tablet Take 1 tablet (5 mg total) by mouth every 4 (four) hours as needed for severe pain (pain score 7-10). 45 tablet 0   prochlorperazine  (COMPAZINE ) 10 MG tablet Take 1 tablet (10 mg total) by mouth every 6 (six) hours as needed for nausea or vomiting. 60 tablet 3   triamcinolone  ointment (KENALOG ) 0.5 % Apply 1 Application topically 2 (two) times daily. 30 g 6   No current facility-administered medications for this visit.    Review of Systems  Constitutional:  Negative for appetite change, chills, fatigue and fever.  HENT:   Negative for hearing loss and voice change.   Eyes:  Negative for eye problems.  Respiratory:  Negative for chest tightness, cough and hemoptysis.   Cardiovascular:  Negative for chest pain.  Gastrointestinal:  Negative for abdominal distention, abdominal pain and blood in stool.  Endocrine: Negative for hot flashes.  Genitourinary:  Negative for difficulty urinating and frequency.   Musculoskeletal:  Negative for arthralgias.       Right sciatic pain   Skin:  Negative for itching and rash.  Neurological:  Negative for extremity weakness.  Hematological:  Negative for adenopathy.  Psychiatric/Behavioral:  Negative for confusion.      PHYSICAL EXAMINATION: ECOG PERFORMANCE STATUS: 0 - Asymptomatic  Vitals:   03/17/24 1116  BP: 114/68  Pulse: (!) 109  Resp: 16  Temp: 98.2 F (36.8 C)   Filed Weights   03/17/24 1116  Weight: 156 lb (70.8 kg)    Physical Exam Constitutional:      General: She is not in acute distress.    Appearance: She is not diaphoretic.  HENT:     Head: Normocephalic and atraumatic.  Eyes:     General: No scleral icterus. Cardiovascular:     Rate and Rhythm: Normal rate and regular rhythm.     Heart sounds: No murmur heard. Pulmonary:     Effort: Pulmonary effort is normal. No respiratory distress.     Breath sounds: No wheezing.  Abdominal:     General: There is no distension.     Palpations: Abdomen is soft.     Tenderness: There is no abdominal tenderness.  Musculoskeletal:        General: Normal range of motion.     Cervical back: Normal range of motion and neck supple.  Skin:    General: Skin is warm and dry.     Findings: No erythema.  Neurological:     Mental Status: She is alert and oriented to person, place, and time. Mental status is at baseline.     Motor: No abnormal muscle tone.  Psychiatric:  Mood and Affect: Affect normal.      LABORATORY DATA:  I have reviewed the data as listed    Latest Ref Rng & Units 03/17/2024   10:58 AM 03/09/2024    8:16 AM 02/21/2024    8:51 AM  CBC  WBC 4.0 - 10.5 K/uL 5.9  6.1  6.1   Hemoglobin 12.0 - 15.0 g/dL 88.2  88.5  87.7   Hematocrit 36.0 - 46.0 % 35.0  34.5  36.9   Platelets 150 - 400 K/uL 253  212  243       Latest Ref Rng & Units 03/17/2024   10:58 AM 03/09/2024    8:16 AM 02/21/2024    8:51 AM  CMP  Glucose 70 - 99 mg/dL 98  84  96   BUN 8 - 23 mg/dL 15  14  14    Creatinine 0.44 - 1.00 mg/dL 9.21  9.33  9.21   Sodium  135 - 145 mmol/L 138  139  138   Potassium 3.5 - 5.1 mmol/L 3.7  3.7  3.6   Chloride 98 - 111 mmol/L 102  103  105   CO2 22 - 32 mmol/L 23  25  24    Calcium  8.9 - 10.3 mg/dL 9.8  9.4  9.4   Total Protein 6.5 - 8.1 g/dL 6.8  6.4  7.0   Total Bilirubin 0.0 - 1.2 mg/dL 0.5  0.3  0.4   Alkaline Phos 38 - 126 U/L 108  95  75   AST 15 - 41 U/L 38  17  22   ALT 0 - 44 U/L 39  24  26      RADIOGRAPHIC STUDIES: I have personally reviewed the radiological images as listed and agreed with the findings in the report. MR Brain W Wo Contrast Result Date: 03/16/2024 EXAM: MRI BRAIN WITH AND WITHOUT CONTRAST 03/16/2024 08:33:37 AM TECHNIQUE: Multiplanar multisequence MRI of the head/brain was performed with and without the administration of intravenous contrast. COMPARISON: MR Head 08/13/2023. CLINICAL HISTORY: Lung cancer. FINDINGS: BRAIN AND VENTRICLES: No acute infarct, intracranial hemorrhage, midline shift, hydrocephalus, or extra-axial fluid collection is identified. Scattered small T2 hyperintensities in the cerebral white matter bilaterally are unchanged and nonspecific but compatible with mild chronic small vessel ischemic disease. Cerebral volume is within normal limits for age. A new solidly enhancing mass in the medial right frontal lobe measures 1.0 cm with associated mild vasogenic edema (series 18 image 118). No second enhancing brain lesion is identified. Major intracranial vascular flow voids are preserved. ORBITS: No acute abnormality. SINUSES: No acute abnormality. BONES AND SOFT TISSUES: Normal bone marrow signal and enhancement. No acute soft tissue abnormality. IMPRESSION: 1. New 1 cm enhancing lesion in the right frontal lobe with mild edema most compatible with a metastasis. 2. Mild chronic small vessel ischemic disease. Electronically signed by: Dasie Hamburg MD 03/16/2024 04:39 PM EST RP Workstation: [id number]   NM PET Image Restag (PS) Skull Base To Thigh Result Date: 03/09/2024 EXAM:  PET AND CT SKULL BASE TO MID THIGH 03/03/2024 11:39:26 AM TECHNIQUE: RADIOPHARMACEUTICAL: 8.79 mCi F-18 FDG Uptake time 60 minutes. Glucose level 76 mg/dl. PET imaging was acquired from the base of the skull to the mid thighs. Non-contrast enhanced computed tomography was obtained for attenuation correction and anatomic localization. COMPARISON: CT chest 02/13/2024 and PET/CT from 08/09/2023. CLINICAL HISTORY: lung cancer FINDINGS: HEAD AND NECK: No metabolically active cervical lymphadenopathy. CHEST: Extensive paramediastinal perihilar and paramediastinal fibrotic change involving the right middle, right  upper, and right lower lobes compatible with changes due to external beam radiation. Increased tracer uptake throughout this area reflecting post-radiation inflammatory change. There has been interval resolution of the previous tracer avid right hilar mass and adjacent subcarinal hypermetabolic lymph node. Tracer avid nodule within the right lower lobe measures 1.1 cm with an SUV max of 9.5, image 59 of the axial images. On the CT of the chest from 02/13/2024 this nodule measured 1.3 cm. On the CT from 11/22/2023 this nodule measured 0.8 cm. No metabolically active lymphadenopathy. Coronary artery calcifications. Mild cardiac enlargement. Trace pericardial effusion is new from the previous exam, image 60/6. ABDOMEN AND PELVIS: Within the left adrenal gland, there is a tracer-avid nodule measuring 0.9 mm with SUV max of 10.1, axial image 78. This is new when compared with the previous PET/CT from 08/09/2023. Normal appearance of the right adrenal gland. Status post cholecystectomy. Aortic atherosclerotic calcifications. Punctate stone noted within the upper pole of the right kidney. Physiologic activity within the gastrointestinal and genitourinary systems. No metabolically active intraperitoneal mass. No metabolically active lymphadenopathy. BONES AND SOFT TISSUE: Within the right iliac bone there is a new large  lytic bone lesion with overlying cortical destruction measuring 5.0 x 5.3 cm with SUV max of 43.3, axial image 128. IMPRESSION: 1. New large lytic bone lesion with overlying cortical destruction in the right iliac bone measuring 5.0 x 5.3 cm with SUV max of 43.3, compatible with osseous metastasis. 2. New tracer-avid nodule within the left adrenal gland measuring 0.9 cm with SUV max of 10.1, suspicious for metastatic disease. 3. Tracer-avid right lower lobe pulmonary nodule measuring 1.1 cm with SUV max of 9.5, increased from 0.8 cm on 11/22/2023 and slightly decreased from 1.3 cm on 02/13/2024, suspicious for active malignancy. 4. Interval resolution of the previous tracer avid right hilar mass and adjacent subcarinal hypermetabolic lymph node. 5. Extensive right perihilar and paramediastinal fibrotic change compatible with post-radiation effect, with increased tracer uptake reflecting treatment-related inflammation. Electronically signed by: Waddell Calk MD 03/09/2024 08:41 AM EST RP Workstation: [id number]   DG Hip Unilat With Pelvis 2-3 Views Right Result Date: 02/26/2024 CLINICAL DATA:  Right hip pain 2 months.  No injury. EXAM: DG HIP (WITH OR WITHOUT PELVIS) 2-3V RIGHT COMPARISON:  None Available. FINDINGS: Bones, joint spaces and soft tissues over the hips are normal without fracture or dislocation. No focal lytic or sclerotic lesion over the hips. Minimal degenerate change over the spine, sacroiliac joints and symphysis pubis joint. IMPRESSION: 1. No acute findings. 2. Minimal degenerative changes as described. Electronically Signed   By: Toribio Agreste M.D.   On: 02/26/2024 12:47

## 2024-03-17 NOTE — Assessment & Plan Note (Signed)
?   Anticoagulation with Eliquis 5 mg BID

## 2024-03-17 NOTE — Progress Notes (Signed)
 Radiation Oncology Follow up Note  Name: Tanya Howell   Date:   03/17/2024 MRN:  [id number] DOB: 1960-07-12    This 63 y.o. 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  REFERRING PROVIDER: Vicci Duwaine SQUIBB, DO  HPI: Patient is a 63 year old female completed concurrent chemoradiation therapy for stage IIIa adenocarcinoma of the right lung..  Patient had been on Durvalumab  although recent CT scan shows soft tissue tissue thickening the right infrahilar region and extensive air pastelike opacification the right perihilar region and right paramediastinal lung and a new spiculated nodule in the right lower lobe.  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  She is starting carboplatinum Alimta  and Keytruda under medical oncology's direction.  I been asked to evaluate her for right hip palliation.  She is ambulating with difficulty with a cane.  She is also on steroids as well as oxycodone .  COMPLICATIONS OF TREATMENT: none  FOLLOW UP COMPLIANCE: keeps appointments   PHYSICAL EXAM:  BP 105/73   Pulse (!) 120   Temp 98.2 F (36.8 C) (Tympanic)   Resp 16   Wt 156 lb (70.8 kg)   BMI 26.78 kg/m  There is point pain on palpation of the right hip.  Motor and sensory levels in the lower extremities are equal and symmetric.  Well-developed well-nourished patient in NAD. HEENT reveals PERLA, EOMI, discs not visualized.  Oral cavity is clear. No oral mucosal lesions are identified. Neck is clear without evidence of cervical or supraclavicular adenopathy. Lungs are clear to A&P. Cardiac examination is essentially unremarkable with regular rate and rhythm without murmur rub or thrill. Abdomen is benign  with no organomegaly or masses noted. Motor sensory and DTR levels are equal and symmetric in the upper and lower extremities. Cranial nerves II through XII are grossly intact. Proprioception is intact. No peripheral adenopathy or edema is identified. No motor or sensory levels are noted. Crude visual fields are within normal range.  RADIOLOGY RESULTS: PET scan reviewed compatible with above-stated findings  PLAN: At the present time I have recommended palliative radiation therapy to her right hip.  Will plan on delivering 30 Gray in 10 fractions.  Risks and benefits of treatment including skin reaction fatigue alteration blood counts were reviewed with the patient.  I will treat this to avoid any unnecessary bowel toxicity.  I have personally set up and ordered CT simulation for tomorrow.  Patient comprehends my recommendations well.  I would like to take this opportunity to thank you for allowing me to participate in the care of your patient.Tanya [name], MD

## 2024-03-17 NOTE — Progress Notes (Signed)
 Pt in for follow up, seen rad onc by Dr Lenn for reconsult this morning.

## 2024-03-17 NOTE — Assessment & Plan Note (Signed)
 Treatment plan as listed above.

## 2024-03-17 NOTE — Assessment & Plan Note (Signed)
 MRI results were reviewed with patient. Patient has mild edema surrounding right frontal lobe lesion. She will get dexamethasone  20 mg today, recommend patient to start dexamethasone  4 mg 3 times daily tomorrow. I have discussed with Dr. Chrystal who will offer patient brain radiation.  Hold off Keytruda

## 2024-03-18 ENCOUNTER — Other Ambulatory Visit: Payer: Self-pay | Admitting: *Deleted

## 2024-03-18 ENCOUNTER — Ambulatory Visit
Admission: RE | Admit: 2024-03-18 | Discharge: 2024-03-18 | Disposition: A | Discharge status: Home / Self Care | Source: Ambulatory Visit | Attending: Radiation Oncology | Admitting: Radiation Oncology

## 2024-03-18 DIAGNOSIS — I2699 Other pulmonary embolism without acute cor pulmonale: Secondary | ICD-10-CM | POA: Insufficient documentation

## 2024-03-18 DIAGNOSIS — Z510 Encounter for antineoplastic radiation therapy: Secondary | ICD-10-CM | POA: Insufficient documentation

## 2024-03-18 DIAGNOSIS — M8580 Other specified disorders of bone density and structure, unspecified site: Secondary | ICD-10-CM | POA: Insufficient documentation

## 2024-03-18 DIAGNOSIS — Z7952 Long term (current) use of systemic steroids: Secondary | ICD-10-CM | POA: Insufficient documentation

## 2024-03-18 DIAGNOSIS — R609 Edema, unspecified: Secondary | ICD-10-CM | POA: Insufficient documentation

## 2024-03-18 DIAGNOSIS — M25551 Pain in right hip: Secondary | ICD-10-CM | POA: Insufficient documentation

## 2024-03-18 DIAGNOSIS — C7951 Secondary malignant neoplasm of bone: Secondary | ICD-10-CM | POA: Insufficient documentation

## 2024-03-18 DIAGNOSIS — C7931 Secondary malignant neoplasm of brain: Secondary | ICD-10-CM | POA: Insufficient documentation

## 2024-03-18 DIAGNOSIS — Z5112 Encounter for antineoplastic immunotherapy: Secondary | ICD-10-CM | POA: Insufficient documentation

## 2024-03-18 DIAGNOSIS — Z5111 Encounter for antineoplastic chemotherapy: Secondary | ICD-10-CM | POA: Insufficient documentation

## 2024-03-18 DIAGNOSIS — C3491 Malignant neoplasm of unspecified part of right bronchus or lung: Secondary | ICD-10-CM | POA: Insufficient documentation

## 2024-03-18 DIAGNOSIS — Z800 Family history of malignant neoplasm of digestive organs: Secondary | ICD-10-CM | POA: Insufficient documentation

## 2024-03-18 DIAGNOSIS — Z87891 Personal history of nicotine dependence: Secondary | ICD-10-CM | POA: Insufficient documentation

## 2024-03-18 DIAGNOSIS — Z803 Family history of malignant neoplasm of breast: Secondary | ICD-10-CM | POA: Insufficient documentation

## 2024-03-18 DIAGNOSIS — Z7901 Long term (current) use of anticoagulants: Secondary | ICD-10-CM | POA: Insufficient documentation

## 2024-03-18 LAB — T4: T4, Total: 9.1 ug/dL (ref 4.5–12.0)

## 2024-03-18 MED ORDER — LANSOPRAZOLE 30 MG PO CPDR: 30.0000 mg | DELAYED_RELEASE_CAPSULE | Freq: Every day | ORAL | 0 refills | Status: AC

## 2024-03-19 ENCOUNTER — Encounter: Payer: Self-pay | Admitting: Oncology

## 2024-03-19 DIAGNOSIS — Z5111 Encounter for antineoplastic chemotherapy: Secondary | ICD-10-CM | POA: Diagnosis not present

## 2024-03-19 NOTE — Telephone Encounter (Signed)
 Testing completed. Copy given to provider and copy sent to scan

## 2024-03-21 ENCOUNTER — Other Ambulatory Visit: Payer: Self-pay | Admitting: Family Medicine

## 2024-03-23 ENCOUNTER — Other Ambulatory Visit: Payer: Self-pay

## 2024-03-23 ENCOUNTER — Inpatient Hospital Stay

## 2024-03-23 ENCOUNTER — Inpatient Hospital Stay: Admitting: Oncology

## 2024-03-23 ENCOUNTER — Ambulatory Visit
Admission: RE | Admit: 2024-03-23 | Discharge: 2024-03-23 | Attending: Radiation Oncology | Admitting: Radiation Oncology

## 2024-03-23 DIAGNOSIS — Z5111 Encounter for antineoplastic chemotherapy: Secondary | ICD-10-CM | POA: Diagnosis not present

## 2024-03-23 LAB — RAD ONC ARIA SESSION SUMMARY
Course Elapsed Days: 0
Plan Fractions Treated to Date: 1
Plan Prescribed Dose Per Fraction: 3 Gy
Plan Total Fractions Prescribed: 10
Plan Total Prescribed Dose: 30 Gy
Reference Point Dosage Given to Date: 3 Gy
Reference Point Session Dosage Given: 3 Gy
Session Number: 1

## 2024-03-24 ENCOUNTER — Other Ambulatory Visit: Payer: Self-pay

## 2024-03-24 ENCOUNTER — Inpatient Hospital Stay

## 2024-03-24 ENCOUNTER — Inpatient Hospital Stay: Admitting: Oncology

## 2024-03-24 ENCOUNTER — Encounter: Payer: Self-pay | Admitting: Oncology

## 2024-03-24 ENCOUNTER — Ambulatory Visit
Admission: RE | Admit: 2024-03-24 | Discharge: 2024-03-24 | Attending: Radiation Oncology | Admitting: Radiation Oncology

## 2024-03-24 VITALS — BP 127/87 | HR 88 | Temp 97.6°F | Resp 18 | Wt 152.8 lb

## 2024-03-24 DIAGNOSIS — M25551 Pain in right hip: Secondary | ICD-10-CM

## 2024-03-24 DIAGNOSIS — C7931 Secondary malignant neoplasm of brain: Secondary | ICD-10-CM | POA: Diagnosis not present

## 2024-03-24 DIAGNOSIS — I2693 Single subsegmental pulmonary embolism without acute cor pulmonale: Secondary | ICD-10-CM | POA: Diagnosis not present

## 2024-03-24 DIAGNOSIS — C3491 Malignant neoplasm of unspecified part of right bronchus or lung: Secondary | ICD-10-CM

## 2024-03-24 DIAGNOSIS — Z5111 Encounter for antineoplastic chemotherapy: Secondary | ICD-10-CM | POA: Diagnosis not present

## 2024-03-24 LAB — CMP (CANCER CENTER ONLY)
ALT: 21 U/L (ref 0–44)
AST: 14 U/L — ABNORMAL LOW (ref 15–41)
Albumin: 4.4 g/dL (ref 3.5–5.0)
Alkaline Phosphatase: 100 U/L (ref 38–126)
Anion gap: 13 (ref 5–15)
BUN: 25 mg/dL — ABNORMAL HIGH (ref 8–23)
CO2: 23 mmol/L (ref 22–32)
Calcium: 9.7 mg/dL (ref 8.9–10.3)
Chloride: 99 mmol/L (ref 98–111)
Creatinine: 0.71 mg/dL (ref 0.44–1.00)
GFR, Estimated: >60 mL/min (ref >60)
Glucose, Bld: 95 mg/dL (ref 70–99)
Potassium: 3.9 mmol/L (ref 3.5–5.1)
Sodium: 135 mmol/L (ref 135–145)
Total Bilirubin: 0.4 mg/dL (ref 0.0–1.2)
Total Protein: 7.1 g/dL (ref 6.5–8.1)

## 2024-03-24 LAB — RAD ONC ARIA SESSION SUMMARY
Course Elapsed Days: 1
Plan Fractions Treated to Date: 2
Plan Prescribed Dose Per Fraction: 3 Gy
Plan Total Fractions Prescribed: 10
Plan Total Prescribed Dose: 30 Gy
Reference Point Dosage Given to Date: 6 Gy
Reference Point Session Dosage Given: 3 Gy
Session Number: 2

## 2024-03-24 LAB — CBC WITH DIFFERENTIAL (CANCER CENTER ONLY)
Abs Immature Granulocytes: 0.05 K/uL (ref 0.00–0.07)
Basophils Absolute: 0 K/uL (ref 0.0–0.1)
Basophils Relative: 0 %
Eosinophils Absolute: 0.4 K/uL (ref 0.0–0.5)
Eosinophils Relative: 5 %
HCT: 39.7 % (ref 36.0–46.0)
Hemoglobin: 13.4 g/dL (ref 12.0–15.0)
Immature Granulocytes: 1 %
Lymphocytes Relative: 12 %
Lymphs Abs: 1 K/uL (ref 0.7–4.0)
MCH: 27.1 pg (ref 26.0–34.0)
MCHC: 33.8 g/dL (ref 30.0–36.0)
MCV: 80.4 fL (ref 80.0–100.0)
Monocytes Absolute: 0.6 K/uL (ref 0.1–1.0)
Monocytes Relative: 7 %
Neutro Abs: 6.2 K/uL (ref 1.7–7.7)
Neutrophils Relative %: 75 %
Platelet Count: 282 K/uL (ref 150–400)
RBC: 4.94 MIL/uL (ref 3.87–5.11)
RDW: 13.1 % (ref 11.5–15.5)
WBC Count: 8.3 K/uL (ref 4.0–10.5)
nRBC: 0 % (ref 0.0–0.2)

## 2024-03-24 NOTE — Assessment & Plan Note (Signed)
 Pain is secondary to large right iliac bone mass.  Patient gets palliative radiation to right hip continue oxycodone  5 mg every 4-6 hours as needed for pain

## 2024-03-24 NOTE — Progress Notes (Signed)
 Hematology/Oncology Progress note [phone] [phone]       CHIEF COMPLAINTS/PURPOSE OF CONSULTATION:  Stage III lung adenocarcinoam  ASSESSMENT & PLAN:   Cancer Staging  Primary lung adenocarcinoma, right [facility]) Staging form: Lung, AJCC V9 - Clinical stage from 08/15/2023: [name], cM0 - Signed by Babara Call, MD on 08/15/2023 - Clinical stage from 03/09/2024: [name] - Signed by Babara Call, MD on 03/09/2024   Primary lung adenocarcinoma, right [facility]) Images and pathology results were reviewed with the patient. cT2 N2 M0, Stage III right lung adenocarcinoma.  NGS showed TPS 5% CPS 10  KRAS G12C, TMB 12.6   Labs are reviewed and discussed with patient. Proceed with Durvalumab  Q 2weeks  02/13/24 CT scan showed  soft tissue thickening in the right infrahilar region and medial right lower lobe. New extensive airspace opacification in the right perihilar region and right paramediastinal lung in the right middle lobe and right lower lobe. new airspace opacity in the right lower lobe, new spiculated nodule in the right lower lobe 03/03/2024, PET scan findings are concerning for recurrent metastatic lung cancer. She has large hypermetabolic right iliac bone lesion, right lower lobe hypermetabolic lesion and left adrenal gland lesion.- obtain liquid NGS result is pending.. Recommend first-line treatment carboplatin /Alimta /Keytruda as first-line treatment of stage IV setting.  Second line treatment option consider Adagrasib  Labs reviewed and discussed with patient.  Status post cycle 1 carboplatin  and Alimta .  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  She tolerates very well  Metastasis to brain [facility]) MRI results were reviewed with patient. Patient has mild edema surrounding right frontal lobe lesion. Continue dexamethasone  4 mg TID I have discussed with Dr. Lenn who will offer patient brain radiation.  Hold off  Keytruda  Pulmonary embolism (HCC) Anticoagulation with Eliquis  5mg  BID   Right hip pain Pain is secondary to large right iliac bone mass.  Patient gets palliative radiation to right hip continue oxycodone  5 mg every 4-6 hours as needed for pain      Orders Placed This Encounter  Procedures   CBC with Differential (Cancer Center Only)    Standing Status:   Future    Expected Date:   04/28/2024    Expiration Date:   04/28/2025   CMP (Cancer Center only)    Standing Status:   Future    Expected Date:   04/28/2024    Expiration Date:   04/28/2025   T4    Standing Status:   Future    Expected Date:   04/28/2024    Expiration Date:   04/28/2025   TSH    Standing Status:   Future    Expected Date:   04/28/2024    Expiration Date:   04/28/2025   Follow-up 1 week All questions were answered. The patient knows to call the clinic with any problems, questions or concerns.  Call Babara, MD, PhD [facility] Hematology Oncology 03/24/2024    HISTORY OF PRESENTING ILLNESS:  MACKENSEY BOLTE 63 y.o. female presents to establish care for lung cancer  Oncology History  Primary lung adenocarcinoma, right (HCC)  08/03/2023 Imaging   CT angiogram chest PE protocol showed  1. 3.5 x 2.6 cm right hilar mass, with obstruction of the bronchus intermedius, right middle lobe bronchus, and portions of the right lower lobe bronchus as above, highly concerning for neoplasm. Bronchoscopy is recommended for further evaluation. 2. Subcarinal adenopathy concerning for metastatic disease. 3. Dense medial segment consolidation  within the right middle lobe with associated volume loss, consistent with atelectasis. 4. Prominent right lower lobe bronchiectasis, with fluid-filled bronchi and patchy right basilar consolidation consistent with combination of airspace disease and atelectasis. 5. No evidence of pulmonary embolus. 6. Aortic Atherosclerosis (ICD10-I70.0). Coronary artery atherosclerosis.   08/06/2023  Initial Diagnosis   Primary lung adenocarcinoma, right (HCC)  08/03/2023, patient presented to emergency room for evaluation of hemoptysis. Patient had pneumonia in March 2025, she continues to have a lingering cough for the past months despite being treated with doxycycline  and prednisone . She coughed up small amount of bright red blood on 08/03/2023.  Denies any shortness of breath, chest pain unintentional weight loss headache.  She felt feverish with chills 2 nights ago. Patient is a former smoker, quit smoking in 2011. Patient denies being on any antiplatelet or anticoagulation agents.  08/08/2023 She underwent biopsy via bronchoscopy.  FINAL MICROSCOPIC DIAGNOSIS:  A. LUNG, RLL, BRUSHING:  - Adenocarcinoma   B. LUNG, RLL, FINE NEEDLE ASPIRATION:  - Adenocarcinoma   Immunohistochemical stains were performed to characterize the tumor cells. The cells are negative for TTF-1, Napsin A, p40, synaptophysin, Chromogranin A, and CD56.  Mucicarmine stain highlights intracytoplasmic mucin. Ki67 proliferation index is approximately 30%. The overall findings are supportive of the diagnosis of adenocarcinoma.   C. LYMPH NODE, 7, FINE NEEDLE ASPIRATION:  - Adenocarcinoma  - Lymphoid tissue present   D. LYMPH NODE, 4R, FINE NEEDLE ASPIRATION:  - No malignant cells identified  - Lymphoid tissue present   E. LYMPH NODE, 11R, FINE NEEDLE ASPIRATION:  - No malignant cells identified  - Lymphoid tissue present   F. LUNG, RLL, LAVAGE:  FINAL MICROSCOPIC DIAGNOSIS:  - Atypical cells present    08/15/2023 Cancer Staging   Staging form: Lung, AJCC V9 - Clinical stage from 08/15/2023: [name], cM0 - Signed by Babara Call, MD on 08/15/2023 Stage prefix: Initial diagnosis Method of lymph node assessment: Clinical   09/09/2023 - 10/21/2023 Chemotherapy   Patient is on Treatment Plan : LUNG Carboplatin  + Paclitaxel  + XRT q7d     11/22/2023 Imaging   CT chest with contrast   1. Diminished size subcarinal  and right hilar node or mass, measuring 1.5 x 0.9 cm, previously 3.4 x 3.2 cm. Findings are consistent with treatment response. 2. No evidence of new or progressive disease in the chest. 3. Coronary artery disease   11/29/2023 - 02/21/2024 Chemotherapy   Patient is on Treatment Plan : LUNG Durvalumab  (10) q14d     02/17/2024 Imaging   CT chest w contrast  1. Significant interval change in appearance of the right lung as detailed above. This certainly could reflect radiation pneumonitis if the patient has received any new RT. Pneumonia or aspiration would be another consideration. I believe it is unlikely that this is recurrent tumor given the fact that the last CT showed a very good response to treatment and given its rapid onset and extensive appearance. However, that does remain a possibility. Recommend correlation with clinical findings of pneumonia and appropriate treatment. A short-term follow-up chest CT in 2-3 months may be helpful to reassess this process. 2. 12 mm spiculated appearing nodule in the right lower lobe laterally could be part of the same process. 3. No findings for her metastatic disease involving the upper abdomen or bony structures.   03/03/2024 Imaging   PET scan showed 1. New large lytic bone lesion with overlying cortical destruction in the right iliac bone measuring 5.0 x 5.3 cm  with SUV max of 43.3, compatible with osseous metastasis. 2. New tracer-avid nodule within the left adrenal gland measuring 0.9 cm with SUV max of 10.1, suspicious for metastatic disease. 3. Tracer-avid right lower lobe pulmonary nodule measuring 1.1 cm with SUV max of 9.5, increased from 0.8 cm on 11/22/2023 and slightly decreased from 1.3 cm on 02/13/2024, suspicious for active malignancy. 4. Interval resolution of the previous tracer avid right hilar mass and adjacent subcarinal hypermetabolic lymph node. 5. Extensive right perihilar and paramediastinal fibrotic change compatible  with post-radiation effect, with increased tracer uptake reflecting treatment-related inflammation.   03/09/2024 Cancer Staging   Staging form: Lung, AJCC V9 - Clinical stage from 03/09/2024: Matt Banker, rcM1 - Signed by Babara Call, MD on 03/09/2024 Stage prefix: Recurrence Method of lymph node assessment: Clinical   03/16/2024 Imaging   MRI brain with and without contrast showed 1. New 1 cm enhancing lesion in the right frontal lobe with mild edema most compatible with a metastasis. 2. Mild chronic small vessel ischemic disease   03/17/2024 -  Chemotherapy   Patient is on Treatment Plan : LUNG Carboplatin  (5) + Pemetrexed  (500) + Pembrolizumab (200) D1 q21d Induction x 4 cycles / Maintenance Pemetrexed  (500) + Pembrolizumab (200) D1 q21d      09/23/2023 diagnosis of Segmental pulmonary emboli in the lingula. She takes Eliquis  5mg  BID She denies any cough, SOB chest pain, diarrhea.  Patient denies headache, focal weakness. Patient presents for evaluation of after chemotherapy.   She reports feeling well.  Denies nausea vomiting or diarrhea.  Right hip pain is manageable with current pain medication.  She has started on palliative radiation to right hip and there is plan for brain radiation  early January.  She takes dexamethasone  4 mg 3 times a day.  She tolerates well except some sleep disturbance  MEDICAL HISTORY:  Past Medical History:  Diagnosis Date   COVID-19 07/2018   Hyperlipidemia    Hypertension    Lung cancer (HCC) 07/2023   Motion sickness    amuesment park rides   Osteopenia 11/01/2015   Pneumonia    april 2025   Psoriasis    Vertigo    none for over 5 yrs   Wears dentures    full upper and lower    SURGICAL HISTORY: Past Surgical History:  Procedure Laterality Date   BRONCHIAL BIOPSY  08/08/2023   Procedure: BRONCHOSCOPY, WITH BIOPSY;  Surgeon: Malka Domino, MD;  Location: [facility] END[facility];  Service: Pulmonary;;   BRONCHIAL BRUSHINGS  08/08/2023   Procedure:  BRONCHOSCOPY, WITH BRUSH BIOPSY;  Surgeon: Malka Domino, MD;  Location: [facility];  Service: Pulmonary;;   BRONCHIAL NEEDLE ASPIRATION BIOPSY  08/08/2023   Procedure: BRONCHOSCOPY, WITH NEEDLE ASPIRATION BIOPSY;  Surgeon: Malka Domino, MD;  Location: [facility] ENDOSCOPY;  Service: Pulmonary;;   CHOLECYSTECTOMY  1990   COLONOSCOPY WITH PROPOFOL  N/A 07/15/2020   Procedure: COLONOSCOPY WITH PROPOFOL ;  Surgeon: Jinny Carmine, MD;  Location: [facility];  Service: Endoscopy;  Laterality: N/A;  priority 4   ENDOBRONCHIAL ULTRASOUND Bilateral 08/08/2023   Procedure: ENDOBRONCHIAL ULTRASOUND (EBUS);  Surgeon: Malka Domino, MD;  Location: [facility];  Service: Pulmonary;  Laterality: Bilateral;   IR IMAGING GUIDED PORT INSERTION  08/29/2023   TUBAL LIGATION      SOCIAL HISTORY: Social History   Socioeconomic History   Marital status: Married    Spouse name: Oneil   Number of children: 1   Years of education: Not on file   Highest education level:  High school graduate  Occupational History   Not on file  Tobacco Use   Smoking status: Former    Current packs/day: 0.00    Average packs/day: 1 pack/day for 28.0 years (28.0 ttl pk-yrs)    Types: Cigarettes    Start date: 28    Quit date: 2011    Years since quitting: 14.9   Smokeless tobacco: Never   Tobacco comments:    Started smoking around 63 yrs old.    Smoked 1PPD at her heaviest.    Quit smoking in 2011- leda 08/07/2023  Vaping Use   Vaping status: Former  Substance and Sexual Activity   Alcohol use: No    Alcohol/week: 0.0 standard drinks of alcohol    Comment: rare   Drug use: No   Sexual activity: Yes    Birth control/protection: Surgical  Other Topics Concern   Not on file  Social History Narrative   Not on file   Social Drivers of Health   Tobacco Use: Medium Risk (03/24/2024)   Patient History    Smoking Tobacco Use: Former    Smokeless Tobacco Use: Never    Passive Exposure: Not on Programmer, Applications Strain: Not on file  Food Insecurity: No Food Insecurity (09/24/2023)   Epic    Worried About Programme Researcher, Broadcasting/film/video in the Last Year: Never true    Ran Out of Food in the Last Year: Never true  Transportation Needs: No Transportation Needs (09/24/2023)   Epic    Lack of Transportation (Medical): No    Lack of Transportation (Non-Medical): No  Physical Activity: Insufficiently Active (02/11/2024)   Exercise Vital Sign    Days of Exercise per Week: 4 days    Minutes of Exercise per Session: 30 min  Stress: No Stress Concern Present (02/11/2024)   [employer] of Occupational Health - Occupational Stress Questionnaire    Feeling of Stress: Only a little  Social Connections: Moderately Isolated (09/24/2023)   Social Connection and Isolation Panel    Frequency of Communication with Friends and Family: More than three times a week    Frequency of Social Gatherings with Friends and Family: Twice a week    Attends Religious Services: Never    Database Administrator or Organizations: No    Attends Banker Meetings: Never    Marital Status: Married  Catering Manager Violence: Not At Risk (09/24/2023)   Epic    Fear of Current or Ex-Partner: No    Emotionally Abused: No    Physically Abused: No    Sexually Abused: No  Depression (PHQ2-9): Low Risk (03/17/2024)   Depression (PHQ2-9)    PHQ-2 Score: 0  Recent Concern: Depression (PHQ2-9) - Medium Risk (02/11/2024)   Depression (PHQ2-9)    PHQ-2 Score: 8  Alcohol Screen: Low Risk (02/11/2024)   Alcohol Screen    Last Alcohol Screening Score (AUDIT): 0  Housing: Low Risk (09/24/2023)   Epic    Unable to Pay for Housing in the Last Year: No    Number of Times Moved in the Last Year: 0    Homeless in the Last Year: No  Utilities: Not At Risk (09/24/2023)   Epic    Threatened with loss of utilities: No  Health Literacy: Not on file    FAMILY HISTORY: Family History  Problem Relation Age of Onset   Colon  cancer Brother        33    Breast cancer Mother  Breast cancer Maternal Aunt    Pneumonia Father    Diabetes Maternal Grandmother     ALLERGIES:  is allergic to lisinopril .  MEDICATIONS:  Current Outpatient Medications  Medication Sig Dispense Refill   ALPRAZolam  (XANAX ) 0.5 MG tablet Take 1 tablet (0.5 mg total) by mouth daily as needed for anxiety. 30 tablet 0   atorvastatin  (LIPITOR) 10 MG tablet Take 1 tablet (10 mg total) by mouth at bedtime. 90 tablet 0   cyclobenzaprine  (FLEXERIL ) 10 MG tablet Take 1 tablet (10 mg total) by mouth at bedtime. 30 tablet 0   dexamethasone  (DECADRON ) 4 MG tablet Take 1 tablet (4 mg total) by mouth 3 (three) times daily. 90 tablet 0   ELIQUIS  5 MG TABS tablet Take 1 tablet by mouth twice daily 60 tablet 0   folic acid  (FOLVITE ) 1 MG tablet Take 1 tablet (1 mg total) by mouth daily. Start 7 days before pemetrexed  chemotherapy. Continue until 21 days after pemetrexed  completed. 100 tablet 3   lansoprazole  (PREVACID ) 30 MG capsule Take 1 capsule (30 mg total) by mouth daily at 12 noon. 30 capsule 0   levalbuterol  (XOPENEX  HFA) 45 MCG/ACT inhaler Inhale 2 puffs into the lungs every 6 (six) hours as needed for wheezing. 15 g 3   lidocaine -prilocaine  (EMLA ) cream Apply to affected area once 30 g 3   losartan  (COZAAR ) 25 MG tablet Take 1 tablet (25 mg total) by mouth daily. 45 tablet 0   meclizine  (ANTIVERT ) 25 MG tablet Take 1 tablet (25 mg total) by mouth 3 (three) times daily as needed for dizziness. 30 tablet 3   ondansetron  (ZOFRAN ) 8 MG tablet Take 1 tablet (8 mg total) by mouth See admin instructions. Start after 72 hours after carboplatin  60 tablet 3   oxyCODONE  (OXY IR/ROXICODONE ) 5 MG immediate release tablet Take 1 tablet (5 mg total) by mouth every 4 (four) hours as needed for severe pain (pain score 7-10). 45 tablet 0   prochlorperazine  (COMPAZINE ) 10 MG tablet Take 1 tablet (10 mg total) by mouth every 6 (six) hours as needed for nausea or  vomiting. 60 tablet 3   triamcinolone  ointment (KENALOG ) 0.5 % Apply 1 Application topically 2 (two) times daily. 30 g 6   No current facility-administered medications for this visit.    Review of Systems  Constitutional:  Negative for appetite change, chills, fatigue and fever.  HENT:   Negative for hearing loss and voice change.   Eyes:  Negative for eye problems.  Respiratory:  Negative for chest tightness, cough and hemoptysis.   Cardiovascular:  Negative for chest pain.  Gastrointestinal:  Negative for abdominal distention, abdominal pain and blood in stool.  Endocrine: Negative for hot flashes.  Genitourinary:  Negative for difficulty urinating and frequency.   Musculoskeletal:  Negative for arthralgias.       Right sciatic pain  Skin:  Negative for itching and rash.  Neurological:  Negative for extremity weakness.  Hematological:  Negative for adenopathy.  Psychiatric/Behavioral:  Positive for sleep disturbance. Negative for confusion.      PHYSICAL EXAMINATION: ECOG PERFORMANCE STATUS: 0 - Asymptomatic  Vitals:   03/24/24 0851  BP: 127/87  Pulse: 88  Resp: 18  Temp: 97.6 F (36.4 C)  SpO2: 98%   Filed Weights   03/24/24 0851  Weight: 152 lb 12.8 oz (69.3 kg)    Physical Exam Constitutional:      General: She is not in acute distress.    Appearance: She is not diaphoretic.  HENT:     Head: Normocephalic and atraumatic.  Eyes:     General: No scleral icterus. Cardiovascular:     Rate and Rhythm: Normal rate and regular rhythm.     Heart sounds: No murmur heard. Pulmonary:     Effort: Pulmonary effort is normal. No respiratory distress.     Breath sounds: No wheezing.  Abdominal:     General: There is no distension.     Palpations: Abdomen is soft.     Tenderness: There is no abdominal tenderness.  Musculoskeletal:        General: Normal range of motion.     Cervical back: Normal range of motion and neck supple.  Skin:    General: Skin is warm and  dry.     Findings: No erythema.  Neurological:     Mental Status: She is alert and oriented to person, place, and time. Mental status is at baseline.     Motor: No abnormal muscle tone.  Psychiatric:        Mood and Affect: Affect normal.      LABORATORY DATA:  I have reviewed the data as listed    Latest Ref Rng & Units 03/24/2024    8:23 AM 03/17/2024   10:58 AM 03/09/2024    8:16 AM  CBC  WBC 4.0 - 10.5 K/uL 8.3  5.9  6.1   Hemoglobin 12.0 - 15.0 g/dL 86.5  88.2  88.5   Hematocrit 36.0 - 46.0 % 39.7  35.0  34.5   Platelets 150 - 400 K/uL 282  253  212       Latest Ref Rng & Units 03/24/2024    8:30 AM 03/17/2024   10:58 AM 03/09/2024    8:16 AM  CMP  Glucose 70 - 99 mg/dL 95  98  84   BUN 8 - 23 mg/dL 25  15  14    Creatinine 0.44 - 1.00 mg/dL 9.28  9.21  9.33   Sodium 135 - 145 mmol/L 135  138  139   Potassium 3.5 - 5.1 mmol/L 3.9  3.7  3.7   Chloride 98 - 111 mmol/L 99  102  103   CO2 22 - 32 mmol/L 23  23  25    Calcium  8.9 - 10.3 mg/dL 9.7  9.8  9.4   Total Protein 6.5 - 8.1 g/dL 7.1  6.8  6.4   Total Bilirubin 0.0 - 1.2 mg/dL 0.4  0.5  0.3   Alkaline Phos 38 - 126 U/L 100  108  95   AST 15 - 41 U/L 14  38  17   ALT 0 - 44 U/L 21  39  24      RADIOGRAPHIC STUDIES: I have personally reviewed the radiological images as listed and agreed with the findings in the report. MR Brain W Wo Contrast Result Date: 03/16/2024 EXAM: MRI BRAIN WITH AND WITHOUT CONTRAST 03/16/2024 08:33:37 AM TECHNIQUE: Multiplanar multisequence MRI of the head/brain was performed with and without the administration of intravenous contrast. COMPARISON: MR Head 08/13/2023. CLINICAL HISTORY: Lung cancer. FINDINGS: BRAIN AND VENTRICLES: No acute infarct, intracranial hemorrhage, midline shift, hydrocephalus, or extra-axial fluid collection is identified. Scattered small T2 hyperintensities in the cerebral white matter bilaterally are unchanged and nonspecific but compatible with mild chronic small  vessel ischemic disease. Cerebral volume is within normal limits for age. A new solidly enhancing mass in the medial right frontal lobe measures 1.0 cm with associated mild vasogenic edema (series 18 image 118).  No second enhancing brain lesion is identified. Major intracranial vascular flow voids are preserved. ORBITS: No acute abnormality. SINUSES: No acute abnormality. BONES AND SOFT TISSUES: Normal bone marrow signal and enhancement. No acute soft tissue abnormality. IMPRESSION: 1. New 1 cm enhancing lesion in the right frontal lobe with mild edema most compatible with a metastasis. 2. Mild chronic small vessel ischemic disease. Electronically signed by: Dasie Hamburg MD 03/16/2024 04:39 PM EST RP Workstation: [id number]   NM PET Image Restag (PS) Skull Base To Thigh Result Date: 03/09/2024 EXAM: PET AND CT SKULL BASE TO MID THIGH 03/03/2024 11:39:26 AM TECHNIQUE: RADIOPHARMACEUTICAL: 8.79 mCi F-18 FDG Uptake time 60 minutes. Glucose level 76 mg/dl. PET imaging was acquired from the base of the skull to the mid thighs. Non-contrast enhanced computed tomography was obtained for attenuation correction and anatomic localization. COMPARISON: CT chest 02/13/2024 and PET/CT from 08/09/2023. CLINICAL HISTORY: lung cancer FINDINGS: HEAD AND NECK: No metabolically active cervical lymphadenopathy. CHEST: Extensive paramediastinal perihilar and paramediastinal fibrotic change involving the right middle, right upper, and right lower lobes compatible with changes due to external beam radiation. Increased tracer uptake throughout this area reflecting post-radiation inflammatory change. There has been interval resolution of the previous tracer avid right hilar mass and adjacent subcarinal hypermetabolic lymph node. Tracer avid nodule within the right lower lobe measures 1.1 cm with an SUV max of 9.5, image 59 of the axial images. On the CT of the chest from 02/13/2024 this nodule measured 1.3 cm. On the CT from 11/22/2023  this nodule measured 0.8 cm. No metabolically active lymphadenopathy. Coronary artery calcifications. Mild cardiac enlargement. Trace pericardial effusion is new from the previous exam, image 60/6. ABDOMEN AND PELVIS: Within the left adrenal gland, there is a tracer-avid nodule measuring 0.9 mm with SUV max of 10.1, axial image 78. This is new when compared with the previous PET/CT from 08/09/2023. Normal appearance of the right adrenal gland. Status post cholecystectomy. Aortic atherosclerotic calcifications. Punctate stone noted within the upper pole of the right kidney. Physiologic activity within the gastrointestinal and genitourinary systems. No metabolically active intraperitoneal mass. No metabolically active lymphadenopathy. BONES AND SOFT TISSUE: Within the right iliac bone there is a new large lytic bone lesion with overlying cortical destruction measuring 5.0 x 5.3 cm with SUV max of 43.3, axial image 128. IMPRESSION: 1. New large lytic bone lesion with overlying cortical destruction in the right iliac bone measuring 5.0 x 5.3 cm with SUV max of 43.3, compatible with osseous metastasis. 2. New tracer-avid nodule within the left adrenal gland measuring 0.9 cm with SUV max of 10.1, suspicious for metastatic disease. 3. Tracer-avid right lower lobe pulmonary nodule measuring 1.1 cm with SUV max of 9.5, increased from 0.8 cm on 11/22/2023 and slightly decreased from 1.3 cm on 02/13/2024, suspicious for active malignancy. 4. Interval resolution of the previous tracer avid right hilar mass and adjacent subcarinal hypermetabolic lymph node. 5. Extensive right perihilar and paramediastinal fibrotic change compatible with post-radiation effect, with increased tracer uptake reflecting treatment-related inflammation. Electronically signed by: Waddell Calk MD 03/09/2024 08:41 AM EST RP Workstation: [id number]   DG Hip Unilat With Pelvis 2-3 Views Right Result Date: 02/26/2024 CLINICAL DATA:  Right hip pain 2  months.  No injury. EXAM: DG HIP (WITH OR WITHOUT PELVIS) 2-3V RIGHT COMPARISON:  None Available. FINDINGS: Bones, joint spaces and soft tissues over the hips are normal without fracture or dislocation. No focal lytic or sclerotic lesion over the hips. Minimal degenerate change over the spine, sacroiliac  joints and symphysis pubis joint. IMPRESSION: 1. No acute findings. 2. Minimal degenerative changes as described. Electronically Signed   By: Toribio Agreste M.D.   On: 02/26/2024 12:47

## 2024-03-24 NOTE — Assessment & Plan Note (Signed)
?   Anticoagulation with Eliquis 5 mg BID

## 2024-03-24 NOTE — Progress Notes (Signed)
 No IVF needed today.

## 2024-03-24 NOTE — Telephone Encounter (Signed)
 Requested Prescriptions  Pending Prescriptions Disp Refills   losartan  (COZAAR ) 25 MG tablet [Pharmacy Med Name: Losartan  Potassium 25 MG Oral Tablet] 45 tablet 0    Sig: Take 1 tablet by mouth once daily     Cardiovascular:  Angiotensin Receptor Blockers Passed - 03/24/2024 12:11 PM      Passed - Cr in normal range and within 180 days    Creatinine  Date Value Ref Range Status  03/24/2024 0.71 0.44 - 1.00 mg/dL Final   Creat  Date Value Ref Range Status  11/11/2019 0.91 0.50 - 1.05 mg/dL Final    Comment:    For patients >45 years of age, the reference limit for Creatinine is approximately 13% higher for people identified as African-American. .          Passed - K in normal range and within 180 days    Potassium  Date Value Ref Range Status  03/24/2024 3.9 3.5 - 5.1 mmol/L Final         Passed - Patient is not pregnant      Passed - Last BP in normal range    BP Readings from Last 1 Encounters:  03/24/24 127/87         Passed - Valid encounter within last 6 months    Recent Outpatient Visits           1 month ago Routine general medical examination at a health care facility   [facility] [place], [place] P, DO   8 months ago Pneumonia of right lower lobe due to infectious organism   [facility] [place], Megan P, DO   9 months ago Primary hypertension   [facility] [place], [place], DO

## 2024-03-24 NOTE — Assessment & Plan Note (Signed)
 MRI results were reviewed with patient. Patient has mild edema surrounding right frontal lobe lesion. Continue dexamethasone  4 mg TID I have discussed with Dr. Chrystal who will offer patient brain radiation.  Hold off Keytruda

## 2024-03-24 NOTE — Assessment & Plan Note (Addendum)
 Images and pathology results were reviewed with the patient. cT2 N2 M0, Stage III right lung adenocarcinoma.  NGS showed TPS 5% CPS 10  KRAS G12C, TMB 12.6   Labs are reviewed and discussed with patient. Proceed with Durvalumab  Q 2weeks  02/13/24 CT scan showed  soft tissue thickening in the right infrahilar region and medial right lower lobe. New extensive airspace opacification in the right perihilar region and right paramediastinal lung in the right middle lobe and right lower lobe. new airspace opacity in the right lower lobe, new spiculated nodule in the right lower lobe 03/03/2024, PET scan findings are concerning for recurrent metastatic lung cancer. She has large hypermetabolic right iliac bone lesion, right lower lobe hypermetabolic lesion and left adrenal gland lesion.- obtain liquid NGS result is pending.. Recommend first-line treatment carboplatin /Alimta /Keytruda as first-line treatment of stage IV setting.  Second line treatment option consider Adagrasib  Labs reviewed and discussed with patient.  Status post cycle 1 carboplatin  and Alimta .  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  She tolerates very well

## 2024-03-25 ENCOUNTER — Ambulatory Visit
Admission: RE | Admit: 2024-03-25 | Discharge: 2024-03-25 | Attending: Radiation Oncology | Admitting: Radiation Oncology

## 2024-03-25 ENCOUNTER — Other Ambulatory Visit: Payer: Self-pay

## 2024-03-25 ENCOUNTER — Other Ambulatory Visit: Payer: Self-pay | Admitting: Family Medicine

## 2024-03-25 DIAGNOSIS — Z5111 Encounter for antineoplastic chemotherapy: Secondary | ICD-10-CM | POA: Diagnosis not present

## 2024-03-25 LAB — RAD ONC ARIA SESSION SUMMARY
Course Elapsed Days: 2
Plan Fractions Treated to Date: 3
Plan Prescribed Dose Per Fraction: 3 Gy
Plan Total Fractions Prescribed: 10
Plan Total Prescribed Dose: 30 Gy
Reference Point Dosage Given to Date: 9 Gy
Reference Point Session Dosage Given: 3 Gy
Session Number: 3

## 2024-03-25 NOTE — Telephone Encounter (Signed)
 Copied from CRM #[id number]. Topic: Clinical - Medication Question >> Mar 25, 2024  9:09 AM Franky GRADE wrote: Reason for CRM: Patient is calling to follow up on a refill request the pharmacy submitted for losartan  (COZAAR ) 25 MG tablet [[id number]], They advised patient to contact the office.

## 2024-03-26 ENCOUNTER — Other Ambulatory Visit: Payer: Self-pay

## 2024-03-26 ENCOUNTER — Ambulatory Visit

## 2024-03-26 ENCOUNTER — Telehealth: Payer: Self-pay | Admitting: *Deleted

## 2024-03-26 DIAGNOSIS — Z5111 Encounter for antineoplastic chemotherapy: Secondary | ICD-10-CM | POA: Diagnosis not present

## 2024-03-26 LAB — RAD ONC ARIA SESSION SUMMARY
Course Elapsed Days: 3
Plan Fractions Treated to Date: 4
Plan Prescribed Dose Per Fraction: 3 Gy
Plan Total Fractions Prescribed: 10
Plan Total Prescribed Dose: 30 Gy
Reference Point Dosage Given to Date: 12 Gy
Reference Point Session Dosage Given: 3 Gy
Session Number: 4

## 2024-03-26 MED ORDER — NYSTATIN 100000 UNIT/ML MT SUSP: 5.0000 mL | Freq: Four times a day (QID) | OROMUCOSAL | 2 refills | Status: AC

## 2024-03-26 NOTE — Telephone Encounter (Signed)
 Pt called in to report symptoms of oral thrush. Per Dr. Babara, pt needs prescription for nystatin  mouthwash. Rx sent into pharmacy. Pt made aware.

## 2024-03-27 ENCOUNTER — Other Ambulatory Visit: Payer: Self-pay

## 2024-03-27 ENCOUNTER — Ambulatory Visit
Admission: RE | Admit: 2024-03-27 | Discharge: 2024-03-27 | Attending: Radiation Oncology | Admitting: Radiation Oncology

## 2024-03-27 DIAGNOSIS — Z5111 Encounter for antineoplastic chemotherapy: Secondary | ICD-10-CM | POA: Diagnosis not present

## 2024-03-27 LAB — RAD ONC ARIA SESSION SUMMARY
Course Elapsed Days: 4
Plan Fractions Treated to Date: 5
Plan Prescribed Dose Per Fraction: 3 Gy
Plan Total Fractions Prescribed: 10
Plan Total Prescribed Dose: 30 Gy
Reference Point Dosage Given to Date: 15 Gy
Reference Point Session Dosage Given: 3 Gy
Session Number: 5

## 2024-03-27 MED ORDER — LOSARTAN POTASSIUM 25 MG PO TABS: 25.0000 mg | ORAL_TABLET | Freq: Every day | ORAL | 0 refills | Status: AC

## 2024-03-27 NOTE — Telephone Encounter (Signed)
 Requested Prescriptions  Pending Prescriptions Disp Refills   losartan  (COZAAR ) 25 MG tablet 90 tablet 0    Sig: Take 1 tablet (25 mg total) by mouth daily.     Cardiovascular:  Angiotensin Receptor Blockers Passed - 03/27/2024  2:40 PM      Passed - Cr in normal range and within 180 days    Creatinine  Date Value Ref Range Status  03/24/2024 0.71 0.44 - 1.00 mg/dL Final   Creat  Date Value Ref Range Status  11/11/2019 0.91 0.50 - 1.05 mg/dL Final    Comment:    For patients >34 years of age, the reference limit for Creatinine is approximately 13% higher for people identified as African-American. .          Passed - K in normal range and within 180 days    Potassium  Date Value Ref Range Status  03/24/2024 3.9 3.5 - 5.1 mmol/L Final         Passed - Patient is not pregnant      Passed - Last BP in normal range    BP Readings from Last 1 Encounters:  03/24/24 127/87         Passed - Valid encounter within last 6 months    Recent Outpatient Visits           1 month ago Routine general medical examination at a health care facility   [facility], [place] P, DO   8 months ago Pneumonia of right lower lobe due to infectious organism   [facility] [place], Megan P, DO   9 months ago Primary hypertension   [facility] [place], [place], DO

## 2024-03-30 ENCOUNTER — Other Ambulatory Visit: Payer: Self-pay

## 2024-03-30 ENCOUNTER — Other Ambulatory Visit: Payer: Self-pay | Admitting: *Deleted

## 2024-03-30 ENCOUNTER — Ambulatory Visit
Admission: RE | Admit: 2024-03-30 | Discharge: 2024-03-30 | Attending: Radiation Oncology | Admitting: Radiation Oncology

## 2024-03-30 DIAGNOSIS — Z5111 Encounter for antineoplastic chemotherapy: Secondary | ICD-10-CM | POA: Diagnosis not present

## 2024-03-30 LAB — RAD ONC ARIA SESSION SUMMARY
Course Elapsed Days: 7
Plan Fractions Treated to Date: 6
Plan Prescribed Dose Per Fraction: 3 Gy
Plan Total Fractions Prescribed: 10
Plan Total Prescribed Dose: 30 Gy
Reference Point Dosage Given to Date: 18 Gy
Reference Point Session Dosage Given: 3 Gy
Session Number: 6

## 2024-03-30 MED ORDER — DEXAMETHASONE 4 MG PO TABS: 4.0000 mg | ORAL_TABLET | Freq: Two times a day (BID) | ORAL | 2 refills | Status: AC

## 2024-03-31 ENCOUNTER — Ambulatory Visit
Admission: RE | Admit: 2024-03-31 | Discharge: 2024-03-31 | Disposition: A | Discharge status: Home / Self Care | Source: Ambulatory Visit | Attending: Radiation Oncology | Admitting: Radiation Oncology

## 2024-03-31 ENCOUNTER — Other Ambulatory Visit: Payer: Self-pay

## 2024-03-31 DIAGNOSIS — Z5111 Encounter for antineoplastic chemotherapy: Secondary | ICD-10-CM | POA: Diagnosis not present

## 2024-03-31 LAB — RAD ONC ARIA SESSION SUMMARY
Course Elapsed Days: 8
Plan Fractions Treated to Date: 7
Plan Prescribed Dose Per Fraction: 3 Gy
Plan Total Fractions Prescribed: 10
Plan Total Prescribed Dose: 30 Gy
Reference Point Dosage Given to Date: 21 Gy
Reference Point Session Dosage Given: 3 Gy
Session Number: 7

## 2024-04-01 ENCOUNTER — Other Ambulatory Visit: Payer: Self-pay

## 2024-04-01 ENCOUNTER — Ambulatory Visit
Admission: RE | Admit: 2024-04-01 | Discharge: 2024-04-01 | Disposition: A | Source: Ambulatory Visit | Attending: Radiation Oncology | Admitting: Radiation Oncology

## 2024-04-01 DIAGNOSIS — Z5111 Encounter for antineoplastic chemotherapy: Secondary | ICD-10-CM | POA: Diagnosis not present

## 2024-04-01 LAB — RAD ONC ARIA SESSION SUMMARY
Course Elapsed Days: 9
Plan Fractions Treated to Date: 8
Plan Prescribed Dose Per Fraction: 3 Gy
Plan Total Fractions Prescribed: 10
Plan Total Prescribed Dose: 30 Gy
Reference Point Dosage Given to Date: 24 Gy
Reference Point Session Dosage Given: 3 Gy
Session Number: 8

## 2024-04-06 ENCOUNTER — Inpatient Hospital Stay

## 2024-04-06 ENCOUNTER — Other Ambulatory Visit: Payer: Self-pay

## 2024-04-06 ENCOUNTER — Inpatient Hospital Stay: Admitting: Oncology

## 2024-04-06 ENCOUNTER — Ambulatory Visit
Admission: RE | Admit: 2024-04-06 | Discharge: 2024-04-06 | Disposition: A | Discharge status: Home / Self Care | Source: Ambulatory Visit | Attending: Radiation Oncology | Admitting: Radiation Oncology

## 2024-04-06 DIAGNOSIS — Z5111 Encounter for antineoplastic chemotherapy: Secondary | ICD-10-CM | POA: Diagnosis not present

## 2024-04-06 LAB — RAD ONC ARIA SESSION SUMMARY
Course Elapsed Days: 14
Plan Fractions Treated to Date: 9
Plan Prescribed Dose Per Fraction: 3 Gy
Plan Total Fractions Prescribed: 10
Plan Total Prescribed Dose: 30 Gy
Reference Point Dosage Given to Date: 27 Gy
Reference Point Session Dosage Given: 3 Gy
Session Number: 9

## 2024-04-07 ENCOUNTER — Inpatient Hospital Stay

## 2024-04-07 ENCOUNTER — Inpatient Hospital Stay: Admitting: Oncology

## 2024-04-07 ENCOUNTER — Other Ambulatory Visit: Payer: Self-pay

## 2024-04-07 ENCOUNTER — Encounter: Payer: Self-pay | Admitting: Oncology

## 2024-04-07 ENCOUNTER — Ambulatory Visit
Admission: RE | Admit: 2024-04-07 | Discharge: 2024-04-07 | Disposition: A | Discharge status: Home / Self Care | Source: Ambulatory Visit | Attending: Radiation Oncology | Admitting: Radiation Oncology

## 2024-04-07 VITALS — BP 101/62 | HR 80

## 2024-04-07 VITALS — BP 105/59 | HR 79 | Temp 96.9°F | Resp 18 | Wt 148.6 lb

## 2024-04-07 DIAGNOSIS — C7951 Secondary malignant neoplasm of bone: Secondary | ICD-10-CM | POA: Insufficient documentation

## 2024-04-07 DIAGNOSIS — Z5112 Encounter for antineoplastic immunotherapy: Secondary | ICD-10-CM | POA: Diagnosis not present

## 2024-04-07 DIAGNOSIS — M25551 Pain in right hip: Secondary | ICD-10-CM

## 2024-04-07 DIAGNOSIS — Z5111 Encounter for antineoplastic chemotherapy: Secondary | ICD-10-CM | POA: Diagnosis not present

## 2024-04-07 DIAGNOSIS — C3491 Malignant neoplasm of unspecified part of right bronchus or lung: Secondary | ICD-10-CM

## 2024-04-07 DIAGNOSIS — C7931 Secondary malignant neoplasm of brain: Secondary | ICD-10-CM

## 2024-04-07 DIAGNOSIS — G4700 Insomnia, unspecified: Secondary | ICD-10-CM | POA: Insufficient documentation

## 2024-04-07 DIAGNOSIS — I2693 Single subsegmental pulmonary embolism without acute cor pulmonale: Secondary | ICD-10-CM | POA: Diagnosis not present

## 2024-04-07 DIAGNOSIS — G4709 Other insomnia: Secondary | ICD-10-CM | POA: Diagnosis not present

## 2024-04-07 LAB — CBC WITH DIFFERENTIAL (CANCER CENTER ONLY)
Abs Immature Granulocytes: 0.09 K/uL — ABNORMAL HIGH (ref 0.00–0.07)
Basophils Absolute: 0 K/uL (ref 0.0–0.1)
Basophils Relative: 0 %
Eosinophils Absolute: 0 K/uL (ref 0.0–0.5)
Eosinophils Relative: 0 %
HCT: 33.9 % — ABNORMAL LOW (ref 36.0–46.0)
Hemoglobin: 11.3 g/dL — ABNORMAL LOW (ref 12.0–15.0)
Immature Granulocytes: 1 %
Lymphocytes Relative: 7 %
Lymphs Abs: 0.5 K/uL — ABNORMAL LOW (ref 0.7–4.0)
MCH: 27 pg (ref 26.0–34.0)
MCHC: 33.3 g/dL (ref 30.0–36.0)
MCV: 81.1 fL (ref 80.0–100.0)
Monocytes Absolute: 0.6 K/uL (ref 0.1–1.0)
Monocytes Relative: 8 %
Neutro Abs: 6.6 K/uL (ref 1.7–7.7)
Neutrophils Relative %: 84 %
Platelet Count: 169 K/uL (ref 150–400)
RBC: 4.18 MIL/uL (ref 3.87–5.11)
RDW: 14.5 % (ref 11.5–15.5)
WBC Count: 7.9 K/uL (ref 4.0–10.5)
nRBC: 0 % (ref 0.0–0.2)

## 2024-04-07 LAB — RAD ONC ARIA SESSION SUMMARY
Course Elapsed Days: 15
Plan Fractions Treated to Date: 10
Plan Prescribed Dose Per Fraction: 3 Gy
Plan Total Fractions Prescribed: 10
Plan Total Prescribed Dose: 30 Gy
Reference Point Dosage Given to Date: 30 Gy
Reference Point Session Dosage Given: 3 Gy
Session Number: 10

## 2024-04-07 LAB — CMP (CANCER CENTER ONLY)
ALT: 26 U/L (ref 0–44)
AST: 14 U/L — ABNORMAL LOW (ref 15–41)
Albumin: 3.9 g/dL (ref 3.5–5.0)
Alkaline Phosphatase: 72 U/L (ref 38–126)
Anion gap: 11 (ref 5–15)
BUN: 24 mg/dL — ABNORMAL HIGH (ref 8–23)
CO2: 23 mmol/L (ref 22–32)
Calcium: 9.3 mg/dL (ref 8.9–10.3)
Chloride: 102 mmol/L (ref 98–111)
Creatinine: 0.47 mg/dL (ref 0.44–1.00)
GFR, Estimated: >60 mL/min
Glucose, Bld: 80 mg/dL (ref 70–99)
Potassium: 3.9 mmol/L (ref 3.5–5.1)
Sodium: 136 mmol/L (ref 135–145)
Total Bilirubin: 0.3 mg/dL (ref 0.0–1.2)
Total Protein: 6.3 g/dL — ABNORMAL LOW (ref 6.5–8.1)

## 2024-04-07 MED ORDER — DEXAMETHASONE SOD PHOSPHATE PF 10 MG/ML IJ SOLN
10.0000 mg | Freq: Once | INTRAMUSCULAR | Status: AC
  Administered 2024-04-07: 10 mg via INTRAVENOUS

## 2024-04-07 MED ORDER — PALONOSETRON HCL INJECTION 0.25 MG/5ML
0.2500 mg | Freq: Once | INTRAVENOUS | Status: AC
  Administered 2024-04-07: 0.25 mg via INTRAVENOUS
  Filled 2024-04-07: qty 5

## 2024-04-07 MED ORDER — SODIUM CHLORIDE 0.9 % IV SOLN
536.0000 mg | Freq: Once | INTRAVENOUS | Status: AC
  Administered 2024-04-07: 540 mg via INTRAVENOUS
  Filled 2024-04-07: qty 54

## 2024-04-07 MED ORDER — SODIUM CHLORIDE 0.9 % IV SOLN
INTRAVENOUS | Status: DC
  Filled 2024-04-07: qty 250

## 2024-04-07 MED ORDER — APREPITANT 130 MG/18ML IV EMUL
130.0000 mg | Freq: Once | INTRAVENOUS | Status: AC
  Administered 2024-04-07: 130 mg via INTRAVENOUS
  Filled 2024-04-07: qty 18

## 2024-04-07 MED ORDER — SODIUM CHLORIDE 0.9 % IV SOLN
500.0000 mg/m2 | Freq: Once | INTRAVENOUS | Status: AC
  Administered 2024-04-07: 900 mg via INTRAVENOUS
  Filled 2024-04-07: qty 20

## 2024-04-07 NOTE — Progress Notes (Signed)
 " Hematology/Oncology Progress note [phone]) [phone] [phone]) [phone]       CHIEF COMPLAINTS/PURPOSE OF CONSULTATION:  Stage III lung adenocarcinoam  ASSESSMENT & PLAN:   Cancer Staging  Primary lung adenocarcinoma, right [facility]) Staging form: Lung, AJCC V9 - Clinical stage from 08/15/2023: Tanya Howell, cM0 - Signed by Babara Call, MD on 08/15/2023 - Clinical stage from 03/09/2024: Tanya Howell - Signed by Babara Call, MD on 03/09/2024   Primary lung adenocarcinoma, right [facility]) Images and pathology results were reviewed with the patient. cT2 N2 M0, Stage III right lung adenocarcinoma.  NGS showed TPS 5% CPS 10  KRAS G12C, TMB 12.6   Labs are reviewed and discussed with patient. Proceed with Durvalumab  Q 2weeks  02/13/24 CT scan showed  soft tissue thickening in the right infrahilar region and medial right lower lobe. New extensive airspace opacification in the right perihilar region and right paramediastinal lung in the right middle lobe and right lower lobe. new airspace opacity in the right lower lobe, new spiculated nodule in the right lower lobe 03/03/2024, PET scan findings are concerning for recurrent metastatic lung cancer. She has large hypermetabolic right iliac bone lesion, right lower lobe hypermetabolic lesion and left adrenal gland lesion.- obtain liquid NGS result is pending.. Recommend first-line treatment carboplatin /Alimta /Keytruda as first-line treatment of stage IV setting.  Second line treatment option consider Adagrasib  Labs reviewed and discussed with patient.  Proceed with cycle 2 carboplatin  and Alimta .  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    Encounter for antineoplastic immunotherapy Treatment plan as listed above  Metastasis to brain [facility]) MRI results were reviewed with patient. Patient has mild edema surrounding right frontal lobe lesion. Continue dexamethasone  4 mg twice daily I have discussed with Dr. Lenn  who will offer patient brain radiation.  Hold off Keytruda  Pulmonary embolism (HCC) Anticoagulation with Eliquis  5mg  BID   Right hip pain Pain is secondary to large right iliac bone mass.  Patient gets palliative radiation to right hip continue oxycodone  5 mg every 4-6 hours as needed for pain  Metastasis to bone (HCC) Bone strengthening medication-bisphosphonates.  Patient has almost denture.  Denies any jaw pain.  Recommend Zometa.  Rationale potential side effects were reviewed and discussed with patient.  She will get Zometa at her next visit.  Insomnia Likely secondary to steroid use, underlying anxiety associated with cancer diagnosis.  Recommend patient to utilize Xanax  prior to bedtime.  Also she may use over-the-counter melatonin 5 mg at bedtime, may increase to 10 mg if she tolerates.      No orders of the defined types were placed in this encounter.  Follow-up 3 weeks all questions were answered. The patient knows to call the clinic with any problems, questions or concerns.  Call Babara, MD, PhD [facility] Hematology Oncology 04/07/2024    HISTORY OF PRESENTING ILLNESS:  Tanya Howell 63 y.o. female presents to establish care for lung cancer  Oncology History  Primary lung adenocarcinoma, right (HCC)  08/03/2023 Imaging   CT angiogram chest PE protocol showed  1. 3.5 x 2.6 cm right hilar mass, with obstruction of the bronchus intermedius, right middle lobe bronchus, and portions of the right lower lobe bronchus as above, highly concerning for neoplasm. Bronchoscopy is recommended for further evaluation. 2. Subcarinal adenopathy concerning for metastatic disease. 3. Dense medial segment consolidation within the right middle lobe with associated volume loss, consistent with atelectasis. 4. Prominent right lower lobe bronchiectasis, with fluid-filled bronchi  and patchy right basilar consolidation consistent with combination of airspace disease and atelectasis. 5.  No evidence of pulmonary embolus. 6. Aortic Atherosclerosis (ICD10-I70.0). Coronary artery atherosclerosis.   08/06/2023 Initial Diagnosis   Primary lung adenocarcinoma, right (HCC)  08/03/2023, patient presented to emergency room for evaluation of hemoptysis. Patient had pneumonia in March 2025, she continues to have a lingering cough for the past months despite being treated with doxycycline  and prednisone . She coughed up small amount of bright red blood on 08/03/2023.  Denies any shortness of breath, chest pain unintentional weight loss headache.  She felt feverish with chills 2 nights ago. Patient is a former smoker, quit smoking in 2011. Patient denies being on any antiplatelet or anticoagulation agents.  08/08/2023 She underwent biopsy via bronchoscopy.  FINAL MICROSCOPIC DIAGNOSIS:  A. LUNG, RLL, BRUSHING:  - Adenocarcinoma   B. LUNG, RLL, FINE NEEDLE ASPIRATION:  - Adenocarcinoma   Immunohistochemical stains were performed to characterize the tumor cells. The cells are negative for TTF-1, Napsin A, p40, synaptophysin, Chromogranin A, and CD56.  Mucicarmine stain highlights intracytoplasmic mucin. Ki67 proliferation index is approximately 30%. The overall findings are supportive of the diagnosis of adenocarcinoma.   C. LYMPH NODE, 7, FINE NEEDLE ASPIRATION:  - Adenocarcinoma  - Lymphoid tissue present   D. LYMPH NODE, 4R, FINE NEEDLE ASPIRATION:  - No malignant cells identified  - Lymphoid tissue present   E. LYMPH NODE, 11R, FINE NEEDLE ASPIRATION:  - No malignant cells identified  - Lymphoid tissue present   F. LUNG, RLL, LAVAGE:  FINAL MICROSCOPIC DIAGNOSIS:  - Atypical cells present    08/15/2023 Cancer Staging   Staging form: Lung, AJCC V9 - Clinical stage from 08/15/2023: Tanya Howell, cM0 - Signed by Babara Call, MD on 08/15/2023 Stage prefix: Initial diagnosis Method of lymph node assessment: Clinical   09/09/2023 - 10/21/2023 Chemotherapy   Patient is on Treatment Plan :  LUNG Carboplatin  + Paclitaxel  + XRT q7d     11/22/2023 Imaging   CT chest with contrast   1. Diminished size subcarinal and right hilar node or mass, measuring 1.5 x 0.9 cm, previously 3.4 x 3.2 cm. Findings are consistent with treatment response. 2. No evidence of new or progressive disease in the chest. 3. Coronary artery disease   11/29/2023 - 02/21/2024 Chemotherapy   Patient is on Treatment Plan : LUNG Durvalumab  (10) q14d     02/17/2024 Imaging   CT chest w contrast  1. Significant interval change in appearance of the right lung as detailed above. This certainly could reflect radiation pneumonitis if the patient has received any new RT. Pneumonia or aspiration would be another consideration. I believe it is unlikely that this is recurrent tumor given the fact that the last CT showed a very good response to treatment and given its rapid onset and extensive appearance. However, that does remain a possibility. Recommend correlation with clinical findings of pneumonia and appropriate treatment. A short-term follow-up chest CT in 2-3 months may be helpful to reassess this process. 2. 12 mm spiculated appearing nodule in the right lower lobe laterally could be part of the same process. 3. No findings for her metastatic disease involving the upper abdomen or bony structures.   03/03/2024 Imaging   PET scan showed 1. New large lytic bone lesion with overlying cortical destruction in the right iliac bone measuring 5.0 x 5.3 cm with SUV max of 43.3, compatible with osseous metastasis. 2. New tracer-avid nodule within the left adrenal gland measuring 0.9 cm  with SUV max of 10.1, suspicious for metastatic disease. 3. Tracer-avid right lower lobe pulmonary nodule measuring 1.1 cm with SUV max of 9.5, increased from 0.8 cm on 11/22/2023 and slightly decreased from 1.3 cm on 02/13/2024, suspicious for active malignancy. 4. Interval resolution of the previous tracer avid right hilar mass  and adjacent subcarinal hypermetabolic lymph node. 5. Extensive right perihilar and paramediastinal fibrotic change compatible with post-radiation effect, with increased tracer uptake reflecting treatment-related inflammation.   03/09/2024 Cancer Staging   Staging form: Lung, AJCC V9 - Clinical stage from 03/09/2024: Tanya Banker, rcM1 - Signed by Babara Call, MD on 03/09/2024 Stage prefix: Recurrence Method of lymph node assessment: Clinical   03/16/2024 Imaging   MRI brain with and without contrast showed 1. New 1 cm enhancing lesion in the right frontal lobe with mild edema most compatible with a metastasis. 2. Mild chronic small vessel ischemic disease   03/17/2024 -  Chemotherapy   Patient is on Treatment Plan : LUNG Carboplatin  (5) + Pemetrexed  (500) + Pembrolizumab (200) D1 q21d Induction x 4 cycles / Maintenance Pemetrexed  (500) + Pembrolizumab (200) D1 q21d      09/23/2023 diagnosis of Segmental pulmonary emboli in the lingula. She takes Eliquis  5mg  BID She denies any cough, SOB chest pain, diarrhea.  Patient denies headache, focal weakness. Patient presents for evaluation of palliative chemotherapy.   She reports feeling well.  Denies nausea vomiting or diarrhea.  Right hip pain is manageable with current pain medication.  She has started on palliative radiation to right hip and there is plan for brain radiation  early January.  She takes dexamethasone  4 mg 2 times a day.  She tolerates well except some sleep disturbance  MEDICAL HISTORY:  Past Medical History:  Diagnosis Date   COVID-19 07/2018   Hyperlipidemia    Hypertension    Lung cancer (HCC) 07/2023   Motion sickness    amuesment park rides   Osteopenia 11/01/2015   Pneumonia    april 2025   Psoriasis    Vertigo    none for over 5 yrs   Wears dentures    full upper and lower    SURGICAL HISTORY: Past Surgical History:  Procedure Laterality Date   BRONCHIAL BIOPSY  08/08/2023   Procedure: BRONCHOSCOPY, WITH BIOPSY;   Surgeon: Malka Domino, MD;  Location: [facility] END[facility];  Service: Pulmonary;;   BRONCHIAL BRUSHINGS  08/08/2023   Procedure: BRONCHOSCOPY, WITH BRUSH BIOPSY;  Surgeon: Malka Domino, MD;  Location: [facility];  Service: Pulmonary;;   BRONCHIAL NEEDLE ASPIRATION BIOPSY  08/08/2023   Procedure: BRONCHOSCOPY, WITH NEEDLE ASPIRATION BIOPSY;  Surgeon: Malka Domino, MD;  Location: [facility];  Service: Pulmonary;;   CHOLECYSTECTOMY  1990   COLONOSCOPY WITH PROPOFOL  N/A 07/15/2020   Procedure: COLONOSCOPY WITH PROPOFOL ;  Surgeon: Jinny Carmine, MD;  Location: [facility];  Service: Endoscopy;  Laterality: N/A;  priority 4   ENDOBRONCHIAL ULTRASOUND Bilateral 08/08/2023   Procedure: ENDOBRONCHIAL ULTRASOUND (EBUS);  Surgeon: Malka Domino, MD;  Location: [facility] END[facility]COPY;  Service: Pulmonary;  Laterality: Bilateral;   IR IMAGING GUIDED PORT INSERTION  08/29/2023   TUBAL LIGATION      SOCIAL HISTORY: Social History   Socioeconomic History   Marital status: Married    Spouse name: Oneil   Number of children: 1   Years of education: Not on file   Highest education level: High school graduate  Occupational History   Not on file  Tobacco Use   Smoking status: Former  Current packs/day: 0.00    Average packs/day: 1 pack/day for 28.0 years (28.0 ttl pk-yrs)    Types: Cigarettes    Start date: 58    Quit date: 2011    Years since quitting: 15.0   Smokeless tobacco: Never   Tobacco comments:    Started smoking around 63 yrs old.    Smoked 1PPD at her heaviest.    Quit smoking in 2011- leda 08/07/2023  Vaping Use   Vaping status: Former  Substance and Sexual Activity   Alcohol use: No    Alcohol/week: 0.0 standard drinks of alcohol    Comment: rare   Drug use: No   Sexual activity: Yes    Birth control/protection: Surgical  Other Topics Concern   Not on file  Social History Narrative   Not on file   Social Drivers of Health   Tobacco Use: Medium Risk  (04/07/2024)   Patient History    Smoking Tobacco Use: Former    Smokeless Tobacco Use: Never    Passive Exposure: Not on Actuary Strain: Not on file  Food Insecurity: No Food Insecurity (09/24/2023)   Epic    Worried About Programme Researcher, Broadcasting/film/video in the Last Year: Never true    Ran Out of Food in the Last Year: Never true  Transportation Needs: No Transportation Needs (09/24/2023)   Epic    Lack of Transportation (Medical): No    Lack of Transportation (Non-Medical): No  Physical Activity: Insufficiently Active (02/11/2024)   Exercise Vital Sign    Days of Exercise per Week: 4 days    Minutes of Exercise per Session: 30 min  Stress: No Stress Concern Present (02/11/2024)   [employer] of Occupational Health - Occupational Stress Questionnaire    Feeling of Stress: Only a little  Social Connections: Moderately Isolated (09/24/2023)   Social Connection and Isolation Panel    Frequency of Communication with Friends and Family: More than three times a week    Frequency of Social Gatherings with Friends and Family: Twice a week    Attends Religious Services: Never    Database Administrator or Organizations: No    Attends Banker Meetings: Never    Marital Status: Married  Catering Manager Violence: Not At Risk (09/24/2023)   Epic    Fear of Current or Ex-Partner: No    Emotionally Abused: No    Physically Abused: No    Sexually Abused: No  Depression (PHQ2-9): Low Risk (04/07/2024)   Depression (PHQ2-9)    PHQ-2 Score: 0  Recent Concern: Depression (PHQ2-9) - Medium Risk (02/11/2024)   Depression (PHQ2-9)    PHQ-2 Score: 8  Alcohol Screen: Low Risk (02/11/2024)   Alcohol Screen    Last Alcohol Screening Score (AUDIT): 0  Housing: Low Risk (09/24/2023)   Epic    Unable to Pay for Housing in the Last Year: No    Number of Times Moved in the Last Year: 0    Homeless in the Last Year: No  Utilities: Not At Risk (09/24/2023)   Epic    Threatened with  loss of utilities: No  Health Literacy: Not on file    FAMILY HISTORY: Family History  Problem Relation Age of Onset   Colon cancer Brother        83    Breast cancer Mother    Breast cancer Maternal Aunt    Pneumonia Father    Diabetes Maternal Grandmother     ALLERGIES:  is allergic to lisinopril .  MEDICATIONS:  Current Outpatient Medications  Medication Sig Dispense Refill   ALPRAZolam  (XANAX ) 0.5 MG tablet Take 1 tablet (0.5 mg total) by mouth daily as needed for anxiety. 30 tablet 0   atorvastatin  (LIPITOR) 10 MG tablet Take 1 tablet (10 mg total) by mouth at bedtime. 90 tablet 0   cyclobenzaprine  (FLEXERIL ) 10 MG tablet Take 1 tablet (10 mg total) by mouth at bedtime. 30 tablet 0   dexamethasone  (DECADRON ) 4 MG tablet Take 1 tablet (4 mg total) by mouth 2 (two) times daily. 60 tablet 2   ELIQUIS  5 MG TABS tablet Take 1 tablet by mouth twice daily 60 tablet 0   folic acid  (FOLVITE ) 1 MG tablet Take 1 tablet (1 mg total) by mouth daily. Start 7 days before pemetrexed  chemotherapy. Continue until 21 days after pemetrexed  completed. 100 tablet 3   lansoprazole  (PREVACID ) 30 MG capsule Take 1 capsule (30 mg total) by mouth daily at 12 noon. 30 capsule 0   levalbuterol  (XOPENEX  HFA) 45 MCG/ACT inhaler Inhale 2 puffs into the lungs every 6 (six) hours as needed for wheezing. 15 g 3   lidocaine -prilocaine  (EMLA ) cream Apply to affected area once 30 g 3   losartan  (COZAAR ) 25 MG tablet Take 1 tablet (25 mg total) by mouth daily. 90 tablet 0   meclizine  (ANTIVERT ) 25 MG tablet Take 1 tablet (25 mg total) by mouth 3 (three) times daily as needed for dizziness. 30 tablet 3   nystatin  (MYCOSTATIN ) 100000 UNIT/ML suspension Take 5 mLs (500,000 Units total) by mouth 4 (four) times daily. 473 mL 2   ondansetron  (ZOFRAN ) 8 MG tablet Take 1 tablet (8 mg total) by mouth See admin instructions. Start after 72 hours after carboplatin  60 tablet 3   oxyCODONE  (OXY IR/ROXICODONE ) 5 MG immediate  release tablet Take 1 tablet (5 mg total) by mouth every 4 (four) hours as needed for severe pain (pain score 7-10). 45 tablet 0   prochlorperazine  (COMPAZINE ) 10 MG tablet Take 1 tablet (10 mg total) by mouth every 6 (six) hours as needed for nausea or vomiting. 60 tablet 3   triamcinolone  ointment (KENALOG ) 0.5 % Apply 1 Application topically 2 (two) times daily. 30 g 6   No current facility-administered medications for this visit.   Facility-Administered Medications Ordered in Other Visits  Medication Dose Route Frequency Provider Last Rate Last Admin   0.9 %  sodium chloride  infusion   Intravenous Continuous Babara Call, MD   Stopped at 04/07/24 1128    Review of Systems  Constitutional:  Negative for appetite change, chills, fatigue and fever.  HENT:   Negative for hearing loss and voice change.   Eyes:  Negative for eye problems.  Respiratory:  Negative for chest tightness, cough and hemoptysis.   Cardiovascular:  Negative for chest pain.  Gastrointestinal:  Negative for abdominal distention, abdominal pain and blood in stool.  Endocrine: Negative for hot flashes.  Genitourinary:  Negative for difficulty urinating and frequency.   Musculoskeletal:  Negative for arthralgias.       Right sciatic pain  Skin:  Negative for itching and rash.  Neurological:  Negative for extremity weakness.  Hematological:  Negative for adenopathy.  Psychiatric/Behavioral:  Positive for sleep disturbance. Negative for confusion.      PHYSICAL EXAMINATION: ECOG PERFORMANCE STATUS: 0 - Asymptomatic  Vitals:   04/07/24 0852 04/07/24 0901  BP: (!) 111/43 (!) 105/59  Pulse: 79   Resp: 18   Temp: (!) 96.9  F (36.1 C)   SpO2: 100%    Filed Weights   04/07/24 0852  Weight: 148 lb 9.6 oz (67.4 kg)    Physical Exam Constitutional:      General: She is not in acute distress.    Appearance: She is not diaphoretic.  HENT:     Head: Normocephalic and atraumatic.  Eyes:     General: No scleral  icterus. Cardiovascular:     Rate and Rhythm: Normal rate and regular rhythm.     Heart sounds: No murmur heard. Pulmonary:     Effort: Pulmonary effort is normal. No respiratory distress.     Breath sounds: No wheezing.  Abdominal:     General: There is no distension.     Palpations: Abdomen is soft.     Tenderness: There is no abdominal tenderness.  Musculoskeletal:        General: Normal range of motion.     Cervical back: Normal range of motion and neck supple.  Skin:    General: Skin is warm and dry.     Findings: No erythema.  Neurological:     Mental Status: She is alert and oriented to person, place, and time. Mental status is at baseline.     Motor: No abnormal muscle tone.  Psychiatric:        Mood and Affect: Affect normal.      LABORATORY DATA:  I have reviewed the data as listed    Latest Ref Rng & Units 04/07/2024    8:39 AM 03/24/2024    8:23 AM 03/17/2024   10:58 AM  CBC  WBC 4.0 - 10.5 K/uL 7.9  8.3  5.9   Hemoglobin 12.0 - 15.0 g/dL 88.6  86.5  88.2   Hematocrit 36.0 - 46.0 % 33.9  39.7  35.0   Platelets 150 - 400 K/uL 169  282  253       Latest Ref Rng & Units 04/07/2024    8:39 AM 03/24/2024    8:30 AM 03/17/2024   10:58 AM  CMP  Glucose 70 - 99 mg/dL 80  95  98   BUN 8 - 23 mg/dL 24  25  15    Creatinine 0.44 - 1.00 mg/dL 9.52  9.28  9.21   Sodium 135 - 145 mmol/L 136  135  138   Potassium 3.5 - 5.1 mmol/L 3.9  3.9  3.7   Chloride 98 - 111 mmol/L 102  99  102   CO2 22 - 32 mmol/L 23  23  23    Calcium  8.9 - 10.3 mg/dL 9.3  9.7  9.8   Total Protein 6.5 - 8.1 g/dL 6.3  7.1  6.8   Total Bilirubin 0.0 - 1.2 mg/dL 0.3  0.4  0.5   Alkaline Phos 38 - 126 U/L 72  100  108   AST 15 - 41 U/L 14  14  38   ALT 0 - 44 U/L 26  21  39      RADIOGRAPHIC STUDIES: I have personally reviewed the radiological images as listed and agreed with the findings in the report. MR Brain W Wo Contrast Result Date: 03/16/2024 EXAM: MRI BRAIN WITH AND WITHOUT CONTRAST  03/16/2024 08:33:37 AM TECHNIQUE: Multiplanar multisequence MRI of the head/brain was performed with and without the administration of intravenous contrast. COMPARISON: MR Head 08/13/2023. CLINICAL HISTORY: Lung cancer. FINDINGS: BRAIN AND VENTRICLES: No acute infarct, intracranial hemorrhage, midline shift, hydrocephalus, or extra-axial fluid collection is identified. Scattered small T2  hyperintensities in the cerebral white matter bilaterally are unchanged and nonspecific but compatible with mild chronic small vessel ischemic disease. Cerebral volume is within normal limits for age. A new solidly enhancing mass in the medial right frontal lobe measures 1.0 cm with associated mild vasogenic edema (series 18 image 118). No second enhancing brain lesion is identified. Major intracranial vascular flow voids are preserved. ORBITS: No acute abnormality. SINUSES: No acute abnormality. BONES AND SOFT TISSUES: Normal bone marrow signal and enhancement. No acute soft tissue abnormality. IMPRESSION: 1. New 1 cm enhancing lesion in the right frontal lobe with mild edema most compatible with a metastasis. 2. Mild chronic small vessel ischemic disease. Electronically signed by: Dasie Hamburg MD 03/16/2024 04:39 PM EST RP Workstation: [id number]     "

## 2024-04-07 NOTE — Assessment & Plan Note (Addendum)
 Images and pathology results were reviewed with the patient. cT2 N2 M0, Stage III right lung adenocarcinoma.  NGS showed TPS 5% CPS 10  KRAS G12C, TMB 12.6   Labs are reviewed and discussed with patient. Proceed with Durvalumab  Q 2weeks  02/13/24 CT scan showed  soft tissue thickening in the right infrahilar region and medial right lower lobe. New extensive airspace opacification in the right perihilar region and right paramediastinal lung in the right middle lobe and right lower lobe. new airspace opacity in the right lower lobe, new spiculated nodule in the right lower lobe 03/03/2024, PET scan findings are concerning for recurrent metastatic lung cancer. She has large hypermetabolic right iliac bone lesion, right lower lobe hypermetabolic lesion and left adrenal gland lesion.- obtain liquid NGS result is pending.. Recommend first-line treatment carboplatin /Alimta /Keytruda as first-line treatment of stage IV setting.  Second line treatment option consider Adagrasib  Labs reviewed and discussed with patient.  Proceed with cycle 2 carboplatin  and Alimta .  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

## 2024-04-07 NOTE — Assessment & Plan Note (Signed)
?   Anticoagulation with Eliquis 5 mg BID

## 2024-04-07 NOTE — Assessment & Plan Note (Signed)
 MRI results were reviewed with patient. Patient has mild edema surrounding right frontal lobe lesion. Continue dexamethasone  4 mg twice daily I have discussed with Dr. Lenn who will offer patient brain radiation.  Hold off Keytruda

## 2024-04-07 NOTE — Patient Instructions (Signed)
 [facility] - A DEPT OF [facility]  Discharge Instructions: Thank you for choosing [facility] to provide your oncology and hematology care.  If you have a lab appointment with the Cancer Center, please go directly to the Cancer Center and check in at the registration area.  Wear comfortable clothing and clothing appropriate for easy access to any Portacath or PICC line.   We strive to give you quality time with your provider. You may need to reschedule your appointment if you arrive late (15 or more minutes).  Arriving late affects you and other patients whose appointments are after yours.  Also, if you miss three or more appointments without notifying the office, you may be dismissed from the clinic at the provider's discretion.      For prescription refill requests, have your pharmacy contact our office and allow 72 hours for refills to be completed.    Today you received the following chemotherapy and/or immunotherapy agents alimta /carboplatin      To help prevent nausea and vomiting after your treatment, we encourage you to take your nausea medication as directed.  BELOW ARE SYMPTOMS THAT SHOULD BE REPORTED IMMEDIATELY: *FEVER GREATER THAN 100.4 F (38 C) OR HIGHER *CHILLS OR SWEATING *NAUSEA AND VOMITING THAT IS NOT CONTROLLED WITH YOUR NAUSEA MEDICATION *UNUSUAL SHORTNESS OF BREATH *UNUSUAL BRUISING OR BLEEDING *URINARY PROBLEMS (pain or burning when urinating, or frequent urination) *BOWEL PROBLEMS (unusual diarrhea, constipation, pain near the anus) TENDERNESS IN MOUTH AND THROAT WITH OR WITHOUT PRESENCE OF ULCERS (sore throat, sores in mouth, or a toothache) UNUSUAL RASH, SWELLING OR PAIN  UNUSUAL VAGINAL DISCHARGE OR ITCHING   Items with * indicate a potential emergency and should be followed up as soon as possible or go to the Emergency Department if any problems should occur.  Please show the CHEMOTHERAPY ALERT CARD or  IMMUNOTHERAPY ALERT CARD at check-in to the Emergency Department and triage nurse.  Should you have questions after your visit or need to cancel or reschedule your appointment, please contact [facility] - A DEPT OF [facility]  [phone] and follow the prompts.  Office hours are 8:00 a.m. to 4:30 p.m. Monday - Friday. Please note that voicemails left after 4:00 p.m. may not be returned until the following business day.  We are closed weekends and major holidays. You have access to a nurse at all times for urgent questions. Please call the main number to the clinic [phone] and follow the prompts.  For any non-urgent questions, you may also contact your provider using MyChart. We now offer e-Visits for anyone 34 and older to request care online for non-urgent symptoms. For details visit [email or web address].   Also download the MyChart app! Go to the app store, search MyChart, open the app, select [facility], and log in with your MyChart username and password.  Pemetrexed  Injection What is this medication? PEMETREXED  (PEM e TREX ed) treats some types of cancer. It works by slowing down the growth of cancer cells. This medicine may be used for other purposes; ask your health care provider or pharmacist if you have questions. COMMON BRAND NAME(S): Alimta , PEMFEXY, PEMRYDI RTU What should I tell my care team before I take this medication? They need to know if you have any of these conditions: Infection, such as chickenpox, cold sores, or herpes Kidney disease Low blood cell levels (white cells, red cells, and platelets) Lung or breathing disease,  such as asthma Radiation therapy An unusual or allergic reaction to pemetrexed , other medications, foods, dyes, or preservatives If you or your partner are pregnant or trying to get pregnant Breast-feeding How should I use this medication? This medication is injected into a vein. It is given by your care  team in a hospital or clinic setting. Talk to your care team about the use of this medication in children. Special care may be needed. Overdosage: If you think you have taken too much of this medicine contact a poison control center or emergency room at once. NOTE: This medicine is only for you. Do not share this medicine with others. What if I miss a dose? Keep appointments for follow-up doses. It is important not to miss your dose. Call your care team if you are unable to keep an appointment. What may interact with this medication? Do not take this medication with any of the following: Live virus vaccines This medication may also interact with the following: Ibuprofen This list may not describe all possible interactions. Give your health care provider a list of all the medicines, herbs, non-prescription drugs, or dietary supplements you use. Also tell them if you smoke, drink alcohol, or use illegal drugs. Some items may interact with your medicine. What should I watch for while using this medication? Your condition will be monitored carefully while you are receiving this medication. This medication may make you feel generally unwell. This is not uncommon as chemotherapy can affect healthy cells as well as cancer cells. Report any side effects. Continue your course of treatment even though you feel ill unless your care team tells you to stop. This medication can cause serious side effects. To reduce the risk, your care team may give you other medications to take before receiving this one. Be sure to follow the directions from your care team. This medication can cause a rash or redness in areas of the body that have previously had radiation therapy. If you have had radiation therapy, tell your care team if you notice a rash in this area. This medication may increase your risk of getting an infection. Call your care team for advice if you get a fever, chills, sore throat, or other symptoms of a cold  or flu. Do not treat yourself. Try to avoid being around people who are sick. Be careful brushing or flossing your teeth or using a toothpick because you may get an infection or bleed more easily. If you have any dental work done, tell your dentist you are receiving this medication. Avoid taking medications that contain aspirin, acetaminophen , ibuprofen, naproxen, or ketoprofen unless instructed by your care team. These medications may hide a fever. Check with your care team if you have severe diarrhea, nausea, and vomiting, or if you sweat a lot. The loss of too much body fluid may make it dangerous for you to take this medication. Talk to your care team if you or your partner wish to become pregnant or think either of you might be pregnant. This medication can cause serious birth defects if taken during pregnancy and for 6 months after the last dose. A negative pregnancy test is required before starting this medication. A reliable form of contraception is recommended while taking this medication and for 6 months after the last dose. Talk to your care team about reliable forms of contraception. Do not father a child while taking this medication and for 3 months after the last dose. Use a condom while having sex during  this time period. Do not breastfeed while taking this medication and for 1 week after the last dose. This medication may cause infertility. Talk to your care team if you are concerned about your fertility. What side effects may I notice from receiving this medication? Side effects that you should report to your care team as soon as possible: Allergic reactions--skin rash, itching, hives, swelling of the face, lips, tongue, or throat Dry cough, shortness of breath or trouble breathing Infection--fever, chills, cough, sore throat, wounds that don't heal, pain or trouble when passing urine, general feeling of discomfort or being unwell Kidney injury--decrease in the amount of urine, swelling  of the ankles, hands, or feet Low red blood cell level--unusual weakness or fatigue, dizziness, headache, trouble breathing Redness, blistering, peeling, or loosening of the skin, including inside the mouth Unusual bruising or bleeding Side effects that usually do not require medical attention (report to your care team if they continue or are bothersome): Fatigue Loss of appetite Nausea Vomiting This list may not describe all possible side effects. Call your doctor for medical advice about side effects. You may report side effects to FDA at 1-800-FDA-1088. Where should I keep my medication? This medication is given in a hospital or clinic. It will not be stored at home. NOTE: This sheet is a summary. It may not cover all possible information. If you have questions about this medicine, talk to your doctor, pharmacist, or health care provider.  2024 Elsevier/Gold Standard (2021-08-01 00:00:00) Carboplatin  Injection What is this medication? CARBOPLATIN  (KAR boe pla tin) treats some types of cancer. It works by slowing down the growth of cancer cells. This medicine may be used for other purposes; ask your health care provider or pharmacist if you have questions. COMMON BRAND NAME(S): Paraplatin  What should I tell my care team before I take this medication? They need to know if you have any of these conditions: Blood disorders Hearing problems Kidney disease Recent or ongoing radiation therapy An unusual or allergic reaction to carboplatin , cisplatin, other medications, foods, dyes, or preservatives Pregnant or trying to get pregnant Breast-feeding How should I use this medication? This medication is injected into a vein. It is given by your care team in a hospital or clinic setting. Talk to your care team about the use of this medication in children. Special care may be needed. Overdosage: If you think you have taken too much of this medicine contact a poison control center or emergency  room at once. NOTE: This medicine is only for you. Do not share this medicine with others. What if I miss a dose? Keep appointments for follow-up doses. It is important not to miss your dose. Call your care team if you are unable to keep an appointment. What may interact with this medication? Medications for seizures Some antibiotics, such as amikacin, gentamicin, neomycin, streptomycin, tobramycin Vaccines This list may not describe all possible interactions. Give your health care provider a list of all the medicines, herbs, non-prescription drugs, or dietary supplements you use. Also tell them if you smoke, drink alcohol, or use illegal drugs. Some items may interact with your medicine. What should I watch for while using this medication? Your condition will be monitored carefully while you are receiving this medication. You may need blood work while taking this medication. This medication may make you feel generally unwell. This is not uncommon, as chemotherapy can affect healthy cells as well as cancer cells. Report any side effects. Continue your course of treatment even though  you feel ill unless your care team tells you to stop. In some cases, you may be given additional medications to help with side effects. Follow all directions for their use. This medication may increase your risk of getting an infection. Call your care team for advice if you get a fever, chills, sore throat, or other symptoms of a cold or flu. Do not treat yourself. Try to avoid being around people who are sick. Avoid taking medications that contain aspirin, acetaminophen , ibuprofen, naproxen, or ketoprofen unless instructed by your care team. These medications may hide a fever. Be careful brushing or flossing your teeth or using a toothpick because you may get an infection or bleed more easily. If you have any dental work done, tell your dentist you are receiving this medication. Talk to your care team if you wish to  become pregnant or think you might be pregnant. This medication can cause serious birth defects. Talk to your care team about effective forms of contraception. Do not breast-feed while taking this medication. What side effects may I notice from receiving this medication? Side effects that you should report to your care team as soon as possible: Allergic reactions--skin rash, itching, hives, swelling of the face, lips, tongue, or throat Infection--fever, chills, cough, sore throat, wounds that don't heal, pain or trouble when passing urine, general feeling of discomfort or being unwell Low red blood cell level--unusual weakness or fatigue, dizziness, headache, trouble breathing Pain, tingling, or numbness in the hands or feet, muscle weakness, change in vision, confusion or trouble speaking, loss of balance or coordination, trouble walking, seizures Unusual bruising or bleeding Side effects that usually do not require medical attention (report to your care team if they continue or are bothersome): Hair loss Nausea Unusual weakness or fatigue Vomiting This list may not describe all possible side effects. Call your doctor for medical advice about side effects. You may report side effects to FDA at 1-800-FDA-1088. Where should I keep my medication? This medication is given in a hospital or clinic. It will not be stored at home. NOTE: This sheet is a summary. It may not cover all possible information. If you have questions about this medicine, talk to your doctor, pharmacist, or health care provider.  2024 Elsevier/Gold Standard (2021-07-18 00:00:00)

## 2024-04-07 NOTE — Assessment & Plan Note (Signed)
 Pain is secondary to large right iliac bone mass.  Patient gets palliative radiation to right hip continue oxycodone  5 mg every 4-6 hours as needed for pain

## 2024-04-07 NOTE — Assessment & Plan Note (Signed)
 Treatment plan as listed above.

## 2024-04-07 NOTE — Assessment & Plan Note (Signed)
 Likely secondary to steroid use, underlying anxiety associated with cancer diagnosis.  Recommend patient to utilize Xanax  prior to bedtime.  Also she may use over-the-counter melatonin 5 mg at bedtime, may increase to 10 mg if she tolerates.

## 2024-04-07 NOTE — Assessment & Plan Note (Signed)
 Bone strengthening medication-bisphosphonates.  Patient has almost denture.  Denies any jaw pain.  Recommend Zometa.  Rationale potential side effects were reviewed and discussed with patient.  She will get Zometa at her next visit.

## 2024-04-08 ENCOUNTER — Ambulatory Visit
Admission: RE | Admit: 2024-04-08 | Discharge: 2024-04-08 | Disposition: A | Discharge status: Home / Self Care | Source: Ambulatory Visit | Attending: Radiation Oncology | Admitting: Radiation Oncology

## 2024-04-08 DIAGNOSIS — C3491 Malignant neoplasm of unspecified part of right bronchus or lung: Secondary | ICD-10-CM | POA: Diagnosis present

## 2024-04-08 MED ORDER — GADOBUTROL 1 MMOL/ML IV SOLN
6.0000 mL | Freq: Once | INTRAVENOUS | Status: AC | PRN
  Administered 2024-04-08: 6 mL via INTRAVENOUS

## 2024-04-08 MED ORDER — HEPARIN SOD (PORK) LOCK FLUSH 100 UNIT/ML IV SOLN
500.0000 [IU] | Freq: Once | INTRAVENOUS | Status: AC
  Administered 2024-04-08: 500 [IU] via INTRAVENOUS
  Filled 2024-04-08: qty 5

## 2024-04-08 MED ORDER — HEPARIN SOD (PORK) LOCK FLUSH 100 UNIT/ML IV SOLN
INTRAVENOUS | Status: AC
  Filled 2024-04-08: qty 5

## 2024-04-08 NOTE — Radiation Completion Notes (Signed)
 Patient Name: Tanya Howell, Tanya Howell MRN: [id number] Date of Birth: March 26, 1961 Referring Physician: DUWAINE LOUDER, M.D. Date of Service: 2024-04-08 Radiation Oncologist: [name], M.D. 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  Patient had been on Durvalumab  although recent CT scan shows soft tissue tissue thickening the right infrahilar region and extensive air pastelike opacification the right perihilar region and right paramediastinal lung and a new spiculated nodule in the right lower lobe.  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  She is starting carboplatinum Alimta  and Keytruda under medical oncology's direction.  I been asked to evaluate her for right hip palliation.  She is ambulating with difficulty with a cane.  She is also on steroids as well as oxycodone .      ==========DELIVERED PLANS==========  First Treatment Date: 2024-03-23 Last Treatment Date: 2024-04-07   Plan Name: Pelvis_R Site: Hip, Right Technique: Isodose Plan Mode: Photon Dose Per Fraction: 3 Gy Prescribed Dose (Delivered / Prescribed): 30 Gy / 30 Gy Prescribed Fxs (Delivered / Prescribed): 10 / 10     ==========ON TREATMENT VISIT DATES========== 2024-03-24, 2024-03-31, 2024-04-07     ==========UPCOMING VISITS========== 06/18/2024 [facility]-CT IMAGING CT CHEST W CONTRAST [facility] 2  05/18/2024 [facility] RAD ONCOLOGY FOLLOW UP 30 Penton Marcey, MD  04/28/2024 [facility] MED ONC INFUSION [facility]-  MO INFUSION CHAIR 18  04/28/2024 [facility] MED ONC EST PT Babara Call, MD  04/28/2024 [facility] INF PORT FLUSH W/LAB [facility]PORT FLUSH  04/16/2024 [facility] RAD ONCOLOGY SRS TREATMENT [id number]  04/13/2024 [facility] RAD ONCOLOGY SRS CT SIMULATION [facility]RO [facility]  04/08/2024 [facility]-MRI MR BRAIN W WO CONTRAST [facility]MR 2        ==========APPENDIX - ON TREATMENT VISIT NOTES==========   See weekly On Treatment Notes in Epic for details in the Media tab (listed as Progress notes on the On Treatment Visit Dates listed above).

## 2024-04-11 ENCOUNTER — Other Ambulatory Visit: Payer: Self-pay | Admitting: Oncology

## 2024-04-13 ENCOUNTER — Encounter: Payer: Self-pay | Admitting: Oncology

## 2024-04-13 ENCOUNTER — Ambulatory Visit
Admission: RE | Admit: 2024-04-13 | Discharge: 2024-04-13 | Disposition: A | Discharge status: Home / Self Care | Source: Ambulatory Visit | Attending: Radiation Oncology | Admitting: Radiation Oncology

## 2024-04-13 DIAGNOSIS — Z87891 Personal history of nicotine dependence: Secondary | ICD-10-CM | POA: Diagnosis not present

## 2024-04-13 DIAGNOSIS — D509 Iron deficiency anemia, unspecified: Secondary | ICD-10-CM | POA: Insufficient documentation

## 2024-04-13 DIAGNOSIS — C7931 Secondary malignant neoplasm of brain: Secondary | ICD-10-CM | POA: Diagnosis not present

## 2024-04-13 DIAGNOSIS — Z803 Family history of malignant neoplasm of breast: Secondary | ICD-10-CM | POA: Diagnosis not present

## 2024-04-13 DIAGNOSIS — I2693 Single subsegmental pulmonary embolism without acute cor pulmonale: Secondary | ICD-10-CM | POA: Diagnosis not present

## 2024-04-13 DIAGNOSIS — D649 Anemia, unspecified: Secondary | ICD-10-CM | POA: Diagnosis not present

## 2024-04-13 DIAGNOSIS — C7951 Secondary malignant neoplasm of bone: Secondary | ICD-10-CM | POA: Insufficient documentation

## 2024-04-13 DIAGNOSIS — Z5112 Encounter for antineoplastic immunotherapy: Secondary | ICD-10-CM | POA: Insufficient documentation

## 2024-04-13 DIAGNOSIS — C3491 Malignant neoplasm of unspecified part of right bronchus or lung: Secondary | ICD-10-CM | POA: Diagnosis present

## 2024-04-13 DIAGNOSIS — M8580 Other specified disorders of bone density and structure, unspecified site: Secondary | ICD-10-CM | POA: Diagnosis not present

## 2024-04-13 DIAGNOSIS — Z800 Family history of malignant neoplasm of digestive organs: Secondary | ICD-10-CM | POA: Diagnosis not present

## 2024-04-16 ENCOUNTER — Ambulatory Visit
Admission: RE | Admit: 2024-04-16 | Discharge: 2024-04-16 | Disposition: A | Discharge status: Home / Self Care | Source: Ambulatory Visit | Attending: Radiation Oncology | Admitting: Radiation Oncology

## 2024-04-16 ENCOUNTER — Other Ambulatory Visit: Payer: Self-pay

## 2024-04-16 DIAGNOSIS — Z5112 Encounter for antineoplastic immunotherapy: Secondary | ICD-10-CM | POA: Diagnosis not present

## 2024-04-16 LAB — RAD ONC ARIA SESSION SUMMARY
Course Elapsed Days: 0
Plan Fractions Treated to Date: 1
Plan Prescribed Dose Per Fraction: 20 Gy
Plan Total Fractions Prescribed: 1
Plan Total Prescribed Dose: 20 Gy
Reference Point Dosage Given to Date: 20 Gy
Reference Point Session Dosage Given: 20 Gy
Session Number: 1

## 2024-04-17 NOTE — Radiation Completion Notes (Signed)
 Patient Name: Tanya Howell, Tanya Howell MRN: [id number] Date of Birth: [birth year] Referring Physician: DUWAINE LOUDER, M.D. Date of Service: 2024-04-17 Radiation Oncologist: [name], M.D. 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  Patient had been on Durvalumab  although recent CT scan shows soft tissue tissue thickening the right infrahilar region and extensive air pastelike opacification the right perihilar region and right paramediastinal lung and a new spiculated nodule in the right lower lobe.  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  She is starting carboplatinum Alimta  and Keytruda under medical oncology's direction.  I been asked to evaluate her for right hip palliation.  She is ambulating with difficulty with a cane.  She is also on steroids as well as oxycodone .      ==========DELIVERED PLANS==========  First Treatment Date: 2024-04-16 Last Treatment Date: 2024-04-16   Plan Name: Brain_SRS Site: Brain Technique: SBRT/SRT-IMRT Mode: Photon Dose Per Fraction: 20 Gy Prescribed Dose (Delivered / Prescribed): 20 Gy / 20 Gy Prescribed Fxs (Delivered / Prescribed): 1 / 1     ==========ON TREATMENT VISIT DATES========== 2024-04-16, 2024-04-16     ==========UPCOMING VISITS========== 06/18/2024 [facility] IMAG[facility] CT CHEST W CONTRAST [facility] 2  05/18/2024 [facility] RAD ONCOLOGY FOLLOW UP 30 Penton Marcey, MD  04/28/2024 [facility]C INFUSION [facility]- MO INFUSION CHAIR  18  04/28/2024 [facility] ONC EST PT Babara Call, MD  04/28/2024 [facility] INF PORT FLUSH W/LAB [facility]-PORT FLUSH        ==========APPENDIX - ON TREATMENT VISIT NOTES==========   See weekly On Treatment Notes in Epic for details in the Media tab (listed as Progress notes on the On Treatment Visit Dates listed above).

## 2024-04-20 ENCOUNTER — Encounter: Payer: Self-pay | Admitting: Oncology

## 2024-04-24 ENCOUNTER — Encounter: Payer: Self-pay | Admitting: Oncology

## 2024-04-28 ENCOUNTER — Inpatient Hospital Stay: Admitting: Oncology

## 2024-04-28 ENCOUNTER — Encounter: Payer: Self-pay | Admitting: Oncology

## 2024-04-28 ENCOUNTER — Inpatient Hospital Stay

## 2024-04-28 ENCOUNTER — Other Ambulatory Visit: Payer: Self-pay

## 2024-04-28 ENCOUNTER — Other Ambulatory Visit: Payer: Self-pay | Admitting: Oncology

## 2024-04-28 VITALS — BP 116/71 | HR 109

## 2024-04-28 VITALS — BP 114/67 | HR 126 | Temp 98.3°F | Resp 20 | Wt 145.7 lb

## 2024-04-28 DIAGNOSIS — I2693 Single subsegmental pulmonary embolism without acute cor pulmonale: Secondary | ICD-10-CM

## 2024-04-28 DIAGNOSIS — C3491 Malignant neoplasm of unspecified part of right bronchus or lung: Secondary | ICD-10-CM

## 2024-04-28 DIAGNOSIS — D649 Anemia, unspecified: Secondary | ICD-10-CM

## 2024-04-28 DIAGNOSIS — D509 Iron deficiency anemia, unspecified: Secondary | ICD-10-CM

## 2024-04-28 DIAGNOSIS — C7951 Secondary malignant neoplasm of bone: Secondary | ICD-10-CM

## 2024-04-28 DIAGNOSIS — C7931 Secondary malignant neoplasm of brain: Secondary | ICD-10-CM | POA: Diagnosis not present

## 2024-04-28 DIAGNOSIS — Z5112 Encounter for antineoplastic immunotherapy: Secondary | ICD-10-CM | POA: Diagnosis not present

## 2024-04-28 LAB — CBC WITH DIFFERENTIAL (CANCER CENTER ONLY)
Abs Immature Granulocytes: 0.11 K/uL — ABNORMAL HIGH (ref 0.00–0.07)
Basophils Absolute: 0 K/uL (ref 0.0–0.1)
Basophils Relative: 1 %
Eosinophils Absolute: 0 K/uL (ref 0.0–0.5)
Eosinophils Relative: 0 %
HCT: 25.6 % — ABNORMAL LOW (ref 36.0–46.0)
Hemoglobin: 8.4 g/dL — ABNORMAL LOW (ref 12.0–15.0)
Immature Granulocytes: 3 %
Lymphocytes Relative: 13 %
Lymphs Abs: 0.5 K/uL — ABNORMAL LOW (ref 0.7–4.0)
MCH: 27.2 pg (ref 26.0–34.0)
MCHC: 32.8 g/dL (ref 30.0–36.0)
MCV: 82.8 fL (ref 80.0–100.0)
Monocytes Absolute: 0.6 K/uL (ref 0.1–1.0)
Monocytes Relative: 16 %
Neutro Abs: 2.5 K/uL (ref 1.7–7.7)
Neutrophils Relative %: 67 %
Platelet Count: 255 K/uL (ref 150–400)
RBC: 3.09 MIL/uL — ABNORMAL LOW (ref 3.87–5.11)
RDW: 15 % (ref 11.5–15.5)
WBC Count: 3.8 K/uL — ABNORMAL LOW (ref 4.0–10.5)
nRBC: 0 % (ref 0.0–0.2)

## 2024-04-28 LAB — PREPARE RBC (CROSSMATCH)

## 2024-04-28 LAB — CMP (CANCER CENTER ONLY)
ALT: 45 U/L — ABNORMAL HIGH (ref 0–44)
AST: 22 U/L (ref 15–41)
Albumin: 3.6 g/dL (ref 3.5–5.0)
Alkaline Phosphatase: 98 U/L (ref 38–126)
Anion gap: 13 (ref 5–15)
BUN: 14 mg/dL (ref 8–23)
CO2: 24 mmol/L (ref 22–32)
Calcium: 9.4 mg/dL (ref 8.9–10.3)
Chloride: 103 mmol/L (ref 98–111)
Creatinine: 0.61 mg/dL (ref 0.44–1.00)
GFR, Estimated: >60 mL/min
Glucose, Bld: 129 mg/dL — ABNORMAL HIGH (ref 70–99)
Potassium: 3.5 mmol/L (ref 3.5–5.1)
Sodium: 141 mmol/L (ref 135–145)
Total Bilirubin: 0.3 mg/dL (ref 0.0–1.2)
Total Protein: 6.5 g/dL (ref 6.5–8.1)

## 2024-04-28 LAB — RETIC PANEL
Immature Retic Fract: 30.9 % — ABNORMAL HIGH (ref 2.3–15.9)
RBC.: 3.1 MIL/uL — ABNORMAL LOW (ref 3.87–5.11)
Retic Count, Absolute: 66.2 K/uL (ref 19.0–186.0)
Retic Ct Pct: 2.2 % (ref 0.4–3.1)
Reticulocyte Hemoglobin: 29.6 pg

## 2024-04-28 LAB — FERRITIN: Ferritin: 1675 ng/mL — ABNORMAL HIGH (ref 11–307)

## 2024-04-28 LAB — TSH: TSH: 2.08 u[IU]/mL (ref 0.350–4.500)

## 2024-04-28 LAB — IRON AND TIBC
Iron: 45 ug/dL (ref 28–170)
Saturation Ratios: 22 % (ref 10.4–31.8)
TIBC: 209 ug/dL — ABNORMAL LOW (ref 250–450)
UIBC: 163 ug/dL

## 2024-04-28 LAB — ABO/RH: ABO/RH(D): A NEG

## 2024-04-28 MED ORDER — CYANOCOBALAMIN 1000 MCG/ML IJ SOLN
1000.0000 ug | Freq: Once | INTRAMUSCULAR | Status: AC
  Administered 2024-04-28: 1000 ug via INTRAMUSCULAR
  Filled 2024-04-28: qty 1

## 2024-04-28 MED ORDER — SODIUM CHLORIDE 0.9 % IV SOLN
INTRAVENOUS | Status: DC
  Filled 2024-04-28: qty 250

## 2024-04-28 MED ORDER — SODIUM CHLORIDE 0.9 % IV SOLN
500.0000 mg/m2 | Freq: Once | INTRAVENOUS | Status: AC
  Administered 2024-04-28: 900 mg via INTRAVENOUS
  Filled 2024-04-28: qty 20

## 2024-04-28 MED ORDER — SODIUM CHLORIDE 0.9 % IV SOLN
536.0000 mg | Freq: Once | INTRAVENOUS | Status: AC
  Administered 2024-04-28: 540 mg via INTRAVENOUS
  Filled 2024-04-28: qty 54

## 2024-04-28 MED ORDER — ZOLEDRONIC ACID 4 MG/100ML IV SOLN
4.0000 mg | Freq: Once | INTRAVENOUS | Status: AC
  Administered 2024-04-28: 4 mg via INTRAVENOUS
  Filled 2024-04-28: qty 100

## 2024-04-28 MED ORDER — APREPITANT 130 MG/18ML IV EMUL
130.0000 mg | Freq: Once | INTRAVENOUS | Status: AC
  Administered 2024-04-28: 130 mg via INTRAVENOUS
  Filled 2024-04-28: qty 18

## 2024-04-28 MED ORDER — SODIUM CHLORIDE 0.9 % IV SOLN
200.0000 mg | Freq: Once | INTRAVENOUS | Status: AC
  Administered 2024-04-28: 200 mg via INTRAVENOUS
  Filled 2024-04-28: qty 8

## 2024-04-28 MED ORDER — DEXAMETHASONE SOD PHOSPHATE PF 10 MG/ML IJ SOLN
10.0000 mg | Freq: Once | INTRAMUSCULAR | Status: AC
  Administered 2024-04-28: 10 mg via INTRAVENOUS
  Filled 2024-04-28: qty 1

## 2024-04-28 MED ORDER — PALONOSETRON HCL INJECTION 0.25 MG/5ML
0.2500 mg | Freq: Once | INTRAVENOUS | Status: AC
  Administered 2024-04-28: 0.25 mg via INTRAVENOUS
  Filled 2024-04-28: qty 5

## 2024-04-28 NOTE — Assessment & Plan Note (Signed)
 Images and pathology results were reviewed with the patient. cT2 N2 M0, Stage III right lung adenocarcinoma.  NGS showed TPS 5% CPS 10  KRAS G12C, TMB 12.6   Labs are reviewed and discussed with patient. Proceed with Durvalumab  Q 2weeks  02/13/24 CT scan showed  soft tissue thickening in the right infrahilar region and medial right lower lobe. New extensive airspace opacification in the right perihilar region and right paramediastinal lung in the right middle lobe and right lower lobe. new airspace opacity in the right lower lobe, new spiculated nodule in the right lower lobe 03/03/2024, PET scan findings are concerning for recurrent metastatic lung cancer. She has large hypermetabolic right iliac bone lesion, right lower lobe hypermetabolic lesion and left adrenal gland lesion.- obtain liquid NGS result is pending.. Recommend first-line treatment carboplatin /Alimta /Keytruda  as first-line treatment of stage IV setting.  Second line treatment option consider Adagrasib  Labs reviewed and discussed with patient.  Proceed with cycle 3 carboplatin  and Alimta , add Keytruda 

## 2024-04-28 NOTE — Progress Notes (Signed)
 " Hematology/Oncology Progress note [phone] [phone]       CHIEF COMPLAINTS/PURPOSE OF CONSULTATION:  Stage III lung adenocarcinoam  ASSESSMENT & PLAN:   Cancer Staging  Primary lung adenocarcinoma, right [facility]) Staging form: Lung, AJCC V9 - Clinical stage from 08/15/2023: Tanya Howell, cM0 - Signed by Babara Call, MD on 08/15/2023 - Clinical stage from 03/09/2024: Tanya Howell - Signed by Babara Call, MD on 03/09/2024   Primary lung adenocarcinoma, right [facility]) Images and pathology results were reviewed with the patient. cT2 N2 M0, Stage III right lung adenocarcinoma.  NGS showed TPS 5% CPS 10  KRAS G12C, TMB 12.6   Labs are reviewed and discussed with patient. Proceed with Durvalumab  Q 2weeks  02/13/24 CT scan showed  soft tissue thickening in the right infrahilar region and medial right lower lobe. New extensive airspace opacification in the right perihilar region and right paramediastinal lung in the right middle lobe and right lower lobe. new airspace opacity in the right lower lobe, new spiculated nodule in the right lower lobe 03/03/2024, PET scan findings are concerning for recurrent metastatic lung cancer. She has large hypermetabolic right iliac bone lesion, right lower lobe hypermetabolic lesion and left adrenal gland lesion.- obtain liquid NGS result is pending.. Recommend first-line treatment carboplatin /Alimta /Keytruda  as first-line treatment of stage IV setting.  Second line treatment option consider Adagrasib  Labs reviewed and discussed with patient.  Proceed with cycle 3 carboplatin  and Alimta , add Keytruda     Encounter for antineoplastic immunotherapy Treatment plan as listed above  Pulmonary embolism (HCC) Anticoagulation with Eliquis  5mg  BID   Metastasis to brain [facility]) S/p SRS radiation on 04/16/2024 Finished tapering course of steroid.  Follow up with Radonc.   Metastasis to bone (HCC) Bone strengthening medication-bisphosphonates.  Patient  has almost denture.  Denies any jaw pain.  Recommend Zometa .  Rationale potential side effects were reviewed and discussed with patient.  She will get Zometa  today  Symptomatic anemia Recommend 1 unit of PRBC transfusion.  Check iron panel.       Orders Placed This Encounter  Procedures   Iron and TIBC    Standing Status:   Future    Number of Occurrences:   1    Expected Date:   04/28/2024    Expiration Date:   07/27/2024   Ferritin    Standing Status:   Future    Number of Occurrences:   1    Expected Date:   04/28/2024    Expiration Date:   07/27/2024   Retic Panel    Standing Status:   Future    Number of Occurrences:   1    Expected Date:   04/28/2024    Expiration Date:   07/27/2024   CBC with Differential (Cancer Center Only)    Standing Status:   Future    Expected Date:   05/05/2024    Expiration Date:   08/03/2024   Sample to Blood Bank    Standing Status:   Future    Expected Date:   05/05/2024    Expiration Date:   08/03/2024   Sample to Blood Bank    Standing Status:   Future    Number of Occurrences:   1    Expected Date:   04/28/2024    Expiration Date:   07/27/2024   ABO/Rh    Standing Status:   Future    Expected Date:   04/28/2024    Expiration Date:   07/27/2024   Follow-up  3 weeks  all questions were answered. The patient knows to call the clinic with any problems, questions or concerns.  Zelphia Cap, MD, PhD [facility] Hematology Oncology 04/28/2024    HISTORY OF PRESENTING ILLNESS:  Tanya Howell 64 y.o. female presents to establish care for lung cancer  Oncology History  Primary lung adenocarcinoma, right (HCC)  08/03/2023 Imaging   CT angiogram chest PE protocol showed  1. 3.5 x 2.6 cm right hilar mass, with obstruction of the bronchus intermedius, right middle lobe bronchus, and portions of the right lower lobe bronchus as above, highly concerning for neoplasm. Bronchoscopy is recommended for further evaluation. 2. Subcarinal adenopathy  concerning for metastatic disease. 3. Dense medial segment consolidation within the right middle lobe with associated volume loss, consistent with atelectasis. 4. Prominent right lower lobe bronchiectasis, with fluid-filled bronchi and patchy right basilar consolidation consistent with combination of airspace disease and atelectasis. 5. No evidence of pulmonary embolus. 6. Aortic Atherosclerosis (ICD10-I70.0). Coronary artery atherosclerosis.   08/06/2023 Initial Diagnosis   Primary lung adenocarcinoma, right (HCC)  08/03/2023, patient presented to emergency room for evaluation of hemoptysis. Patient had pneumonia in March 2025, she continues to have a lingering cough for the past months despite being treated with doxycycline  and prednisone . She coughed up small amount of bright red blood on 08/03/2023.  Denies any shortness of breath, chest pain unintentional weight loss headache.  She felt feverish with chills 2 nights ago. Patient is a former smoker, quit smoking in 2011. Patient denies being on any antiplatelet or anticoagulation agents.  08/08/2023 She underwent biopsy via bronchoscopy.  FINAL MICROSCOPIC DIAGNOSIS:  A. LUNG, RLL, BRUSHING:  - Adenocarcinoma   B. LUNG, RLL, FINE NEEDLE ASPIRATION:  - Adenocarcinoma   Immunohistochemical stains were performed to characterize the tumor cells. The cells are negative for TTF-1, Napsin A, p40, synaptophysin, Chromogranin A, and CD56.  Mucicarmine stain highlights intracytoplasmic mucin. Ki67 proliferation index is approximately 30%. The overall findings are supportive of the diagnosis of adenocarcinoma.   C. LYMPH NODE, 7, FINE NEEDLE ASPIRATION:  - Adenocarcinoma  - Lymphoid tissue present   D. LYMPH NODE, 4R, FINE NEEDLE ASPIRATION:  - No malignant cells identified  - Lymphoid tissue present   E. LYMPH NODE, 11R, FINE NEEDLE ASPIRATION:  - No malignant cells identified  - Lymphoid tissue present   F. LUNG, RLL, LAVAGE:   FINAL MICROSCOPIC DIAGNOSIS:  - Atypical cells present    08/15/2023 Cancer Staging   Staging form: Lung, AJCC V9 - Clinical stage from 08/15/2023: Tanya Howell, cM0 - Signed by Cap Zelphia, MD on 08/15/2023 Stage prefix: Initial diagnosis Method of lymph node assessment: Clinical   09/09/2023 - 10/21/2023 Chemotherapy   Patient is on Treatment Plan : LUNG Carboplatin  + Paclitaxel  + XRT q7d     11/22/2023 Imaging   CT chest with contrast   1. Diminished size subcarinal and right hilar node or mass, measuring 1.5 x 0.9 cm, previously 3.4 x 3.2 cm. Findings are consistent with treatment response. 2. No evidence of new or progressive disease in the chest. 3. Coronary artery disease   11/29/2023 - 02/21/2024 Chemotherapy   Patient is on Treatment Plan : LUNG Durvalumab  (10) q14d     02/17/2024 Imaging   CT chest w contrast  1. Significant interval change in appearance of the right lung as detailed above. This certainly could reflect radiation pneumonitis if the patient has received any new RT. Pneumonia or aspiration would be another consideration. I believe it  is unlikely that this is recurrent tumor given the fact that the last CT showed a very good response to treatment and given its rapid onset and extensive appearance. However, that does remain a possibility. Recommend correlation with clinical findings of pneumonia and appropriate treatment. A short-term follow-up chest CT in 2-3 months may be helpful to reassess this process. 2. 12 mm spiculated appearing nodule in the right lower lobe laterally could be part of the same process. 3. No findings for her metastatic disease involving the upper abdomen or bony structures.   03/03/2024 Imaging   PET scan showed 1. New large lytic bone lesion with overlying cortical destruction in the right iliac bone measuring 5.0 x 5.3 cm with SUV max of 43.3, compatible with osseous metastasis. 2. New tracer-avid nodule within the left adrenal gland measuring  0.9 cm with SUV max of 10.1, suspicious for metastatic disease. 3. Tracer-avid right lower lobe pulmonary nodule measuring 1.1 cm with SUV max of 9.5, increased from 0.8 cm on 11/22/2023 and slightly decreased from 1.3 cm on 02/13/2024, suspicious for active malignancy. 4. Interval resolution of the previous tracer avid right hilar mass and adjacent subcarinal hypermetabolic lymph node. 5. Extensive right perihilar and paramediastinal fibrotic change compatible with post-radiation effect, with increased tracer uptake reflecting treatment-related inflammation.   03/09/2024 Cancer Staging   Staging form: Lung, AJCC V9 - Clinical stage from 03/09/2024: Tanya Howell, rcM1 - Signed by Babara Call, MD on 03/09/2024 Stage prefix: Recurrence Method of lymph node assessment: Clinical   03/16/2024 Imaging   MRI brain with and without contrast showed 1. New 1 cm enhancing lesion in the right frontal lobe with mild edema most compatible with a metastasis. 2. Mild chronic small vessel ischemic disease   03/17/2024 -  Chemotherapy   Patient is on Treatment Plan : LUNG Carboplatin  (5) + Pemetrexed  (500) + Pembrolizumab  (200) D1 q21d Induction x 4 cycles / Maintenance Pemetrexed  (500) + Pembrolizumab  (200) D1 q21d      09/23/2023 diagnosis of Segmental pulmonary emboli in the lingula. She takes Eliquis  5mg  BID   Discussed the use of AI scribe software for clinical note transcription with the patient, who gave verbal consent to proceed.   She recently completed stereotactic radiosurgery to the brain on 04/16/24 and previously finished radiation therapy to the hip.  She completed a steroid taper course   Over the past several weeks, she has experienced persistent fatigue, which she attributes to the recent steroid taper and ongoing cancer treatments. She reports increased tiredness.  She denies current fever or chills but describes an episode of chills and feeling cold approximately three weeks ago, which resolved  by the next day. She has not had further infectious symptoms since then.  She notes a weight loss of approximately five pounds but reports improved appetite and taste since discontinuing steroids, and she is now eating better. She denies oral mucositis, diarrhea, and nausea.  She has noticed minor hematochezia, which she attributes to hemorrhoids and has experienced previously. No major rectal bleeding is present.   MEDICAL HISTORY:  Past Medical History:  Diagnosis Date   COVID-19 07/2018   Hyperlipidemia    Hypertension    Lung cancer (HCC) 07/2023   Motion sickness    amuesment park rides   Osteopenia 11/01/2015   Pneumonia    april 2025   Psoriasis    Vertigo    none for over 5 yrs   Wears dentures    full upper and lower  SURGICAL HISTORY: Past Surgical History:  Procedure Laterality Date   BRONCHIAL BIOPSY  08/08/2023   Procedure: BRONCHOSCOPY, WITH BIOPSY;  Surgeon: Malka Domino, MD;  Location: [facility];  Service: Pulmonary;;   BRONCHIAL BRUSHINGS  08/08/2023   Procedure: BRONCHOSCOPY, WITH BRUSH BIOPSY;  Surgeon: Malka Domino, MD;  Location: [facility] ENDOSCOPY;  Service: Pulmonary;;   BRONCHIAL NEEDLE ASPIRATION BIOPSY  08/08/2023   Procedure: BRONCHOSCOPY, WITH NEEDLE ASPIRATION BIOPSY;  Surgeon: Malka Domino, MD;  Location: [facility] ENDOSCOPY;  Service: Pulmonary;;   CHOLECYSTECTOMY  1990   COLONOSCOPY WITH PROPOFOL  N/A 07/15/2020   Procedure: COLONOSCOPY WITH PROPOFOL ;  Surgeon: Jinny Carmine, MD;  [facility];  Service: Endoscopy;  Laterality: N/A;  priority 4   ENDOBRONCHIAL ULTRASOUND Bilateral 08/08/2023   Procedure: ENDOBRONCHIAL ULTRASOUND (EBUS);  Surgeon: Malka Domino, MD;  Location: [facility] ENDOSCOPY;  Service: Pulmonary;  Laterality: Bilateral;   IR IMAGING GUIDED PORT INSERTION  08/29/2023   TUBAL LIGATION      SOCIAL HISTORY: Social History   Socioeconomic History   Marital status: Married    Spouse name: Oneil   Number  of children: 1   Years of education: Not on file   Highest education level: High school graduate  Occupational History   Not on file  Tobacco Use   Smoking status: Former    Current packs/day: 0.00    Average packs/day: 1 pack/day for 28.0 years (28.0 ttl pk-yrs)    Types: Cigarettes    Start date: 47    Quit date: 2011    Years since quitting: 15.0   Smokeless tobacco: Never   Tobacco comments:    Started smoking around 64 yrs old.    Smoked 1PPD at her heaviest.    Quit smoking in 2011- leda 08/07/2023  Vaping Use   Vaping status: Former  Substance and Sexual Activity   Alcohol use: No    Alcohol/week: 0.0 standard drinks of alcohol    Comment: rare   Drug use: No   Sexual activity: Yes    Birth control/protection: Surgical  Other Topics Concern   Not on file  Social History Narrative   Not on file   Social Drivers of Health   Tobacco Use: Medium Risk (04/28/2024)   Patient History    Smoking Tobacco Use: Former    Smokeless Tobacco Use: Never    Passive Exposure: Not on Actuary Strain: Not on file  Food Insecurity: No Food Insecurity (09/24/2023)   Epic    Worried About Programme Researcher, Broadcasting/film/video in the Last Year: Never true    Ran Out of Food in the Last Year: Never true  Transportation Needs: No Transportation Needs (09/24/2023)   Epic    Lack of Transportation (Medical): No    Lack of Transportation (Non-Medical): No  Physical Activity: Insufficiently Active (02/11/2024)   Exercise Vital Sign    Days of Exercise per Week: 4 days    Minutes of Exercise per Session: 30 min  Stress: No Stress Concern Present (02/11/2024)   [employer] of Occupational Health - Occupational Stress Questionnaire    Feeling of Stress: Only a little  Social Connections: Moderately Isolated (09/24/2023)   Social Connection and Isolation Panel    Frequency of Communication with Friends and Family: More than three times a week    Frequency of Social Gatherings with  Friends and Family: Twice a week    Attends Religious Services: Never    Database Administrator or Organizations: No  Attends Howell Meetings: Never    Marital Status: Married  Catering Manager Violence: Not At Risk (09/24/2023)   Epic    Fear of Current or Ex-Partner: No    Emotionally Abused: No    Physically Abused: No    Sexually Abused: No  Depression (PHQ2-9): Low Risk (04/07/2024)   Depression (PHQ2-9)    PHQ-2 Score: 0  Recent Concern: Depression (PHQ2-9) - Medium Risk (02/11/2024)   Depression (PHQ2-9)    PHQ-2 Score: 8  Alcohol Screen: Low Risk (02/11/2024)   Alcohol Screen    Last Alcohol Screening Score (AUDIT): 0  Housing: Low Risk (09/24/2023)   Epic    Unable to Pay for Housing in the Last Year: No    Number of Times Moved in the Last Year: 0    Homeless in the Last Year: No  Utilities: Not At Risk (09/24/2023)   Epic    Threatened with loss of utilities: No  Health Literacy: Not on file    FAMILY HISTORY: Family History  Problem Relation Age of Onset   Colon cancer Brother        34    Breast cancer Mother    Breast cancer Maternal Aunt    Pneumonia Father    Diabetes Maternal Grandmother     ALLERGIES:  is allergic to lisinopril .  MEDICATIONS:  Current Outpatient Medications  Medication Sig Dispense Refill   ALPRAZolam  (XANAX ) 0.5 MG tablet Take 1 tablet (0.5 mg total) by mouth daily as needed for anxiety. 30 tablet 0   atorvastatin  (LIPITOR) 10 MG tablet Take 1 tablet (10 mg total) by mouth at bedtime. 90 tablet 0   cyclobenzaprine  (FLEXERIL ) 10 MG tablet Take 1 tablet (10 mg total) by mouth at bedtime. 30 tablet 0   ELIQUIS  5 MG TABS tablet Take 1 tablet by mouth twice daily 60 tablet 0   folic acid  (FOLVITE ) 1 MG tablet Take 1 tablet (1 mg total) by mouth daily. Start 7 days before pemetrexed  chemotherapy. Continue until 21 days after pemetrexed  completed. 100 tablet 3   lansoprazole  (PREVACID ) 30 MG capsule Take 1 capsule (30 mg  total) by mouth daily at 12 noon. 30 capsule 0   levalbuterol  (XOPENEX  HFA) 45 MCG/ACT inhaler Inhale 2 puffs into the lungs every 6 (six) hours as needed for wheezing. 15 g 3   lidocaine -prilocaine  (EMLA ) cream Apply to affected area once 30 g 3   losartan  (COZAAR ) 25 MG tablet Take 1 tablet (25 mg total) by mouth daily. 90 tablet 0   meclizine  (ANTIVERT ) 25 MG tablet Take 1 tablet (25 mg total) by mouth 3 (three) times daily as needed for dizziness. 30 tablet 3   nystatin  (MYCOSTATIN ) 100000 UNIT/ML suspension Take 5 mLs (500,000 Units total) by mouth 4 (four) times daily. 473 mL 2   ondansetron  (ZOFRAN ) 8 MG tablet Take 1 tablet (8 mg total) by mouth See admin instructions. Start after 72 hours after carboplatin  60 tablet 3   oxyCODONE  (OXY IR/ROXICODONE ) 5 MG immediate release tablet Take 1 tablet (5 mg total) by mouth every 4 (four) hours as needed for severe pain (pain score 7-10). 45 tablet 0   prochlorperazine  (COMPAZINE ) 10 MG tablet Take 1 tablet (10 mg total) by mouth every 6 (six) hours as needed for nausea or vomiting. 60 tablet 3   triamcinolone  ointment (KENALOG ) 0.5 % Apply 1 Application topically 2 (two) times daily. 30 g 6   dexamethasone  (DECADRON ) 4 MG tablet Take 1 tablet (4 mg  total) by mouth 2 (two) times daily. (Patient not taking: Reported on 04/28/2024) 60 tablet 2   No current facility-administered medications for this visit.   Facility-Administered Medications Ordered in Other Visits  Medication Dose Route Frequency Provider Last Rate Last Admin   0.9 %  sodium chloride  infusion   Intravenous Continuous Babara Call, MD 10 mL/hr at 04/28/24 0922 New Bag at 04/28/24 9077   aprepitant  (CINVANTI ) injection 130 mg  130 mg Intravenous Once Iysis Germain, MD       CARBOplatin  (PARAPLATIN ) 540 mg in sodium chloride  0.9 % 250 mL chemo infusion  540 mg Intravenous Once Babara Call, MD       cyanocobalamin  (VITAMIN B12) injection 1,000 mcg  1,000 mcg Intramuscular Once Babara Call, MD        dexamethasone  (DECADRON ) injection 10 mg  10 mg Intravenous Once Noa Constante, MD       palonosetron  (ALOXI ) injection 0.25 mg  0.25 mg Intravenous Once Babara Call, MD       pembrolizumab  (KEYTRUDA ) 200 mg in sodium chloride  0.9 % 50 mL chemo infusion  200 mg Intravenous Once Kissie Ziolkowski, MD       PEMEtrexed  Disodium (ALIMTA ) 900 mg in sodium chloride  0.9 % 100 mL chemo infusion  500 mg/m2 (Order-Specific) Intravenous Once Jaret Coppedge, MD       Zoledronic  Acid (ZOMETA ) IVPB 4 mg  4 mg Intravenous Once Yui Mulvaney, MD        Review of Systems  Constitutional:  Positive for fatigue. Negative for appetite change, chills and fever.  HENT:   Negative for hearing loss and voice change.   Eyes:  Negative for eye problems.  Respiratory:  Negative for chest tightness, cough and hemoptysis.   Cardiovascular:  Negative for chest pain.  Gastrointestinal:  Positive for blood in stool. Negative for abdominal distention and abdominal pain.  Endocrine: Negative for hot flashes.  Genitourinary:  Negative for difficulty urinating and frequency.   Musculoskeletal:  Negative for arthralgias.       Right sciatic pain  Skin:  Negative for itching and rash.  Neurological:  Negative for extremity weakness.  Hematological:  Negative for adenopathy.  Psychiatric/Behavioral:  Positive for sleep disturbance. Negative for confusion.      PHYSICAL EXAMINATION: ECOG PERFORMANCE STATUS: 0 - Asymptomatic  Vitals:   04/28/24 0829  BP: 114/67  Pulse: (!) 126  Resp: 20  Temp: 98.3 F (36.8 C)  SpO2: 100%   Filed Weights   04/28/24 0829  Weight: 145 lb 11.2 oz (66.1 kg)    Physical Exam Constitutional:      General: She is not in acute distress.    Appearance: She is not diaphoretic.  HENT:     Head: Normocephalic and atraumatic.  Eyes:     General: No scleral icterus. Cardiovascular:     Rate and Rhythm: Normal rate and regular rhythm.     Heart sounds: No murmur heard. Pulmonary:     Effort: Pulmonary effort is  normal. No respiratory distress.     Breath sounds: No wheezing.  Abdominal:     General: There is no distension.     Palpations: Abdomen is soft.     Tenderness: There is no abdominal tenderness.  Musculoskeletal:        General: Normal range of motion.     Cervical back: Normal range of motion and neck supple.  Skin:    General: Skin is warm and dry.     Findings: No erythema.  Neurological:     Mental Status: She is alert and oriented to person, place, and time. Mental status is at baseline.     Motor: No abnormal muscle tone.  Psychiatric:        Mood and Affect: Affect normal.      LABORATORY DATA:  I have reviewed the data as listed    Latest Ref Rng & Units 04/28/2024    8:08 AM 04/07/2024    8:39 AM 03/24/2024    8:23 AM  CBC  WBC 4.0 - 10.5 K/uL 3.8  7.9  8.3   Hemoglobin 12.0 - 15.0 g/dL 8.4  88.6  86.5   Hematocrit 36.0 - 46.0 % 25.6  33.9  39.7   Platelets 150 - 400 K/uL 255  169  282       Latest Ref Rng & Units 04/28/2024    8:08 AM 04/07/2024    8:39 AM 03/24/2024    8:30 AM  CMP  Glucose 70 - 99 mg/dL 870  80  95   BUN 8 - 23 mg/dL 14  24  25    Creatinine 0.44 - 1.00 mg/dL 9.38  9.52  9.28   Sodium 135 - 145 mmol/L 141  136  135   Potassium 3.5 - 5.1 mmol/L 3.5  3.9  3.9   Chloride 98 - 111 mmol/L 103  102  99   CO2 22 - 32 mmol/L 24  23  23    Calcium  8.9 - 10.3 mg/dL 9.4  9.3  9.7   Total Protein 6.5 - 8.1 g/dL 6.5  6.3  7.1   Total Bilirubin 0.0 - 1.2 mg/dL 0.3  0.3  0.4   Alkaline Phos 38 - 126 U/L 98  72  100   AST 15 - 41 U/L 22  14  14    ALT 0 - 44 U/L 45  26  21      RADIOGRAPHIC STUDIES: I have personally reviewed the radiological images as listed and agreed with the findings in the report. MR Brain W Wo Contrast Result Date: 04/21/2024 EXAM: MRI BRAIN WITH AND WITHOUT CONTRAST 04/08/2024 08:46:57 AM TECHNIQUE: Multiplanar multisequence MRI of the head/brain was performed with and without the administration of intravenous contrast.  CONTRAST: 6 mL of Gadavist  COMPARISON: MR Head 03/16/2024. CLINICAL HISTORY: Metastatic lung cancer. FINDINGS: BRAIN AND VENTRICLES: A 1.1 cm enhancing lesion in the medial right frontal lobe has minimally enlarged by 1 mm (series 16 image 114). Mild surrounding edema has decreased, and there is no significant mass effect. No second enhancing intracranial lesion is identified. No acute infarct, intracranial hemorrhage, midline shift, hydrocephalus, or extra-axial fluid collection is evident. Scattered small T2 hyperintensities in the cerebral white matter bilaterally are unchanged and nonspecific but compatible with mild chronic small vessel ischemic disease. Major intracranial vascular flow voids are preserved. ORBITS: No acute abnormality. SINUSES: No acute abnormality. BONES AND SOFT TISSUES: Normal bone marrow signal and enhancement. No acute soft tissue abnormality. IMPRESSION: 1. 1.1 cm enhancing lesion in the right frontal lobe compatible with a metastasis, minimally increased in size from the previous study but with decreased, mild edema. 2. No evidence of new intracranial metastases. Electronically signed by: Dasie Hamburg MD MD 04/21/2024 11:57 AM EST RP Workstation: [id number]     "

## 2024-04-28 NOTE — Patient Instructions (Signed)
 [facility] - A DEPT OF [place]  Discharge Instructions: Thank you for choosing [facility] to provide your oncology and hematology care.  If you have a lab appointment with the Cancer Center, please go directly to the Cancer Center and check in at the registration area.  Wear comfortable clothing and clothing appropriate for easy access to any Portacath or PICC line.   We strive to give you quality time with your provider. You may need to reschedule your appointment if you arrive late (15 or more minutes).  Arriving late affects you and other patients whose appointments are after yours.  Also, if you miss three or more appointments without notifying the office, you may be dismissed from the clinic at the provider's discretion.      For prescription refill requests, have your pharmacy contact our office and allow 72 hours for refills to be completed.    Today you received the following chemotherapy and/or immunotherapy agents alimta /carboplatin      To help prevent nausea and vomiting after your treatment, we encourage you to take your nausea medication as directed.  BELOW ARE SYMPTOMS THAT SHOULD BE REPORTED IMMEDIATELY: *FEVER GREATER THAN 100.4 F (38 C) OR HIGHER *CHILLS OR SWEATING *NAUSEA AND VOMITING THAT IS NOT CONTROLLED WITH YOUR NAUSEA MEDICATION *UNUSUAL SHORTNESS OF BREATH *UNUSUAL BRUISING OR BLEEDING *URINARY PROBLEMS (pain or burning when urinating, or frequent urination) *BOWEL PROBLEMS (unusual diarrhea, constipation, pain near the anus) TENDERNESS IN MOUTH AND THROAT WITH OR WITHOUT PRESENCE OF ULCERS (sore throat, sores in mouth, or a toothache) UNUSUAL RASH, SWELLING OR PAIN  UNUSUAL VAGINAL DISCHARGE OR ITCHING   Items with * indicate a potential emergency and should be followed up as soon as possible or go to the Emergency Department if any problems should occur.  Please show the CHEMOTHERAPY ALERT CARD or  IMMUNOTHERAPY ALERT CARD at check-in to the Emergency Department and triage nurse.  Should you have questions after your visit or need to cancel or reschedule your appointment, please contact [facility] - A DEPT OF [facility]  [phone] and follow the prompts.  Office hours are 8:00 a.m. to 4:30 p.m. Monday - Friday. Please note that voicemails left after 4:00 p.m. may not be returned until the following business day.  We are closed weekends and major holidays. You have access to a nurse at all times for urgent questions. Please call the main number to the clinic [phone] and follow the prompts.  For any non-urgent questions, you may also contact your provider using MyChart. We now offer e-Visits for anyone 34 and older to request care online for non-urgent symptoms. For details visit [email or web address].   Also download the MyChart app! Go to the app store, search MyChart, open the app, select [facility], and log in with your MyChart username and password.  Pemetrexed  Injection What is this medication? PEMETREXED  (PEM e TREX ed) treats some types of cancer. It works by slowing down the growth of cancer cells. This medicine may be used for other purposes; ask your health care provider or pharmacist if you have questions. COMMON BRAND NAME(S): Alimta , PEMFEXY, PEMRYDI RTU What should I tell my care team before I take this medication? They need to know if you have any of these conditions: Infection, such as chickenpox, cold sores, or herpes Kidney disease Low blood cell levels (white cells, red cells, and platelets) Lung or breathing disease,  such as asthma Radiation therapy An unusual or allergic reaction to pemetrexed , other medications, foods, dyes, or preservatives If you or your partner are pregnant or trying to get pregnant Breast-feeding How should I use this medication? This medication is injected into a vein. It is given by your care  team in a hospital or clinic setting. Talk to your care team about the use of this medication in children. Special care may be needed. Overdosage: If you think you have taken too much of this medicine contact a poison control center or emergency room at once. NOTE: This medicine is only for you. Do not share this medicine with others. What if I miss a dose? Keep appointments for follow-up doses. It is important not to miss your dose. Call your care team if you are unable to keep an appointment. What may interact with this medication? Do not take this medication with any of the following: Live virus vaccines This medication may also interact with the following: Ibuprofen This list may not describe all possible interactions. Give your health care provider a list of all the medicines, herbs, non-prescription drugs, or dietary supplements you use. Also tell them if you smoke, drink alcohol, or use illegal drugs. Some items may interact with your medicine. What should I watch for while using this medication? Your condition will be monitored carefully while you are receiving this medication. This medication may make you feel generally unwell. This is not uncommon as chemotherapy can affect healthy cells as well as cancer cells. Report any side effects. Continue your course of treatment even though you feel ill unless your care team tells you to stop. This medication can cause serious side effects. To reduce the risk, your care team may give you other medications to take before receiving this one. Be sure to follow the directions from your care team. This medication can cause a rash or redness in areas of the body that have previously had radiation therapy. If you have had radiation therapy, tell your care team if you notice a rash in this area. This medication may increase your risk of getting an infection. Call your care team for advice if you get a fever, chills, sore throat, or other symptoms of a cold  or flu. Do not treat yourself. Try to avoid being around people who are sick. Be careful brushing or flossing your teeth or using a toothpick because you may get an infection or bleed more easily. If you have any dental work done, tell your dentist you are receiving this medication. Avoid taking medications that contain aspirin, acetaminophen , ibuprofen, naproxen, or ketoprofen unless instructed by your care team. These medications may hide a fever. Check with your care team if you have severe diarrhea, nausea, and vomiting, or if you sweat a lot. The loss of too much body fluid may make it dangerous for you to take this medication. Talk to your care team if you or your partner wish to become pregnant or think either of you might be pregnant. This medication can cause serious birth defects if taken during pregnancy and for 6 months after the last dose. A negative pregnancy test is required before starting this medication. A reliable form of contraception is recommended while taking this medication and for 6 months after the last dose. Talk to your care team about reliable forms of contraception. Do not father a child while taking this medication and for 3 months after the last dose. Use a condom while having sex during  this time period. Do not breastfeed while taking this medication and for 1 week after the last dose. This medication may cause infertility. Talk to your care team if you are concerned about your fertility. What side effects may I notice from receiving this medication? Side effects that you should report to your care team as soon as possible: Allergic reactions--skin rash, itching, hives, swelling of the face, lips, tongue, or throat Dry cough, shortness of breath or trouble breathing Infection--fever, chills, cough, sore throat, wounds that don't heal, pain or trouble when passing urine, general feeling of discomfort or being unwell Kidney injury--decrease in the amount of urine, swelling  of the ankles, hands, or feet Low red blood cell level--unusual weakness or fatigue, dizziness, headache, trouble breathing Redness, blistering, peeling, or loosening of the skin, including inside the mouth Unusual bruising or bleeding Side effects that usually do not require medical attention (report to your care team if they continue or are bothersome): Fatigue Loss of appetite Nausea Vomiting This list may not describe all possible side effects. Call your doctor for medical advice about side effects. You may report side effects to FDA at 1-800-FDA-1088. Where should I keep my medication? This medication is given in a hospital or clinic. It will not be stored at home. NOTE: This sheet is a summary. It may not cover all possible information. If you have questions about this medicine, talk to your doctor, pharmacist, or health care provider.  2024 Elsevier/Gold Standard (2021-08-01 00:00:00) Carboplatin  Injection What is this medication? CARBOPLATIN  (KAR boe pla tin) treats some types of cancer. It works by slowing down the growth of cancer cells. This medicine may be used for other purposes; ask your health care provider or pharmacist if you have questions. COMMON BRAND NAME(S): Paraplatin  What should I tell my care team before I take this medication? They need to know if you have any of these conditions: Blood disorders Hearing problems Kidney disease Recent or ongoing radiation therapy An unusual or allergic reaction to carboplatin , cisplatin, other medications, foods, dyes, or preservatives Pregnant or trying to get pregnant Breast-feeding How should I use this medication? This medication is injected into a vein. It is given by your care team in a hospital or clinic setting. Talk to your care team about the use of this medication in children. Special care may be needed. Overdosage: If you think you have taken too much of this medicine contact a poison control center or emergency  room at once. NOTE: This medicine is only for you. Do not share this medicine with others. What if I miss a dose? Keep appointments for follow-up doses. It is important not to miss your dose. Call your care team if you are unable to keep an appointment. What may interact with this medication? Medications for seizures Some antibiotics, such as amikacin, gentamicin, neomycin, streptomycin, tobramycin Vaccines This list may not describe all possible interactions. Give your health care provider a list of all the medicines, herbs, non-prescription drugs, or dietary supplements you use. Also tell them if you smoke, drink alcohol, or use illegal drugs. Some items may interact with your medicine. What should I watch for while using this medication? Your condition will be monitored carefully while you are receiving this medication. You may need blood work while taking this medication. This medication may make you feel generally unwell. This is not uncommon, as chemotherapy can affect healthy cells as well as cancer cells. Report any side effects. Continue your course of treatment even though  you feel ill unless your care team tells you to stop. In some cases, you may be given additional medications to help with side effects. Follow all directions for their use. This medication may increase your risk of getting an infection. Call your care team for advice if you get a fever, chills, sore throat, or other symptoms of a cold or flu. Do not treat yourself. Try to avoid being around people who are sick. Avoid taking medications that contain aspirin, acetaminophen , ibuprofen, naproxen, or ketoprofen unless instructed by your care team. These medications may hide a fever. Be careful brushing or flossing your teeth or using a toothpick because you may get an infection or bleed more easily. If you have any dental work done, tell your dentist you are receiving this medication. Talk to your care team if you wish to  become pregnant or think you might be pregnant. This medication can cause serious birth defects. Talk to your care team about effective forms of contraception. Do not breast-feed while taking this medication. What side effects may I notice from receiving this medication? Side effects that you should report to your care team as soon as possible: Allergic reactions--skin rash, itching, hives, swelling of the face, lips, tongue, or throat Infection--fever, chills, cough, sore throat, wounds that don't heal, pain or trouble when passing urine, general feeling of discomfort or being unwell Low red blood cell level--unusual weakness or fatigue, dizziness, headache, trouble breathing Pain, tingling, or numbness in the hands or feet, muscle weakness, change in vision, confusion or trouble speaking, loss of balance or coordination, trouble walking, seizures Unusual bruising or bleeding Side effects that usually do not require medical attention (report to your care team if they continue or are bothersome): Hair loss Nausea Unusual weakness or fatigue Vomiting This list may not describe all possible side effects. Call your doctor for medical advice about side effects. You may report side effects to FDA at 1-800-FDA-1088. Where should I keep my medication? This medication is given in a hospital or clinic. It will not be stored at home. NOTE: This sheet is a summary. It may not cover all possible information. If you have questions about this medicine, talk to your doctor, pharmacist, or health care provider.  2024 Elsevier/Gold Standard (2021-07-18 00:00:00)

## 2024-04-28 NOTE — Addendum Note (Signed)
 Addended by: BABARA CALL on: 04/28/2024 10:45 AM   Modules accepted: Orders

## 2024-04-28 NOTE — Assessment & Plan Note (Addendum)
 Recommend 1 unit of PRBC transfusion.  Check iron panel.

## 2024-04-28 NOTE — Assessment & Plan Note (Signed)
 S/p SRS radiation on 04/16/2024 Finished tapering course of steroid.  Follow up with Radonc.

## 2024-04-28 NOTE — Patient Instructions (Signed)

## 2024-04-28 NOTE — Progress Notes (Signed)
 Zometa  approved per [employer] 04/28/24

## 2024-04-28 NOTE — Assessment & Plan Note (Signed)
 Treatment plan as listed above.

## 2024-04-28 NOTE — Assessment & Plan Note (Signed)
 Bone strengthening medication-bisphosphonates.  Patient has almost denture.  Denies any jaw pain.  Recommend Zometa .  Rationale potential side effects were reviewed and discussed with patient.  She will get Zometa  today

## 2024-04-28 NOTE — Assessment & Plan Note (Signed)
?   Anticoagulation with Eliquis 5 mg BID

## 2024-04-29 ENCOUNTER — Inpatient Hospital Stay

## 2024-04-29 DIAGNOSIS — D649 Anemia, unspecified: Secondary | ICD-10-CM

## 2024-04-29 DIAGNOSIS — Z5112 Encounter for antineoplastic immunotherapy: Secondary | ICD-10-CM | POA: Diagnosis not present

## 2024-04-29 LAB — T4: T4, Total: 7 ug/dL (ref 4.5–12.0)

## 2024-04-29 MED ORDER — SODIUM CHLORIDE 0.9% IV SOLUTION
250.0000 mL | INTRAVENOUS | Status: DC
  Administered 2024-04-29: 100 mL via INTRAVENOUS
  Filled 2024-04-29: qty 250

## 2024-04-29 MED ORDER — ACETAMINOPHEN 325 MG PO TABS
650.0000 mg | ORAL_TABLET | Freq: Once | ORAL | Status: AC
  Administered 2024-04-29: 650 mg via ORAL
  Filled 2024-04-29: qty 2

## 2024-04-29 MED ORDER — DIPHENHYDRAMINE HCL 25 MG PO TABS
25.0000 mg | ORAL_TABLET | Freq: Once | ORAL | Status: AC
  Administered 2024-04-29: 25 mg via ORAL
  Filled 2024-04-29: qty 1

## 2024-04-29 NOTE — Patient Instructions (Signed)

## 2024-04-30 LAB — TYPE AND SCREEN
ABO/RH(D): A NEG
Antibody Screen: NEGATIVE
Unit division: 0

## 2024-04-30 LAB — BPAM RBC
Blood Product Expiration Date: 202601292359
ISSUE DATE / TIME: 202601210955
Unit Type and Rh: 202601292359
Unit Type and Rh: 600

## 2024-05-05 ENCOUNTER — Other Ambulatory Visit: Payer: Self-pay | Admitting: Oncology

## 2024-05-05 ENCOUNTER — Inpatient Hospital Stay

## 2024-05-05 ENCOUNTER — Telehealth: Payer: Self-pay | Admitting: *Deleted

## 2024-05-05 DIAGNOSIS — C3491 Malignant neoplasm of unspecified part of right bronchus or lung: Secondary | ICD-10-CM

## 2024-05-05 DIAGNOSIS — Z5112 Encounter for antineoplastic immunotherapy: Secondary | ICD-10-CM | POA: Diagnosis not present

## 2024-05-05 LAB — CBC WITH DIFFERENTIAL (CANCER CENTER ONLY)
Abs Immature Granulocytes: 0.02 10*3/uL (ref 0.00–0.07)
Basophils Absolute: 0 10*3/uL (ref 0.0–0.1)
Basophils Relative: 0 %
Eosinophils Absolute: 0 10*3/uL (ref 0.0–0.5)
Eosinophils Relative: 0 %
HCT: 30.1 % — ABNORMAL LOW (ref 36.0–46.0)
Hemoglobin: 10 g/dL — ABNORMAL LOW (ref 12.0–15.0)
Immature Granulocytes: 1 %
Lymphocytes Relative: 14 %
Lymphs Abs: 0.4 10*3/uL — ABNORMAL LOW (ref 0.7–4.0)
MCH: 26.7 pg (ref 26.0–34.0)
MCHC: 33.2 g/dL (ref 30.0–36.0)
MCV: 80.5 fL (ref 80.0–100.0)
Monocytes Absolute: 0.4 10*3/uL (ref 0.1–1.0)
Monocytes Relative: 16 %
Neutro Abs: 2 10*3/uL (ref 1.7–7.7)
Neutrophils Relative %: 69 %
Platelet Count: 223 10*3/uL (ref 150–400)
RBC: 3.74 MIL/uL — ABNORMAL LOW (ref 3.87–5.11)
RDW: 14.3 % (ref 11.5–15.5)
WBC Count: 2.8 10*3/uL — ABNORMAL LOW (ref 4.0–10.5)
nRBC: 0 % (ref 0.0–0.2)

## 2024-05-05 LAB — SAMPLE TO BLOOD BANK

## 2024-05-05 NOTE — Telephone Encounter (Signed)
 called pt and notified her that since hemoglobin is 10 she will not need a blood transfusion on 1/28. Also notified her that Dr. Babara reviewed Ferritin level and it is not life-threatening so she will further discuss at next visit. Pt verbalized understanding.

## 2024-05-05 NOTE — Telephone Encounter (Signed)
 Pt left message stating that she noticed her ferritin levels are elevated and wanted to know if she needed to be concerned or if she needed to do anything to help normalize her ferritin levels.   Please advise.

## 2024-05-06 ENCOUNTER — Inpatient Hospital Stay

## 2024-05-07 ENCOUNTER — Inpatient Hospital Stay

## 2024-05-07 ENCOUNTER — Ambulatory Visit: Admitting: Radiation Oncology

## 2024-05-13 ENCOUNTER — Other Ambulatory Visit: Payer: Self-pay | Admitting: Oncology

## 2024-05-18 ENCOUNTER — Ambulatory Visit: Payer: Self-pay | Admitting: Radiation Oncology

## 2024-05-19 ENCOUNTER — Inpatient Hospital Stay: Payer: Self-pay | Admitting: Oncology

## 2024-05-19 ENCOUNTER — Inpatient Hospital Stay: Payer: Self-pay

## 2024-05-19 ENCOUNTER — Inpatient Hospital Stay: Payer: Self-pay | Attending: Oncology

## 2024-05-28 ENCOUNTER — Ambulatory Visit: Admitting: Radiation Oncology

## 2024-06-18 ENCOUNTER — Other Ambulatory Visit

## 2024-06-25 ENCOUNTER — Ambulatory Visit: Admitting: Radiation Oncology

## 2024-08-11 ENCOUNTER — Ambulatory Visit: Admitting: Family Medicine
